# Patient Record
Sex: Female | Born: 1985
Health system: Southern US, Community
[De-identification: ages and names within clinical notes are randomized; demographics above are authoritative.]

## PROBLEM LIST (undated history)

## (undated) DIAGNOSIS — F32A Depression, unspecified: Secondary | ICD-10-CM

## (undated) DIAGNOSIS — J45909 Unspecified asthma, uncomplicated: Secondary | ICD-10-CM

## (undated) DIAGNOSIS — F419 Anxiety disorder, unspecified: Secondary | ICD-10-CM

## (undated) DIAGNOSIS — I10 Essential (primary) hypertension: Secondary | ICD-10-CM

## (undated) DIAGNOSIS — Z915 Personal history of self-harm: Secondary | ICD-10-CM

## (undated) DIAGNOSIS — K828 Other specified diseases of gallbladder: Secondary | ICD-10-CM

## (undated) DIAGNOSIS — R002 Palpitations: Secondary | ICD-10-CM

## (undated) DIAGNOSIS — F4310 Post-traumatic stress disorder, unspecified: Secondary | ICD-10-CM

## (undated) DIAGNOSIS — F909 Attention-deficit hyperactivity disorder, unspecified type: Secondary | ICD-10-CM

## (undated) DIAGNOSIS — F329 Major depressive disorder, single episode, unspecified: Secondary | ICD-10-CM

## (undated) HISTORY — DX: Essential (primary) hypertension: I10

## (undated) HISTORY — PX: EYE SURGERY: SHX253

---
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
## 2006-04-18 DIAGNOSIS — N883 Incompetence of cervix uteri: Secondary | ICD-10-CM

## 2006-10-13 ENCOUNTER — Ambulatory Visit: Payer: Self-pay | Admitting: *Deleted

## 2006-10-13 ENCOUNTER — Inpatient Hospital Stay (HOSPITAL_COMMUNITY): Admission: AD | Admit: 2006-10-13 | Discharge: 2006-10-13 | Payer: Self-pay | Admitting: Obstetrics & Gynecology

## 2006-10-26 ENCOUNTER — Ambulatory Visit: Payer: Self-pay | Admitting: Family Medicine

## 2006-11-09 ENCOUNTER — Ambulatory Visit: Payer: Self-pay | Admitting: Family Medicine

## 2006-11-13 ENCOUNTER — Ambulatory Visit: Payer: Self-pay | Admitting: Obstetrics and Gynecology

## 2006-11-13 ENCOUNTER — Inpatient Hospital Stay (HOSPITAL_COMMUNITY): Admission: AD | Admit: 2006-11-13 | Discharge: 2006-11-13 | Payer: Self-pay | Admitting: Obstetrics and Gynecology

## 2006-11-20 ENCOUNTER — Ambulatory Visit: Payer: Self-pay | Admitting: Obstetrics & Gynecology

## 2006-11-23 ENCOUNTER — Ambulatory Visit: Payer: Self-pay | Admitting: Family Medicine

## 2006-11-30 ENCOUNTER — Ambulatory Visit: Payer: Self-pay | Admitting: Family Medicine

## 2006-12-01 ENCOUNTER — Inpatient Hospital Stay (HOSPITAL_COMMUNITY): Admission: AD | Admit: 2006-12-01 | Discharge: 2006-12-01 | Payer: Self-pay | Admitting: Obstetrics and Gynecology

## 2006-12-01 ENCOUNTER — Ambulatory Visit: Payer: Self-pay | Admitting: *Deleted

## 2006-12-07 ENCOUNTER — Ambulatory Visit: Payer: Self-pay | Admitting: Obstetrics & Gynecology

## 2006-12-09 ENCOUNTER — Ambulatory Visit: Payer: Self-pay | Admitting: *Deleted

## 2006-12-09 ENCOUNTER — Inpatient Hospital Stay (HOSPITAL_COMMUNITY): Admission: AD | Admit: 2006-12-09 | Discharge: 2006-12-09 | Payer: Self-pay | Admitting: Obstetrics & Gynecology

## 2006-12-11 ENCOUNTER — Inpatient Hospital Stay (HOSPITAL_COMMUNITY): Admission: AD | Admit: 2006-12-11 | Discharge: 2006-12-14 | Payer: Self-pay | Admitting: Obstetrics & Gynecology

## 2006-12-12 ENCOUNTER — Ambulatory Visit: Payer: Self-pay | Admitting: *Deleted

## 2007-03-06 ENCOUNTER — Emergency Department (HOSPITAL_COMMUNITY): Admission: EM | Admit: 2007-03-06 | Discharge: 2007-03-06 | Payer: Self-pay | Admitting: *Deleted

## 2007-04-09 ENCOUNTER — Emergency Department (HOSPITAL_COMMUNITY): Admission: EM | Admit: 2007-04-09 | Discharge: 2007-04-10 | Payer: Self-pay | Admitting: Emergency Medicine

## 2007-07-07 ENCOUNTER — Inpatient Hospital Stay (HOSPITAL_COMMUNITY): Admission: AD | Admit: 2007-07-07 | Discharge: 2007-07-07 | Payer: Self-pay | Admitting: Obstetrics & Gynecology

## 2007-11-12 ENCOUNTER — Emergency Department (HOSPITAL_COMMUNITY): Admission: EM | Admit: 2007-11-12 | Discharge: 2007-11-12 | Payer: Self-pay | Admitting: Emergency Medicine

## 2008-06-26 ENCOUNTER — Emergency Department (HOSPITAL_COMMUNITY): Admission: EM | Admit: 2008-06-26 | Discharge: 2008-06-26 | Payer: Self-pay | Admitting: Emergency Medicine

## 2008-11-14 ENCOUNTER — Emergency Department (HOSPITAL_COMMUNITY): Admission: EM | Admit: 2008-11-14 | Discharge: 2008-11-14 | Payer: Self-pay | Admitting: Emergency Medicine

## 2008-12-20 ENCOUNTER — Emergency Department (HOSPITAL_COMMUNITY): Admission: EM | Admit: 2008-12-20 | Discharge: 2008-12-21 | Payer: Self-pay | Admitting: Emergency Medicine

## 2009-01-10 ENCOUNTER — Emergency Department (HOSPITAL_COMMUNITY): Admission: EM | Admit: 2009-01-10 | Discharge: 2009-01-11 | Payer: Self-pay | Admitting: Emergency Medicine

## 2009-01-13 DIAGNOSIS — J45909 Unspecified asthma, uncomplicated: Secondary | ICD-10-CM | POA: Insufficient documentation

## 2009-01-13 DIAGNOSIS — D649 Anemia, unspecified: Secondary | ICD-10-CM

## 2009-01-13 DIAGNOSIS — R55 Syncope and collapse: Secondary | ICD-10-CM | POA: Insufficient documentation

## 2009-01-13 DIAGNOSIS — F3289 Other specified depressive episodes: Secondary | ICD-10-CM | POA: Insufficient documentation

## 2009-01-13 DIAGNOSIS — F329 Major depressive disorder, single episode, unspecified: Secondary | ICD-10-CM | POA: Insufficient documentation

## 2009-01-13 DIAGNOSIS — F1994 Other psychoactive substance use, unspecified with psychoactive substance-induced mood disorder: Secondary | ICD-10-CM | POA: Insufficient documentation

## 2009-01-13 HISTORY — DX: Anemia, unspecified: D64.9

## 2009-01-13 HISTORY — DX: Unspecified asthma, uncomplicated: J45.909

## 2009-03-19 ENCOUNTER — Emergency Department (HOSPITAL_COMMUNITY): Admission: EM | Admit: 2009-03-19 | Discharge: 2009-03-19 | Payer: Self-pay | Admitting: Emergency Medicine

## 2009-07-23 ENCOUNTER — Inpatient Hospital Stay (HOSPITAL_COMMUNITY): Admission: EM | Admit: 2009-07-23 | Discharge: 2009-07-25 | Payer: Self-pay | Admitting: Emergency Medicine

## 2009-07-25 ENCOUNTER — Inpatient Hospital Stay (HOSPITAL_COMMUNITY): Admission: RE | Admit: 2009-07-25 | Discharge: 2009-07-31 | Payer: Self-pay | Admitting: Psychiatry

## 2009-07-25 ENCOUNTER — Ambulatory Visit: Payer: Self-pay | Admitting: Psychiatry

## 2009-11-02 ENCOUNTER — Emergency Department (HOSPITAL_COMMUNITY): Admission: EM | Admit: 2009-11-02 | Discharge: 2009-11-03 | Payer: Self-pay | Admitting: Emergency Medicine

## 2010-01-21 ENCOUNTER — Emergency Department (HOSPITAL_COMMUNITY): Admission: EM | Admit: 2010-01-21 | Discharge: 2010-01-21 | Payer: Self-pay | Admitting: Emergency Medicine

## 2010-07-03 LAB — POCT PREGNANCY, URINE: Preg Test, Ur: NEGATIVE

## 2010-07-06 LAB — VALPROIC ACID LEVEL: Valproic Acid Lvl: 63.2 ug/mL (ref 50.0–100.0)

## 2010-07-07 LAB — URINE MICROSCOPIC-ADD ON

## 2010-07-07 LAB — COMPREHENSIVE METABOLIC PANEL
ALT: 10 U/L (ref 0–35)
ALT: 11 U/L (ref 0–35)
AST: 12 U/L (ref 0–37)
AST: 19 U/L (ref 0–37)
Albumin: 2.9 g/dL — ABNORMAL LOW (ref 3.5–5.2)
Albumin: 3.7 g/dL (ref 3.5–5.2)
Alkaline Phosphatase: 43 U/L (ref 39–117)
Alkaline Phosphatase: 55 U/L (ref 39–117)
BUN: 7 mg/dL (ref 6–23)
BUN: 9 mg/dL (ref 6–23)
CO2: 23 mEq/L (ref 19–32)
CO2: 25 mEq/L (ref 19–32)
Calcium: 8.3 mg/dL — ABNORMAL LOW (ref 8.4–10.5)
Calcium: 9.2 mg/dL (ref 8.4–10.5)
Chloride: 107 mEq/L (ref 96–112)
Chloride: 109 mEq/L (ref 96–112)
Creatinine, Ser: 0.59 mg/dL (ref 0.4–1.2)
Creatinine, Ser: 0.59 mg/dL (ref 0.4–1.2)
GFR calc Af Amer: >60 mL/min (ref >60)
GFR calc Af Amer: >60 mL/min (ref >60)
GFR calc non Af Amer: >60 mL/min (ref >60)
GFR calc non Af Amer: >60 mL/min (ref >60)
Glucose, Bld: 88 mg/dL (ref 70–99)
Glucose, Bld: 93 mg/dL (ref 70–99)
Potassium: 4.2 mEq/L (ref 3.5–5.1)
Potassium: 4.7 mEq/L (ref 3.5–5.1)
Sodium: 137 mEq/L (ref 135–145)
Sodium: 138 mEq/L (ref 135–145)
Total Bilirubin: 0.4 mg/dL (ref 0.3–1.2)
Total Bilirubin: 0.6 mg/dL (ref 0.3–1.2)
Total Protein: 5.8 g/dL — ABNORMAL LOW (ref 6.0–8.3)
Total Protein: 7.4 g/dL (ref 6.0–8.3)

## 2010-07-07 LAB — DIFFERENTIAL
Basophils Absolute: 0 10*3/uL (ref 0.0–0.1)
Basophils Relative: 0 % (ref 0–1)
Eosinophils Absolute: 0.1 10*3/uL (ref 0.0–0.7)
Eosinophils Relative: 1 % (ref 0–5)
Lymphocytes Relative: 60 % — ABNORMAL HIGH (ref 12–46)
Lymphs Abs: 5.9 10*3/uL — ABNORMAL HIGH (ref 0.7–4.0)
Monocytes Absolute: 0.7 10*3/uL (ref 0.1–1.0)
Monocytes Relative: 7 % (ref 3–12)
Neutro Abs: 3.1 10*3/uL (ref 1.7–7.7)
Neutrophils Relative %: 32 % — ABNORMAL LOW (ref 43–77)

## 2010-07-07 LAB — URINALYSIS, ROUTINE W REFLEX MICROSCOPIC
Bilirubin Urine: NEGATIVE
Bilirubin Urine: NEGATIVE
Glucose, UA: NEGATIVE mg/dL
Glucose, UA: NEGATIVE mg/dL
Hgb urine dipstick: NEGATIVE
Hgb urine dipstick: NEGATIVE
Ketones, ur: NEGATIVE mg/dL
Ketones, ur: NEGATIVE mg/dL
Nitrite: NEGATIVE
Nitrite: NEGATIVE
Protein, ur: NEGATIVE mg/dL
Protein, ur: NEGATIVE mg/dL
Specific Gravity, Urine: 1.025 (ref 1.005–1.030)
Specific Gravity, Urine: 1.023 (ref 1.005–1.030)
Urobilinogen, UA: 1 mg/dL (ref 0.0–1.0)
Urobilinogen, UA: 0.2 mg/dL (ref 0.0–1.0)
pH: 7 (ref 5.0–8.0)
pH: 8 (ref 5.0–8.0)

## 2010-07-07 LAB — POCT PREGNANCY, URINE: Preg Test, Ur: NEGATIVE

## 2010-07-07 LAB — CBC
HCT: 35.3 % — ABNORMAL LOW (ref 36.0–46.0)
HCT: 34.9 % — ABNORMAL LOW (ref 36.0–46.0)
Hemoglobin: 11.4 g/dL — ABNORMAL LOW (ref 12.0–15.0)
Hemoglobin: 11.5 g/dL — ABNORMAL LOW (ref 12.0–15.0)
MCHC: 32.2 g/dL (ref 30.0–36.0)
MCHC: 32.8 g/dL (ref 30.0–36.0)
MCV: 87.3 fL (ref 78.0–100.0)
MCV: 86 fL (ref 78.0–100.0)
Platelets: 221 10*3/uL (ref 150–400)
Platelets: 202 10*3/uL (ref 150–400)
RBC: 4.04 MIL/uL (ref 3.87–5.11)
RBC: 4.06 MIL/uL (ref 3.87–5.11)
RDW: 13.3 % (ref 11.5–15.5)
RDW: 13.5 % (ref 11.5–15.5)
WBC: 9.9 10*3/uL (ref 4.0–10.5)
WBC: 9 10*3/uL (ref 4.0–10.5)

## 2010-07-07 LAB — LACTIC ACID, PLASMA: Lactic Acid, Venous: 1.3 mmol/L (ref 0.5–2.2)

## 2010-07-07 LAB — RAPID URINE DRUG SCREEN, HOSP PERFORMED
Amphetamines: NOT DETECTED
Barbiturates: NOT DETECTED
Benzodiazepines: NOT DETECTED
Cocaine: NOT DETECTED
Opiates: NOT DETECTED
Tetrahydrocannabinol: NOT DETECTED

## 2010-07-07 LAB — T4, FREE: Free T4: 1.09 ng/dL (ref 0.80–1.80)

## 2010-07-07 LAB — VALPROIC ACID LEVEL
Valproic Acid Lvl: 57.3 ug/mL (ref 50.0–100.0)
Valproic Acid Lvl: 63.5 ug/mL (ref 50.0–100.0)
Valproic Acid Lvl: 27.8 ug/mL — ABNORMAL LOW (ref 50.0–100.0)

## 2010-07-07 LAB — RPR: RPR Ser Ql: NONREACTIVE

## 2010-07-07 LAB — ACETAMINOPHEN LEVEL: Acetaminophen (Tylenol), Serum: <10 ug/mL — ABNORMAL LOW (ref 10–30)

## 2010-07-07 LAB — PREGNANCY, URINE: Preg Test, Ur: NEGATIVE

## 2010-07-07 LAB — CK: Total CK: 159 U/L (ref 7–177)

## 2010-07-07 LAB — TSH: TSH: 5.555 u[IU]/mL — ABNORMAL HIGH (ref 0.350–4.500)

## 2010-07-23 LAB — DIFFERENTIAL
Basophils Absolute: 0 10*3/uL (ref 0.0–0.1)
Basophils Absolute: 0 10*3/uL (ref 0.0–0.1)
Basophils Relative: 0 % (ref 0–1)
Basophils Relative: 0 % (ref 0–1)
Eosinophils Absolute: 0.3 10*3/uL (ref 0.0–0.7)
Eosinophils Absolute: 0.3 10*3/uL (ref 0.0–0.7)
Eosinophils Relative: 3 % (ref 0–5)
Eosinophils Relative: 4 % (ref 0–5)
Lymphocytes Relative: 52 % — ABNORMAL HIGH (ref 12–46)
Lymphocytes Relative: 52 % — ABNORMAL HIGH (ref 12–46)
Lymphs Abs: 5.1 10*3/uL — ABNORMAL HIGH (ref 0.7–4.0)
Lymphs Abs: 3.2 10*3/uL (ref 0.7–4.0)
Monocytes Absolute: 0.8 10*3/uL (ref 0.1–1.0)
Monocytes Absolute: 1 10*3/uL (ref 0.1–1.0)
Monocytes Relative: 8 % (ref 3–12)
Monocytes Relative: 16 % — ABNORMAL HIGH (ref 3–12)
Neutro Abs: 3.6 10*3/uL (ref 1.7–7.7)
Neutro Abs: 1.7 10*3/uL (ref 1.7–7.7)
Neutrophils Relative %: 36 % — ABNORMAL LOW (ref 43–77)
Neutrophils Relative %: 28 % — ABNORMAL LOW (ref 43–77)

## 2010-07-23 LAB — URINALYSIS, ROUTINE W REFLEX MICROSCOPIC
Bilirubin Urine: NEGATIVE
Glucose, UA: NEGATIVE mg/dL
Hgb urine dipstick: NEGATIVE
Ketones, ur: NEGATIVE mg/dL
Nitrite: NEGATIVE
Protein, ur: NEGATIVE mg/dL
Specific Gravity, Urine: 1.035 — ABNORMAL HIGH (ref 1.005–1.030)
Urobilinogen, UA: 1 mg/dL (ref 0.0–1.0)
pH: 6 (ref 5.0–8.0)

## 2010-07-23 LAB — HEMOCCULT GUIAC POC 1CARD (OFFICE)
Fecal Occult Bld: NEGATIVE
Fecal Occult Bld: POSITIVE

## 2010-07-23 LAB — POCT I-STAT, CHEM 8
BUN: 19 mg/dL (ref 6–23)
BUN: 18 mg/dL (ref 6–23)
Calcium, Ion: 1.08 mmol/L — ABNORMAL LOW (ref 1.12–1.32)
Calcium, Ion: 1.14 mmol/L (ref 1.12–1.32)
Chloride: 107 mEq/L (ref 96–112)
Chloride: 101 mEq/L (ref 96–112)
Creatinine, Ser: 0.7 mg/dL (ref 0.4–1.2)
Creatinine, Ser: 0.9 mg/dL (ref 0.4–1.2)
Glucose, Bld: 89 mg/dL (ref 70–99)
Glucose, Bld: 81 mg/dL (ref 70–99)
HCT: 36 % (ref 36.0–46.0)
HCT: 34 % — ABNORMAL LOW (ref 36.0–46.0)
Hemoglobin: 12.2 g/dL (ref 12.0–15.0)
Hemoglobin: 11.6 g/dL — ABNORMAL LOW (ref 12.0–15.0)
Potassium: 3.8 mEq/L (ref 3.5–5.1)
Potassium: 4.3 mEq/L (ref 3.5–5.1)
Sodium: 137 mEq/L (ref 135–145)
Sodium: 136 mEq/L (ref 135–145)
TCO2: 23 mmol/L (ref 0–100)
TCO2: 26 mmol/L (ref 0–100)

## 2010-07-23 LAB — TYPE AND SCREEN
ABO/RH(D): A POS
Antibody Screen: NEGATIVE

## 2010-07-23 LAB — CBC
HCT: 33.8 % — ABNORMAL LOW (ref 36.0–46.0)
HCT: 32.6 % — ABNORMAL LOW (ref 36.0–46.0)
Hemoglobin: 11.4 g/dL — ABNORMAL LOW (ref 12.0–15.0)
Hemoglobin: 10.8 g/dL — ABNORMAL LOW (ref 12.0–15.0)
MCHC: 33.8 g/dL (ref 30.0–36.0)
MCHC: 33 g/dL (ref 30.0–36.0)
MCV: 87.5 fL (ref 78.0–100.0)
MCV: 86.5 fL (ref 78.0–100.0)
Platelets: 175 10*3/uL (ref 150–400)
Platelets: 142 10*3/uL — ABNORMAL LOW (ref 150–400)
RBC: 3.86 MIL/uL — ABNORMAL LOW (ref 3.87–5.11)
RBC: 3.77 MIL/uL — ABNORMAL LOW (ref 3.87–5.11)
RDW: 14.2 % (ref 11.5–15.5)
RDW: 14.4 % (ref 11.5–15.5)
WBC: 9.8 10*3/uL (ref 4.0–10.5)
WBC: 6.1 10*3/uL (ref 4.0–10.5)

## 2010-07-23 LAB — ABO/RH: ABO/RH(D): A POS

## 2010-07-23 LAB — URINE MICROSCOPIC-ADD ON

## 2010-07-23 LAB — VALPROIC ACID LEVEL: Valproic Acid Lvl: 51.8 ug/mL (ref 50.0–100.0)

## 2010-07-23 LAB — POCT PREGNANCY, URINE: Preg Test, Ur: NEGATIVE
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
## 2011-01-10 LAB — POCT PREGNANCY, URINE
Operator id: 288861
Preg Test, Ur: NEGATIVE

## 2011-01-14 LAB — POCT I-STAT, CHEM 8
BUN: 9
Calcium, Ion: 1.15
Chloride: 105
Creatinine, Ser: 0.8
Glucose, Bld: 70
HCT: 38
Hemoglobin: 12.9
Potassium: 4.2
Sodium: 140
TCO2: 25

## 2011-01-14 LAB — URINALYSIS, ROUTINE W REFLEX MICROSCOPIC
Bilirubin Urine: NEGATIVE
Glucose, UA: NEGATIVE
Ketones, ur: NEGATIVE
Nitrite: NEGATIVE
Protein, ur: 30 — AB
Specific Gravity, Urine: 1.015
Urobilinogen, UA: 1
pH: 6

## 2011-01-14 LAB — WET PREP, GENITAL
Clue Cells Wet Prep HPF POC: NONE SEEN
Trich, Wet Prep: NONE SEEN
WBC, Wet Prep HPF POC: NONE SEEN
Yeast Wet Prep HPF POC: NONE SEEN

## 2011-01-14 LAB — URINE MICROSCOPIC-ADD ON

## 2011-01-14 LAB — GC/CHLAMYDIA PROBE AMP, GENITAL
Chlamydia, DNA Probe: NEGATIVE
GC Probe Amp, Genital: NEGATIVE

## 2011-01-14 LAB — POCT PREGNANCY, URINE: Preg Test, Ur: NEGATIVE

## 2011-01-21 LAB — I-STAT 8, (EC8 V) (CONVERTED LAB)
Acid-base deficit: 2
BUN: 12
Bicarbonate: 24.9 — ABNORMAL HIGH
Chloride: 108
Glucose, Bld: 74
HCT: 39
Hemoglobin: 13.3
Operator id: 257131
Potassium: 4.1
Sodium: 139
TCO2: 26
pCO2, Ven: 48.5
pH, Ven: 7.319 — ABNORMAL HIGH

## 2011-01-21 LAB — POCT I-STAT CREATININE
Creatinine, Ser: 0.8
Operator id: 257131

## 2011-01-21 LAB — RAPID STREP SCREEN (MED CTR MEBANE ONLY): Streptococcus, Group A Screen (Direct): NEGATIVE

## 2011-01-21 LAB — POCT PREGNANCY, URINE
Operator id: 257131
Preg Test, Ur: NEGATIVE

## 2011-01-28 LAB — POCT URINALYSIS DIP (DEVICE)
Bilirubin Urine: NEGATIVE
Glucose, UA: NEGATIVE
Hgb urine dipstick: NEGATIVE
Ketones, ur: NEGATIVE
Nitrite: NEGATIVE
Operator id: 297281
Specific Gravity, Urine: 1.025
Urobilinogen, UA: 0.2
pH: 6.5

## 2011-01-28 LAB — URINALYSIS, ROUTINE W REFLEX MICROSCOPIC
Bilirubin Urine: NEGATIVE
Bilirubin Urine: NEGATIVE
Bilirubin Urine: NEGATIVE
Glucose, UA: NEGATIVE
Glucose, UA: NEGATIVE
Glucose, UA: NEGATIVE
Hgb urine dipstick: NEGATIVE
Hgb urine dipstick: NEGATIVE
Hgb urine dipstick: NEGATIVE
Ketones, ur: 15 — AB
Ketones, ur: NEGATIVE
Ketones, ur: NEGATIVE
Nitrite: NEGATIVE
Nitrite: NEGATIVE
Nitrite: NEGATIVE
Protein, ur: NEGATIVE
Protein, ur: NEGATIVE
Protein, ur: NEGATIVE
Specific Gravity, Urine: 1.015
Specific Gravity, Urine: 1.025
Specific Gravity, Urine: <1.005 — ABNORMAL LOW
Urobilinogen, UA: 0.2
Urobilinogen, UA: 0.2
Urobilinogen, UA: 0.2
pH: 6
pH: 6.5
pH: 6.5

## 2011-01-28 LAB — CBC
HCT: 31.8 — ABNORMAL LOW
Hemoglobin: 10.5 — ABNORMAL LOW
MCHC: 33
MCV: 79.9
Platelets: 217
RBC: 3.98
RDW: 13.7
WBC: 8.7

## 2011-01-28 LAB — RPR: RPR Ser Ql: NONREACTIVE

## 2011-01-31 LAB — URINE MICROSCOPIC-ADD ON

## 2011-01-31 LAB — POCT URINALYSIS DIP (DEVICE)
Bilirubin Urine: NEGATIVE
Bilirubin Urine: NEGATIVE
Bilirubin Urine: NEGATIVE
Glucose, UA: NEGATIVE
Glucose, UA: NEGATIVE
Glucose, UA: NEGATIVE
Glucose, UA: NEGATIVE
Hgb urine dipstick: NEGATIVE
Hgb urine dipstick: NEGATIVE
Hgb urine dipstick: NEGATIVE
Hgb urine dipstick: NEGATIVE
Ketones, ur: NEGATIVE
Ketones, ur: NEGATIVE
Ketones, ur: NEGATIVE
Nitrite: NEGATIVE
Nitrite: NEGATIVE
Nitrite: NEGATIVE
Nitrite: NEGATIVE
Operator id: 120861
Operator id: 148111
Operator id: 148111
Operator id: 200901
Protein, ur: 30 — AB
Protein, ur: NEGATIVE
Protein, ur: NEGATIVE
Protein, ur: NEGATIVE
Specific Gravity, Urine: 1.015
Specific Gravity, Urine: 1.025
Specific Gravity, Urine: 1.025
Specific Gravity, Urine: 1.02
Urobilinogen, UA: 0.2
Urobilinogen, UA: 1
Urobilinogen, UA: 2 — ABNORMAL HIGH
Urobilinogen, UA: 1
pH: 6
pH: 7
pH: 7.5
pH: 7

## 2011-01-31 LAB — URINE CULTURE: Colony Count: 40000

## 2011-01-31 LAB — FETAL FIBRONECTIN: Fetal Fibronectin: NEGATIVE

## 2011-01-31 LAB — URINALYSIS, ROUTINE W REFLEX MICROSCOPIC
Bilirubin Urine: NEGATIVE
Glucose, UA: NEGATIVE
Hgb urine dipstick: NEGATIVE
Ketones, ur: NEGATIVE
Nitrite: NEGATIVE
Protein, ur: NEGATIVE
Specific Gravity, Urine: 1.025
Urobilinogen, UA: 0.2
pH: 6

## 2011-02-01 LAB — POCT URINALYSIS DIP (DEVICE)
Bilirubin Urine: NEGATIVE
Glucose, UA: NEGATIVE
Hgb urine dipstick: NEGATIVE
Ketones, ur: NEGATIVE
Nitrite: NEGATIVE
Operator id: 148111
Protein, ur: NEGATIVE
Specific Gravity, Urine: 1.02
Urobilinogen, UA: 1
pH: 7

## 2011-02-02 LAB — URINALYSIS, ROUTINE W REFLEX MICROSCOPIC
Bilirubin Urine: NEGATIVE
Glucose, UA: NEGATIVE
Hgb urine dipstick: NEGATIVE
Ketones, ur: NEGATIVE
Nitrite: NEGATIVE
Protein, ur: NEGATIVE
Specific Gravity, Urine: 1.01
Urobilinogen, UA: 0.2
pH: 7

## 2011-02-02 LAB — WET PREP, GENITAL
Trich, Wet Prep: NONE SEEN
Yeast Wet Prep HPF POC: NONE SEEN

## 2011-02-02 LAB — GC/CHLAMYDIA PROBE AMP, GENITAL
Chlamydia, DNA Probe: NEGATIVE
GC Probe Amp, Genital: NEGATIVE

## 2017-04-18 DIAGNOSIS — Z9151 Personal history of suicidal behavior: Secondary | ICD-10-CM

## 2017-04-18 HISTORY — DX: Personal history of suicidal behavior: Z91.51

## 2017-07-10 ENCOUNTER — Emergency Department (HOSPITAL_COMMUNITY)
Admission: EM | Admit: 2017-07-10 | Discharge: 2017-07-10 | Disposition: A | Discharge status: Home / Self Care | Payer: Medicaid Other | Attending: Emergency Medicine | Admitting: Emergency Medicine

## 2017-07-10 ENCOUNTER — Encounter (HOSPITAL_COMMUNITY): Payer: Self-pay | Admitting: Emergency Medicine

## 2017-07-10 ENCOUNTER — Other Ambulatory Visit: Payer: Self-pay

## 2017-07-10 DIAGNOSIS — R002 Palpitations: Secondary | ICD-10-CM | POA: Insufficient documentation

## 2017-07-10 DIAGNOSIS — Z5321 Procedure and treatment not carried out due to patient leaving prior to being seen by health care provider: Secondary | ICD-10-CM | POA: Diagnosis not present

## 2017-07-10 LAB — BASIC METABOLIC PANEL
Anion gap: 11 (ref 5–15)
BUN: 6 mg/dL (ref 6–20)
CO2: 22 mmol/L (ref 22–32)
Calcium: 9 mg/dL (ref 8.9–10.3)
Chloride: 104 mmol/L (ref 101–111)
Creatinine, Ser: 0.59 mg/dL (ref 0.44–1.00)
GFR calc Af Amer: >60 mL/min (ref >60)
GFR calc non Af Amer: >60 mL/min (ref >60)
Glucose, Bld: 95 mg/dL (ref 65–99)
Potassium: 3.5 mmol/L (ref 3.5–5.1)
Sodium: 137 mmol/L (ref 135–145)

## 2017-07-10 LAB — CBC
HCT: 35.7 % — ABNORMAL LOW (ref 36.0–46.0)
Hemoglobin: 11.7 g/dL — ABNORMAL LOW (ref 12.0–15.0)
MCH: 27.7 pg (ref 26.0–34.0)
MCHC: 32.8 g/dL (ref 30.0–36.0)
MCV: 84.4 fL (ref 78.0–100.0)
Platelets: 214 10*3/uL (ref 150–400)
RBC: 4.23 MIL/uL (ref 3.87–5.11)
RDW: 13.3 % (ref 11.5–15.5)
WBC: 5.4 10*3/uL (ref 4.0–10.5)

## 2017-07-10 LAB — I-STAT BETA HCG BLOOD, ED (MC, WL, AP ONLY): I-stat hCG, quantitative: <5 m[IU]/mL (ref <5)

## 2017-07-10 NOTE — ED Triage Notes (Signed)
Pt to ER reports palpitations and shortness of breath onset Friday, states generalized body aches and cough with cp since Saturday. In NAD.

## 2017-11-12 ENCOUNTER — Emergency Department (HOSPITAL_COMMUNITY)
Admission: EM | Admit: 2017-11-12 | Discharge: 2017-11-13 | Disposition: A | Discharge status: Other Acute Inpatient Hospital | Payer: Medicaid Other | Attending: Emergency Medicine | Admitting: Emergency Medicine

## 2017-11-12 ENCOUNTER — Encounter (HOSPITAL_COMMUNITY): Payer: Self-pay | Admitting: *Deleted

## 2017-11-12 ENCOUNTER — Other Ambulatory Visit: Payer: Self-pay

## 2017-11-12 DIAGNOSIS — F332 Major depressive disorder, recurrent severe without psychotic features: Secondary | ICD-10-CM | POA: Insufficient documentation

## 2017-11-12 DIAGNOSIS — J45909 Unspecified asthma, uncomplicated: Secondary | ICD-10-CM | POA: Insufficient documentation

## 2017-11-12 DIAGNOSIS — T43502A Poisoning by unspecified antipsychotics and neuroleptics, intentional self-harm, initial encounter: Secondary | ICD-10-CM | POA: Diagnosis present

## 2017-11-12 DIAGNOSIS — F909 Attention-deficit hyperactivity disorder, unspecified type: Secondary | ICD-10-CM | POA: Diagnosis not present

## 2017-11-12 HISTORY — DX: Major depressive disorder, single episode, unspecified: F32.9

## 2017-11-12 HISTORY — DX: Depression, unspecified: F32.A

## 2017-11-12 HISTORY — DX: Unspecified asthma, uncomplicated: J45.909

## 2017-11-12 HISTORY — DX: Attention-deficit hyperactivity disorder, unspecified type: F90.9

## 2017-11-12 LAB — COMPREHENSIVE METABOLIC PANEL
ALT: 11 U/L (ref 0–44)
AST: 13 U/L — ABNORMAL LOW (ref 15–41)
Albumin: 3 g/dL — ABNORMAL LOW (ref 3.5–5.0)
Alkaline Phosphatase: 35 U/L — ABNORMAL LOW (ref 38–126)
Anion gap: 8 (ref 5–15)
BUN: 9 mg/dL (ref 6–20)
CO2: 23 mmol/L (ref 22–32)
Calcium: 8.6 mg/dL — ABNORMAL LOW (ref 8.9–10.3)
Chloride: 114 mmol/L — ABNORMAL HIGH (ref 98–111)
Creatinine, Ser: 0.64 mg/dL (ref 0.44–1.00)
GFR calc Af Amer: >60 mL/min (ref >60)
GFR calc non Af Amer: >60 mL/min (ref >60)
Glucose, Bld: 125 mg/dL — ABNORMAL HIGH (ref 70–99)
Potassium: 3.8 mmol/L (ref 3.5–5.1)
Sodium: 145 mmol/L (ref 135–145)
Total Bilirubin: 0.3 mg/dL (ref 0.3–1.2)
Total Protein: 5.6 g/dL — ABNORMAL LOW (ref 6.5–8.1)

## 2017-11-12 LAB — RAPID URINE DRUG SCREEN, HOSP PERFORMED
Amphetamines: POSITIVE — AB
Barbiturates: NOT DETECTED
Benzodiazepines: NOT DETECTED
Cocaine: NOT DETECTED
Opiates: NOT DETECTED
Tetrahydrocannabinol: POSITIVE — AB

## 2017-11-12 LAB — SALICYLATE LEVEL: Salicylate Lvl: <7 mg/dL (ref 2.8–30.0)

## 2017-11-12 LAB — CBG MONITORING, ED: Glucose-Capillary: 127 mg/dL — ABNORMAL HIGH (ref 70–99)

## 2017-11-12 LAB — ACETAMINOPHEN LEVEL: Acetaminophen (Tylenol), Serum: <10 ug/mL — ABNORMAL LOW (ref 10–30)

## 2017-11-12 LAB — PREGNANCY, URINE: Preg Test, Ur: NEGATIVE

## 2017-11-12 LAB — ETHANOL: Alcohol, Ethyl (B): <10 mg/dL (ref <10)

## 2017-11-12 MED ORDER — FLUOXETINE HCL 20 MG PO CAPS
20.0000 mg | ORAL_CAPSULE | Freq: Every day | ORAL | Status: DC
  Administered 2017-11-13: 20 mg via ORAL
  Filled 2017-11-12: qty 1

## 2017-11-12 MED ORDER — AMLODIPINE BESYLATE 5 MG PO TABS
5.0000 mg | ORAL_TABLET | Freq: Every day | ORAL | Status: DC
  Administered 2017-11-13: 5 mg via ORAL
  Filled 2017-11-12: qty 1

## 2017-11-12 MED ORDER — MOMETASONE FURO-FORMOTEROL FUM 100-5 MCG/ACT IN AERO
2.0000 | INHALATION_SPRAY | Freq: Two times a day (BID) | RESPIRATORY_TRACT | Status: DC
  Administered 2017-11-13: 2 via RESPIRATORY_TRACT
  Filled 2017-11-12: qty 8.8

## 2017-11-12 MED ORDER — AMPHETAMINE-DEXTROAMPHETAMINE 20 MG PO TABS: 20.0000 mg | ORAL_TABLET | Freq: Every day | ORAL | Status: DC

## 2017-11-12 NOTE — ED Notes (Signed)
Bed: RESA Expected date:  Expected time:  Means of arrival:  Comments:  od 

## 2017-11-12 NOTE — ED Triage Notes (Signed)
EMS reports pt took Seroquel in attempt to kill self after her boyfriend broke up with her and suggested she kill herself. Pt responds to painful stimuli. Unknown time of ingestion and unknown amount

## 2017-11-12 NOTE — ED Notes (Signed)
[facility] Expected date:  Expected time:  Means of arrival:  Comments: Room 4
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
## 2017-11-12 NOTE — ED Notes (Signed)
[facility] [employer]- Arlene [phone]

## 2017-11-12 NOTE — ED Notes (Signed)
Poison control recommends 6 hr monitoring with EKG around 10 pm to access for QT changes. Poison control to call back to obtain lab results and pt status. Dr Denton LankSteinl made aware of recommendations

## 2017-11-13 ENCOUNTER — Inpatient Hospital Stay (HOSPITAL_COMMUNITY)
Admission: AD | Admit: 2017-11-13 | Discharge: 2017-11-16 | DRG: 885 | Disposition: A | Discharge status: Home / Self Care | Payer: Medicaid Other | Source: Intra-hospital | Attending: Psychiatry | Admitting: Psychiatry

## 2017-11-13 ENCOUNTER — Other Ambulatory Visit: Payer: Self-pay

## 2017-11-13 ENCOUNTER — Encounter (HOSPITAL_COMMUNITY): Payer: Self-pay

## 2017-11-13 DIAGNOSIS — F329 Major depressive disorder, single episode, unspecified: Secondary | ICD-10-CM | POA: Diagnosis present

## 2017-11-13 DIAGNOSIS — R45851 Suicidal ideations: Secondary | ICD-10-CM | POA: Diagnosis present

## 2017-11-13 DIAGNOSIS — Z91013 Allergy to seafood: Secondary | ICD-10-CM

## 2017-11-13 DIAGNOSIS — F332 Major depressive disorder, recurrent severe without psychotic features: Principal | ICD-10-CM | POA: Diagnosis present

## 2017-11-13 DIAGNOSIS — Z91410 Personal history of adult physical and sexual abuse: Secondary | ICD-10-CM | POA: Diagnosis not present

## 2017-11-13 DIAGNOSIS — J45909 Unspecified asthma, uncomplicated: Secondary | ICD-10-CM | POA: Diagnosis present

## 2017-11-13 DIAGNOSIS — F17210 Nicotine dependence, cigarettes, uncomplicated: Secondary | ICD-10-CM | POA: Diagnosis not present

## 2017-11-13 DIAGNOSIS — F419 Anxiety disorder, unspecified: Secondary | ICD-10-CM | POA: Diagnosis not present

## 2017-11-13 DIAGNOSIS — F1290 Cannabis use, unspecified, uncomplicated: Secondary | ICD-10-CM | POA: Diagnosis present

## 2017-11-13 DIAGNOSIS — T43502A Poisoning by unspecified antipsychotics and neuroleptics, intentional self-harm, initial encounter: Secondary | ICD-10-CM | POA: Diagnosis not present

## 2017-11-13 DIAGNOSIS — F4310 Post-traumatic stress disorder, unspecified: Secondary | ICD-10-CM | POA: Diagnosis present

## 2017-11-13 DIAGNOSIS — Z818 Family history of other mental and behavioral disorders: Secondary | ICD-10-CM

## 2017-11-13 DIAGNOSIS — F1994 Other psychoactive substance use, unspecified with psychoactive substance-induced mood disorder: Secondary | ICD-10-CM | POA: Diagnosis present

## 2017-11-13 DIAGNOSIS — F909 Attention-deficit hyperactivity disorder, unspecified type: Secondary | ICD-10-CM | POA: Diagnosis present

## 2017-11-13 DIAGNOSIS — Z62810 Personal history of physical and sexual abuse in childhood: Secondary | ICD-10-CM | POA: Diagnosis not present

## 2017-11-13 DIAGNOSIS — T43592A Poisoning by other antipsychotics and neuroleptics, intentional self-harm, initial encounter: Secondary | ICD-10-CM | POA: Diagnosis not present

## 2017-11-13 DIAGNOSIS — F69 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: Secondary | ICD-10-CM

## 2017-11-13 DIAGNOSIS — Z7951 Long term (current) use of inhaled steroids: Secondary | ICD-10-CM | POA: Diagnosis not present

## 2017-11-13 DIAGNOSIS — G4700 Insomnia, unspecified: Secondary | ICD-10-CM | POA: Diagnosis present

## 2017-11-13 DIAGNOSIS — F17200 Nicotine dependence, unspecified, uncomplicated: Secondary | ICD-10-CM | POA: Diagnosis not present

## 2017-11-13 DIAGNOSIS — Z79899 Other long term (current) drug therapy: Secondary | ICD-10-CM | POA: Diagnosis not present

## 2017-11-13 DIAGNOSIS — F3181 Bipolar II disorder: Secondary | ICD-10-CM | POA: Diagnosis not present

## 2017-11-13 DIAGNOSIS — Z915 Personal history of self-harm: Secondary | ICD-10-CM

## 2017-11-13 MED ORDER — MAGNESIUM HYDROXIDE 400 MG/5ML PO SUSP: 30.0000 mL | Freq: Every day | ORAL | Status: DC | PRN

## 2017-11-13 MED ORDER — MOMETASONE FURO-FORMOTEROL FUM 100-5 MCG/ACT IN AERO
2.0000 | INHALATION_SPRAY | Freq: Two times a day (BID) | RESPIRATORY_TRACT | Status: DC
  Administered 2017-11-14 – 2017-11-16 (×3): 2 via RESPIRATORY_TRACT
  Filled 2017-11-13: qty 8.8

## 2017-11-13 MED ORDER — AMLODIPINE BESYLATE 5 MG PO TABS
5.0000 mg | ORAL_TABLET | Freq: Every day | ORAL | Status: DC
  Administered 2017-11-14 – 2017-11-16 (×3): 5 mg via ORAL
  Filled 2017-11-13 (×5): qty 1

## 2017-11-13 MED ORDER — ALUM & MAG HYDROXIDE-SIMETH 200-200-20 MG/5ML PO SUSP: 30.0000 mL | ORAL | Status: DC | PRN

## 2017-11-13 MED ORDER — FLUOXETINE HCL 20 MG PO CAPS
20.0000 mg | ORAL_CAPSULE | Freq: Every day | ORAL | Status: DC
  Administered 2017-11-14: 20 mg via ORAL
  Filled 2017-11-13 (×3): qty 1

## 2017-11-13 MED ORDER — ACETAMINOPHEN 325 MG PO TABS: 650.0000 mg | ORAL_TABLET | Freq: Four times a day (QID) | ORAL | Status: DC | PRN

## 2017-11-13 NOTE — BH Assessment (Addendum)
[facility]  Per Juanetta BeetsJacqueline Norman, DO, this pt requires psychiatric hospitalization.  xxx, RN, AC [facility]; [facility] will be ready to receive pt at 14:00.  Dr Sharma CovertNorman also finds that pt meets criteria for IVC, which she has initiated.  [place]C [place], and at [employer]13:12 Magistrate Antonelli confirms receipt.  She has since faxed Findings and Custody Order to this Clinical research associatewriter.  At 13:16 I called [employer] and spoke to Arwyn Besaw Supplyperator 1775, who took demographic information, agreeing to dispatch law enforcement to fill out Return of Service.  As of this writing, arrival of law enforcement to complete Return of Service is pending.  Pt's nurse, Kendal Hymendie, has been notified, and agrees to call report to [phone].  Pt is to be transported via Patent examinerlaw enforcement.   Doylene Canninghomas Anthonymichael Munday, [place] Behavioral Health Coordinator [phone]   Addendum:  Conni ElliotLaw enforcement has completed Return of Service, and [facility].  Kendal Hymendie has been notified.  Doylene Canninghomas Varetta Chavers, [place] Behavioral Health Coordinator [phone]

## 2017-11-13 NOTE — ED Notes (Signed)
Patient arrived to unit and is pleasant and cooperative with care. Patient states "I didn't try to kill myself, I only wanted to go to sleep all night so I wouldn't be upset. I have to go to my new job in the morning; its my second day". Patient denies suicidal or homicidal ideations at this time. No distress noted currently. Patient given a sandwich and soda upon request.   Poison Control called unit to inquire about patient. No further requests made from them at this time. Pt cleared by them.

## 2017-11-13 NOTE — Plan of Care (Signed)
D: Pt stated" I don't want to be bothered" . Pt had a blanket from EMS in her room and was informed she could not have it on the unit , pt stated " well take it then" . Pt very stand-offish and irritable this evening would not allow writer to introduce himself . Pt forwarded no information this evening and would not answer questions at this time A: Q 15 minute checks were done for safety.   R: safety maintained on unit.   Problem: Activity: Goal: Sleeping patterns will improve Outcome: Progressing

## 2017-11-13 NOTE — ED Notes (Signed)
Pt discharged safely with [employer].  Pt was calm and cooperative.  All belongings were sent with patient. 
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
## 2017-11-13 NOTE — Plan of Care (Signed)
Pt progressing in the following metrics  Problem: Education: Goal: Knowledge of [facility] Education information/materials will improve Outcome: Progressing   Problem: Safety: Goal: Periods of time without injury will increase Outcome: Progressing   Problem: Education: Goal: Knowledge of the prescribed therapeutic regimen will improve Outcome: Progressing   Problem: Safety: Goal: Ability to disclose and discuss suicidal ideas will improve Outcome: Progressing

## 2017-11-13 NOTE — Tx Team (Signed)
Initial Treatment Plan 11/13/2017 5:54 PM Caroline Ingram LKG:[id number]    PATIENT STRESSORS: Financial difficulties Loss of aunt Marital or family conflict Occupational concerns   PATIENT STRENGTHS: Ability for insight Average or above average intelligence Communication skills Motivation for treatment/growth Physical Health Supportive family/friends   PATIENT IDENTIFIED PROBLEMS: "I want to work on coping skills for anger"  "I panic a lot. I need to change that"                   DISCHARGE CRITERIA:  Ability to meet basic life and health needs Improved stabilization in mood, thinking, and/or behavior Reduction of life-threatening or endangering symptoms to within safe limits  PRELIMINARY DISCHARGE PLAN: Return to previous living arrangement  PATIENT/FAMILY INVOLVEMENT: This treatment plan has been presented to and reviewed with the patient, Caroline Ingram.  The patient and family have been given the opportunity to ask questions and make suggestions.  Raylene MiyamotoMichael R Maribell Demeo, RN 11/13/2017, 5:54 PM
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
## 2017-11-13 NOTE — BH Assessment (Addendum)
Donell SievertSpencer Simon, PA, recommended overnight observation for stabilization and safety concerns. Re-evaluate in the a.m by psychiatry. Dr. Read DriversMolpus and RN aware of disposition.

## 2017-11-14 DIAGNOSIS — F909 Attention-deficit hyperactivity disorder, unspecified type: Secondary | ICD-10-CM

## 2017-11-14 DIAGNOSIS — Z62810 Personal history of physical and sexual abuse in childhood: Secondary | ICD-10-CM

## 2017-11-14 DIAGNOSIS — F69 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: Secondary | ICD-10-CM

## 2017-11-14 DIAGNOSIS — F17200 Nicotine dependence, unspecified, uncomplicated: Secondary | ICD-10-CM

## 2017-11-14 DIAGNOSIS — F3181 Bipolar II disorder: Secondary | ICD-10-CM

## 2017-11-14 DIAGNOSIS — Z79899 Other long term (current) drug therapy: Secondary | ICD-10-CM

## 2017-11-14 DIAGNOSIS — T43592A Poisoning by other antipsychotics and neuroleptics, intentional self-harm, initial encounter: Secondary | ICD-10-CM

## 2017-11-14 DIAGNOSIS — F4310 Post-traumatic stress disorder, unspecified: Secondary | ICD-10-CM

## 2017-11-14 MED ORDER — ARIPIPRAZOLE ER 400 MG IM SRER
400.0000 mg | INTRAMUSCULAR | Status: DC
  Filled 2017-11-14: qty 2

## 2017-11-14 MED ORDER — HYDROXYZINE HCL 25 MG PO TABS
25.0000 mg | ORAL_TABLET | Freq: Three times a day (TID) | ORAL | Status: DC | PRN
  Administered 2017-11-14 – 2017-11-15 (×2): 25 mg via ORAL
  Filled 2017-11-14 (×2): qty 1

## 2017-11-14 MED ORDER — NICOTINE 21 MG/24HR TD PT24
21.0000 mg | MEDICATED_PATCH | Freq: Every day | TRANSDERMAL | Status: DC
  Administered 2017-11-15: 21 mg via TRANSDERMAL
  Filled 2017-11-14 (×5): qty 1

## 2017-11-14 MED ORDER — ARIPIPRAZOLE 10 MG PO TABS
10.0000 mg | ORAL_TABLET | ORAL | Status: DC
  Administered 2017-11-14 – 2017-11-16 (×3): 10 mg via ORAL
  Filled 2017-11-14 (×7): qty 1

## 2017-11-14 MED ORDER — FLUOXETINE HCL 20 MG PO CAPS
40.0000 mg | ORAL_CAPSULE | Freq: Every day | ORAL | Status: DC
  Administered 2017-11-15 – 2017-11-16 (×2): 40 mg via ORAL
  Filled 2017-11-14 (×4): qty 2

## 2017-11-14 MED ORDER — TRAZODONE HCL 100 MG PO TABS
100.0000 mg | ORAL_TABLET | Freq: Every day | ORAL | Status: DC
  Administered 2017-11-14 – 2017-11-15 (×2): 100 mg via ORAL
  Filled 2017-11-14 (×5): qty 1
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
## 2017-11-14 NOTE — [facility] Suicide Risk Assessment (Signed)
[facility] INPATIENT:  Family/Significant Other Suicide Prevention Education  Suicide Prevention Education:  Patient Refusal for Family/Significant Other Suicide Prevention Education: The [name] has refused to provide written consent for family/significant other to be provided Family/Significant Other Suicide Prevention Education during admission and/or prior to discharge.  Physician notified.  SPE completed with pt, as pt refused to consent to family contact. SPI pamphlet provided to pt and pt was encouraged to share information with support network, ask questions, and talk about any concerns relating to SPE. Pt denies access to guns/firearms and verbalized understanding of information provided. Mobile Crisis information also provided to pt.   Rona RavensHeather S Sloka Volante LCSW 11/14/2017, 2:25 PM
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
## 2017-11-14 NOTE — Progress Notes (Signed)
Recreation Therapy Notes  Animal-Assisted Activity (AAA) Program Checklist/Progress Notes Patient Eligibility Criteria Checklist & Daily Group note for Rec Tx Intervention  Date: 7.30.19 Time: 1430 [address]   AAA/T Program Assumption of Risk Form signed by Engineer, productionatient/ or Parent Legal Guardian YES   Patient is free of allergies or sever asthma YES  Patient reports no fear of animals  YES  Patient reports no history of cruelty to animals  YES  Patient understands his/her participation is voluntary  YES   Patient washes hands before animal contact  YES   Patient washes hands after animal contact  YES   Behavioral Response: Engaged  Education: Charity fundraiserHand Washing, Appropriate Animal Interaction   Education Outcome: Acknowledges understanding/In group clarification offered/Needs additional education.   Clinical Observations/Feedback: Pt attended and participated in group.    Caroll RancherMarjette Izzah Pasqua, LRT/CTRS    Caroll RancherLindsay, Loomis Anacker A 11/14/2017 3:38 PM

## 2017-11-14 NOTE — Progress Notes (Signed)
Adult Psychoeducational Group Note  Date:  11/14/2017 Time:  9:54 PM  Group Topic/Focus:  Wrap-Up Group:   The focus of this group is to help patients review their daily goal of treatment and discuss progress on daily workbooks.  Participation Level:  Active  Participation Quality:  Appropriate  Affect:  Appropriate  Cognitive:  Appropriate  Insight: Appropriate  Engagement in Group:  Engaged  Modes of Intervention:  Discussion  Additional Comments:  Patient attended wrap-up group and said that her day was a 5. Her coping skills for today were coloring, socializing and watching tv.   Layson Bertsch W Linkon Siverson 11/14/2017, 9:54 PM

## 2017-11-14 NOTE — Progress Notes (Signed)
Multiple attempts made to reach pt's employer ([employer] [phone]) to notify General manager of her hospitalization per pt request. No answer/ no voicemail. Pt to be provided with letter at discharge and contact information to her employer to follow-up.  Synda Bagent S. Alan RipperHolloway, MSW, LCSW Clinical Social Worker 11/14/2017 2:25 PM
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
## 2017-11-14 NOTE — Plan of Care (Signed)
Pt progressing in the following metrics  D: Pt stated she slept well last night. Pt found in the dayroom interacting with peers. Pt compliant with medication administration. Pt denies any physical pain. Pt rates her depression/hopelessness/anxiety all 0/10. Pt states her goal for today is to see the doctor and remain calm. Pt will achieve this by using breathing exercises, coloring, and meet the dogs that come. Pt denies any si/hi/ah/vh and verbally agrees to approach staff if these become apparent.  A: pt provided support and encouragement. Pt provided medications per protocol and standing orders. Q416m safety checks implemented and continued. R: pt safe on the unit. Will continue to monitor.   Problem: Education: Goal: Emotional status will improve Outcome: Progressing Goal: Mental status will improve Outcome: Progressing   Problem: Activity: Goal: Interest or engagement in activities will improve Outcome: Progressing   Problem: Coping: Goal: Ability to verbalize frustrations and anger appropriately will improve Outcome: Progressing   Problem: Health Behavior/Discharge Planning: Goal: Compliance with treatment plan for underlying cause of condition will improve Outcome: Progressing   Problem: Physical Regulation: Goal: Ability to maintain clinical measurements within normal limits will improve Outcome: Progressing   Problem: Activity: Goal: Interest or engagement in leisure activities will improve Outcome: Progressing Goal: Imbalance in normal sleep/wake cycle will improve Outcome: Progressing   Problem: Health Behavior/Discharge Planning: Goal: Compliance with therapeutic regimen will improve Outcome: Progressing   Problem: Self-Concept: Goal: Will verbalize positive feelings about self Outcome: Progressing   Problem: Health Behavior/Discharge Planning: Goal: Ability to remain free from injury will improve Outcome: Progressing   Problem: Coping: Goal: Coping ability  will improve Outcome: Progressing   Problem: Medication: Goal: Compliance with prescribed medication regimen will improve Outcome: Progressing   Problem: Self-Concept: Goal: Ability to disclose and discuss suicidal ideas will improve Outcome: Progressing

## 2017-11-15 ENCOUNTER — Encounter (HOSPITAL_COMMUNITY): Payer: Self-pay | Admitting: Behavioral Health

## 2017-11-15 DIAGNOSIS — F419 Anxiety disorder, unspecified: Secondary | ICD-10-CM

## 2017-11-15 DIAGNOSIS — G4700 Insomnia, unspecified: Secondary | ICD-10-CM

## 2017-11-15 DIAGNOSIS — F17210 Nicotine dependence, cigarettes, uncomplicated: Secondary | ICD-10-CM

## 2017-11-15 MED ORDER — ARIPIPRAZOLE ER 400 MG IM SRER
400.0000 mg | INTRAMUSCULAR | Status: DC
  Administered 2017-11-15: 400 mg via INTRAMUSCULAR

## 2017-11-15 NOTE — Progress Notes (Signed)
Pt attended NA group this evening.  

## 2017-11-15 NOTE — Tx Team (Signed)
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 acetaminophen (TYLENOL) tablet 650 mg  650 mg Oral Q6H PRN Charm RingsLord, Jamison Y, NP      . alum & mag hydroxide-simeth (MAALOX/MYLANTA) 200-200-20 MG/5ML suspension 30 mL  30 mL Oral Q4H PRN Charm RingsLord, Jamison Y, NP      . amLODipine (NORVASC) tablet 5 mg  5 mg Oral Daily Charm RingsLord, Jamison Y, NP   5 mg at 11/14/17 0753  . ARIPiprazole (ABILIFY) tablet 10 mg  10 mg Oral Theodora BlowBH-q7a Maurer, Sheila M, MD   10 mg at 11/15/17 [phone]  . ARIPiprazole ER (ABILIFY MAINTENA) injection 400 mg  400 mg Intramuscular Q28 days Cobos, Rockey SituFernando A, MD      . FLUoxetine (PROZAC) capsule 40 mg  40 mg Oral Daily Mariel CraftMaurer, Sheila M, MD      . hydrOXYzine (ATARAX/VISTARIL) tablet 25 mg  25 mg Oral TID PRN Jackelyn PolingBerry, Jason A, NP   25 mg at 11/14/17 2129  . magnesium hydroxide (MILK OF MAGNESIA) suspension 30 mL  30 mL Oral Daily PRN Charm RingsLord, Jamison Y, NP      . mometasone-formoterol (DULERA) 100-5 MCG/ACT inhaler 2 puff  2 puff Inhalation BID Charm RingsLord, Jamison Y, NP   2 puff at 11/14/17 0753  . nicotine (NICODERM CQ - dosed in mg/24 hours) patch 21 mg  21 mg Transdermal Daily Mariel CraftMaurer, Sheila M, MD   Stopped at 11/14/17 1641  . traZODone (DESYREL) tablet 100 mg  100 mg Oral QHS Nira ConnBerry, Jason A, NP   100 mg at 11/14/17 2129   PTA Medications: Medications Prior to Admission  Medication Sig Dispense Refill Last Dose  . amLODipine (NORVASC) 5  MG tablet Take 5 mg by mouth daily.  3 11/11/2017 at Unknown time  . amphetamine-dextroamphetamine (ADDERALL) 20 MG tablet Take 20 mg by mouth daily.  0 11/11/2017 at Unknown time  . DULERA 100-5 MCG/ACT AERO Inhale 2 puffs into the lungs 2 (two) times daily.  3 Past Week at Unknown time  . FLUoxetine (PROZAC) 20 MG capsule Take 20 mg by mouth daily.  3 11/11/2017 at Unknown time  . PROAIR HFA 108 (90 Base) MCG/ACT inhaler INHALE 2 PUFFS BY MOUTH 4 (FOUR) TIMES A DAY  3 Past Week at Unknown time  . QUEtiapine (SEROQUEL) 50 MG tablet Take 50 mg by mouth at bedtime.  3 11/12/2017 at Unknown time    Patient Stressors: Financial difficulties Loss of aunt Marital or family conflict Occupational concerns  Patient Strengths: Ability for insight Average or above average Radio producerintelligence Communication skills Motivation for treatment/growth Physical Health Supportive family/friends  Treatment Modalities: Medication Management, Group therapy, Case management,  1 to 1 session with clinician, Psychoeducation, Recreational therapy.   Physician Treatment Plan for Primary Diagnosis: Bipolar 2 disorder, major depressive episode (HCC) Long Term Goal(s): Improvement in symptoms so as ready for discharge Improvement in symptoms so as ready for discharge   Short Term Goals: Ability to identify changes in lifestyle to reduce recurrence of condition will improve Ability to verbalize  feelings will improve Ability to disclose and discuss suicidal ideas Ability to demonstrate self-control will improve Ability to identify and develop effective coping behaviors will improve Compliance with prescribed medications will improve Ability to identify triggers associated with substance abuse/mental health issues will improve Ability to identify changes in lifestyle to reduce recurrence of condition will improve Ability to verbalize feelings will improve Ability to disclose and discuss suicidal ideas Ability to  demonstrate self-control will improve Ability to identify and develop effective coping behaviors will improve Compliance with prescribed medications will improve Ability to identify triggers associated with substance abuse/mental health issues will improve  Medication Management: Evaluate patient's response, side effects, and tolerance of medication regimen.  Therapeutic Interventions: 1 to 1 sessions, Unit Group sessions and Medication administration.  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  Long Term Goal(s): Improvement in symptoms so as ready for discharge Improvement in symptoms so as ready for discharge   Short Term Goals: Ability to identify changes in lifestyle to reduce recurrence of condition will improve Ability to verbalize feelings will improve Ability to disclose and discuss suicidal ideas Ability to demonstrate self-control will improve Ability to identify and develop effective coping behaviors will improve Compliance with prescribed medications will improve Ability to identify triggers associated with substance abuse/mental health issues will improve Ability to identify changes in lifestyle to reduce recurrence of condition will improve Ability to verbalize feelings will improve Ability to disclose and discuss suicidal ideas Ability to demonstrate self-control will improve Ability to identify and develop effective coping behaviors will improve Compliance with prescribed medications will improve Ability to identify triggers associated with substance abuse/mental health issues will improve     Medication Management: Evaluate patient's response, side effects,  and tolerance of medication regimen.  Therapeutic Interventions: 1 to 1 sessions, Unit Group sessions and Medication administration.  Evaluation of Outcomes: Progressing   RN Treatment Plan for Primary Diagnosis: Bipolar 2 disorder, major depressive episode (HCC) Long Term Goal(s): Knowledge of disease and therapeutic regimen to maintain health will improve  Short Term Goals: Ability to remain free from injury will improve, Ability to demonstrate self-control, Ability to disclose and discuss suicidal ideas and Ability to identify and develop effective coping behaviors will improve  Medication Management: RN will administer medications as ordered by provider, will assess and evaluate patient's response and provide education to patient for prescribed medication. RN will report any adverse and/or side effects to prescribing provider.  Therapeutic Interventions: 1 on 1 counseling sessions, Psychoeducation, Medication administration, Evaluate responses to treatment, Monitor vital signs and CBGs as ordered, Perform/monitor CIWA, COWS, AIMS and Fall Risk screenings as ordered, Perform wound care treatments as ordered.  Evaluation of Outcomes: Progressing   LCSW Treatment Plan for Primary Diagnosis: Bipolar 2 disorder, major depressive episode (HCC) Long Term Goal(s): Safe transition to appropriate next level of care at discharge, Engage patient in therapeutic group addressing interpersonal concerns.  Short Term Goals: Engage patient in aftercare planning with referrals and resources, Facilitate patient progression through stages of change regarding substance use diagnoses and concerns and Identify triggers associated with mental health/substance abuse issues  Therapeutic Interventions: Assess for all discharge needs, 1 to 1 time with Social worker, Explore available resources and support systems, Assess for adequacy  in community support network, Educate family and significant other(s) on suicide  prevention, Complete Psychosocial Assessment, Interpersonal group therapy.  Evaluation of Outcomes: Progressing   Progress in Treatment: Attending groups: Yes. Participating in groups: Yes. Taking medication as prescribed: Yes. Toleration medication: Yes. Family/Significant other contact made: SPE completed with pt; pt declined to consent to collateral contact.  Patient understands diagnosis: Yes. Discussing patient identified problems/goals with staff: Yes. Medical problems stabilized or resolved: Yes. Denies suicidal/homicidal ideation: Yes. Issues/concerns per patient self-inventory: No. Other: n/a   New problem(s) identified: No, Describe:  n/a  New Short Term/Long Term Goal(s): detox, medication management for mood stabilization; elimination of SI thoughts; development of comprehensive mental wellness/sobriety plan.   Patient Goals:  "I want to work on coping skills for my anger."   Discharge Plan or Barriers: Pt plans to [facility] at [facility] and [facility] for group therapy/SAIOP. MHAG pamphlet, Mobile Crisis information, and AA/NA information provided to patient for additional community support and resources.   Reason for Continuation of Hospitalization: Anxiety Depression Medication stabilization Withdrawal symptoms  Estimated Length of Stay: Friday, 11/17/17  Attendees: Patient: 11/15/2017 8:31 AM  Physician: Dr. Viviano Simas MD; Dr. Jama Flavors MD 11/15/2017 8:31 AM  Nursing: Casimiro Needle RN; Roni RN 11/15/2017 8:31 AM  RN Care Manager:x 11/15/2017 8:31 AM  Social Worker: Corrie Mckusick LCSW 11/15/2017 8:31 AM  Recreational Therapist: x 11/15/2017 8:31 AM  Other: Armandina Stammer NP; Renold Don NP 11/15/2017 8:31 AM  Other:  11/15/2017 8:31 AM  Other: 11/15/2017 8:31 AM    Scribe for Treatment Team: Rona Ravens, LCSW 11/15/2017 8:31 AM

## 2017-11-15 NOTE — Therapy (Signed)
Occupational Therapy Group Note  Date:  11/15/2017 Time:  11:14 AM  Group Topic/Focus:  Stress Management  Participation Level:  Active  Participation Quality:  Appropriate  Affect:  Flat  Cognitive:  Appropriate  Insight: Improving  Engagement in Group:  Engaged  Modes of Intervention:  Activity, Discussion, Education and Socialization  Additional Comments:    S: "My family won't talk to me because I won't leave my fiance"  O: Stress management group completed to use as productive coping strategy, to help mitigate maladaptive coping to integrate in functional BADL/IADL when reintegrating into community. Education given on the definition of stress and its cognitive, behavioral, emotional, and physical effects on the body. Stress management tools worksheet completed to identify negative coping mechanisms and their short and long term effects vs positive coping mechanisms with demonstration. Coping strategies taught include: relaxation based- deep breathing, counting to 10, taking a 1 minute vacation, acceptance, stress balls, relaxation audio/video, visual/mental imagery. Positive mental attitude- gratitude, acceptance, cognitive reframing, positive self talk, anger management. Gratitude journaling handout and instruction also given. Adult coloring and relaxation tips worksheet given at end of session.   A: Pt presents to group with flat affect, mentions she is drowsy, but engaged and participatory throughout group. Stress management tools completed, stating she is currently stressed by a family situation with her family and fiance. Pt states most of her coping skills are substance and suppression related. She is interested in trying relaxation techniques this date. Pt acquired gratitude journal and coloring sheet at end of session.  P: Pt provided with education on stress management activities to implement into daily routine. Handouts given to facilitate carryover when reintegrating into  communit    [facility] Surgical Facility LtdKaylee Zakye Baby, MSOT, OTR/L  AvnetKaylee Jayda White 11/15/2017, 11:14 AM

## 2017-11-15 NOTE — Progress Notes (Signed)
[facility]  11/15/2017 10:52 AM Caroline Janeece AgeeM Fuhrer  MRN:  [id number]   Subjective:  " I am here because I wanted to sleep so I took more of my Seroquel than I should have."  Objective: Face to face evaluation completed, case discussed with treatment team and chart reviewed. Caroline Ingram is a 32 y.o. Female who [facility] after ingesting 10-15 Seroquel on Sunday night.  On assessment, she is very pleasant, alert and oriented x4, calm and cooperative. She intitially reports she was admitted after she overdose don Seroquel following a fall out with her boyfriend. She follows by saying it was not a suicide attempt and that she just wanted to sleep. She seems to be minimizing her reason for admission. She denies that she is depressed today yet endorses some anxiety/worry. She states she is worried that her friends will not understand why she is willingly to remain her boyfriend. She encouraged to focus on her mental health state doing this hospital course. She denies active or passive SI, AVH, self harming urges. She does not appear internally preoccupied. She has a history of PTSD although denies PTSD related symptoms at this time. Denies concern with appetite or resting pattern describing both as fair. Reports a history of cocaine and THC use. She is negative for withdrawal symptoms. Her Prozac was increased to 40 mg QD for depression and started Abilify 10 mg QD for depression augmentation and mood stabilization with transition to LAI Abilify Maintenna 400 mg and thus far, she reports the medications are well tolerated without any side effects. No behavioral concerns are noted. She is adjusting to the unit well and participating in therapeutic group sessions. At this time, she is contracting for safety on the unit.      Principal Problem: Bipolar 2 disorder, major depressive episode (HCC) Diagnosis:   Patient Active Problem List   Diagnosis Date Noted  . Bipolar 2 disorder, major  depressive episode (HCC) [F31.81] 11/14/2017  . 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 Major depressive disorder, recurrent severe without psychotic features (HCC) [F33.2] 11/13/2017  . ANEMIA [D64.9] 01/13/2009  . Substance induced mood disorder (HCC) [F19.94] 01/13/2009  . DEPRESSION [F32.9] 01/13/2009  . ASTHMA [J45.909] 01/13/2009  . SYNCOPE [R55] 01/13/2009   Total Time spent with patient: 20 minutes  Past Psychiatric History: Bipolar disorder , PTSD, depression, ADHD.   Past Medical History:  Past Medical History:  Diagnosis Date  . ADHD   . Asthma   . Depression    History reviewed. No pertinent surgical history. Family History: History reviewed. No pertinent family history. Family Psychiatric  History: Both sides of the family: Schizophrenia, bipolar disorder, PTSD, psychosocial disorder, manic depression   Social History:  Social History   Substance and Sexual Activity  Alcohol Use Not Currently  . Frequency: Never     Social History   Substance and Sexual Activity  Drug Use Never    Social History   Socioeconomic History  . Marital status: Single    Spouse name: Not on file  . Number of children: Not on file  . Years of education: Not on file  . Highest education level: Not on file  Occupational History  . Not on file  Social Needs  . Financial resource strain: Not on file  . Food insecurity:    Worry: Not on file    Inability: Not on file  . Transportation needs:    Medical: Not on file  Non-medical: Not on file  Tobacco Use  . Smoking status: Current Every Day Smoker    Packs/day: 1.00    Types: Cigarettes  . Smokeless tobacco: Never Used  Substance and Sexual Activity  . Alcohol use: Not Currently    Frequency: Never  . Drug use: Never  . Sexual activity: Yes    Birth control/protection: None  Lifestyle  . Physical activity:    Days per week: Not on file    Minutes per session: Not on file  .  Stress: Not on file  Relationships  . Social connections:    Talks on phone: Not on file    Gets together: Not on file    Attends religious service: Not on file    Active member of club or organization: Not on file    Attends meetings of clubs or organizations: Not on file    Relationship status: Not on file  Other Topics Concern  . Not on file  Social History Narrative  . Not on file   Additional Social History:       Sleep: Fair  Appetite:  Fair  Current Medications: Current Facility-Administered Medications  Medication Dose Route Frequency Provider Last Rate Last Dose  . acetaminophen (TYLENOL) tablet 650 mg  650 mg Oral Q6H PRN Charm Rings, NP      . alum & mag hydroxide-simeth (MAALOX/MYLANTA) 200-200-20 MG/5ML suspension 30 mL  30 mL Oral Q4H PRN Charm Rings, NP      . amLODipine (NORVASC) tablet 5 mg  5 mg Oral Daily Charm Rings, NP   5 mg at 11/15/17 0981  . ARIPiprazole (ABILIFY) tablet 10 mg  10 mg Oral Theodora Blow, MD   10 mg at 11/15/17 [id number]  . ARIPiprazole ER (ABILIFY MAINTENA) injection 400 mg  400 mg Intramuscular Q28 days Cobos, Rockey Situ, MD   400 mg at 11/15/17 0827  . FLUoxetine (PROZAC) capsule 40 mg  40 mg Oral Daily Mariel Craft, MD   40 mg at 11/15/17 7829  . hydrOXYzine (ATARAX/VISTARIL) tablet 25 mg  25 mg Oral TID PRN Jackelyn Poling, NP   25 mg at 11/14/17 2129  . magnesium hydroxide (MILK OF MAGNESIA) suspension 30 mL  30 mL Oral Daily PRN Charm Rings, NP      . mometasone-formoterol (DULERA) 100-5 MCG/ACT inhaler 2 puff  2 puff Inhalation BID Charm Rings, NP   2 puff at 11/15/17 0826  . nicotine (NICODERM CQ - dosed in mg/24 hours) patch 21 mg  21 mg Transdermal Daily Mariel Craft, MD   21 mg at 11/15/17 1011  . traZODone (DESYREL) tablet 100 mg  100 mg Oral QHS Nira Conn A, NP   100 mg at 11/14/17 2129    Lab Results: No results found for this or any previous visit (from the past 48 hour(s)).  Blood  Alcohol level:  Lab Results  Component Value Date   ETH <10 11/12/2017    Metabolic Disorder Labs: No results found for: HGBA1C, MPG No results found for: PROLACTIN No results found for: CHOL, TRIG, HDL, CHOLHDL, VLDL, LDLCALC  Physical Findings: AIMS: Facial and Oral Movements Muscles of Facial Expression: None, normal Lips and Perioral Area: None, normal Jaw: None, normal Tongue: None, normal,Extremity Movements Upper (arms, wrists, hands, fingers): None, normal Lower (legs, knees, ankles, toes): None, normal, Trunk Movements Neck, shoulders, hips: None, normal, Overall Severity Severity of abnormal movements (highest score from questions above): None, normal  Incapacitation due to abnormal movements: None, normal Patient's awareness of abnormal movements (rate only patient's report): No Awareness, Dental Status Current problems with teeth and/or dentures?: No Does patient usually wear dentures?: No  CIWA:    COWS:     Musculoskeletal: Strength & Muscle Tone: within normal limits Gait & Station: normal Patient leans: N/A  Psychiatric Specialty Exam: Physical Exam  Nursing note and vitals reviewed. Constitutional: She is oriented to person, place, and time.  Neurological: She is alert and oriented to person, place, and time.    Review of Systems  Psychiatric/Behavioral: Positive for depression and substance abuse. Negative for hallucinations, memory loss and suicidal ideas. The patient is not nervous/anxious and does not have insomnia.   All other systems reviewed and are negative.   Blood pressure (!) 124/94, pulse 66, temperature 98.2 F (36.8 C), temperature source Oral, resp. rate 18, height 5\' 4"  (1.626 m), weight 83 kg (183 lb), SpO2 100 %.Body mass index is 31.41 kg/m.  General Appearance: Casual  Eye Contact:  Good  Speech:  Clear and Coherent and Normal Rate  Volume:  Normal  Mood:  Anxious and Dysphoric  Affect:  Congruent  Thought Process:  Coherent,  Goal Directed, Linear and Descriptions of Associations: Intact  Orientation:  Full (Time, Place, and Person)  Thought Content:  Logical No AVH, hallucinations or ruminations   Suicidal Thoughts:  No  Homicidal Thoughts:  No  Memory:  Immediate;   Fair Recent;   Fair  Judgement:  Impaired  Insight:  Shallow  Psychomotor Activity:  Normal  Concentration:  Concentration: Fair and Attention Span: Fair  Recall:  [employer] of Knowledge:  Fair  Language:  Good  Akathisia:  Negative  Handed:  Right  AIMS (if indicated):     Assets:  Communication Skills Desire for Improvement Resilience Social Support  ADL's:  Intact  Cognition:  WNL  Sleep:  Number of Hours: 6.75    Treatment Plan Summary: Reviewed current treatment plan. Will continue the following plan without adjustments at this time;   Daily contact with patient to assess and evaluate symptoms and progress in treatment   Bipolar 2 disorder, MDD- Patient minimize her depression. Her insight is shallow. She denies any mania or other symptoms rel;ated to Bipolar. Will continue  Prozac to 40 mg QD for depression; Abilify 10 mg QD for depression augmentation and mood stabilization as well as  LAI Abilify Maintenna 400 mg (Next dose to be administered in 28 days which will be by outpatient provider).    Smoking Cessation- Continues to endorse some urges. continue Nicoderm patch if patient desires  Anxiety-No significant anxiety reported. Continue Vistaril 25 mg po TID as needed.  Insomnia- Stable. Continue Trazodone 50 mg po qhs as needed.   Other:   Safety: Continue 15 minute observation for safety checks. Patient is able to contract for safety on the unit at this time  Labs: Pregnancy negative, UDS positive for amphetamines (patient on Adderall prior to admission) and THC. Ordered TSH, HgbA1c, lipid panel and Prolactin.   Continue to develop treatment plan to decrease risk of relapse upon discharge and to reduce the need for  readmission.  Psycho-social education regarding relapse prevention and self care.  Health care follow up as needed for medical problems.  Continue to attend and participate in therapy.     Denzil Magnuson, NP 11/15/2017, 10:52 AM

## 2017-11-15 NOTE — Progress Notes (Signed)
D: Pt was in the dayroom upon initial approach.  She presents with anxious affect and mood.  Pt is seen smiling and laughing on occasion.  She describes her day as "good" and reports goal was to "see my fiance today and he came."  Pt reports she had a good visit with her fiance tonight.  She denies SI/HI, hallucinations, and pain.  She has been visible in milieu interacting appropriately with peers and staff.  A: Introduced self to pt.  Actively listened to pt and offered support and encouragement.  Medication administered per order.  PRN medication administered for anxiety.  Q15 minute safety checks maintained.  R: Pt is compliant with medications.  She verbally contracts for safety and reports she will inform staff of needs and concerns.  Will continue to monitor and assess.

## 2017-11-15 NOTE — Progress Notes (Signed)
Recreation Therapy Notes  Date: 7.31.19 Time: 0930 Location: 300 [address] Dayroom  Group Topic: Stress Management  Goal Area(s) Addresses:  Patient will verbalize importance of using healthy stress management.  Patient will identify positive emotions associated with healthy stress management.   Behavioral Response: Engaged  Intervention: Stress Management  Activity :  Guided Imagery.  LRT introduced the stress management technique of guided imagery.  LRT read Ingram script about peaceful waves.  Patients were to follow along as LRT read script to relax and envision being on the beach.  Education:  Stress Management, Discharge Planning.   Education Outcome: Acknowledges edcuation/In group clarification offered/Needs additional education  Clinical Observations/Feedback:  Pt attended and participated in group.      Caroline Ingram, LRT/CTRS    Caroline Ingram, Caroline Ingram 11/15/2017 10:29 AM

## 2017-11-15 NOTE — Plan of Care (Signed)
  Problem: Health Behavior/Discharge Planning: Goal: Ability to remain free from injury will improve Outcome: Progressing Note:  Pt has not harmed self or others tonight.  She denies SI/HI and verbally contracts for safety.

## 2017-11-15 NOTE — Plan of Care (Signed)
Pt progressing in the following metrics  D: pt found in bed resting: pt compliant with medication administration. Pt states she slept well but is still tired. Pt denies any si/hi/ah/vh and verbally agrees to approach staff if these become apparent. Pt states that her biggest stressor at the moment is that her best friends don't want to be here friends anymore if she stays with her boyfriend. Pt states she's been dating him for 7 months. Pt states that he has agreed to counseling and seeking help as well once she gets out which makes her have hope for them. A: pt provided support and encouragement. Pt provided medications per protocol and standing orders. Q5771m safety checks implemented and continued.  R: pt safe on the unit. Will continue to monitor.   Problem: Education: Goal: Verbalization of understanding the information provided will improve Outcome: Progressing   Problem: Coping: Goal: Ability to demonstrate self-control will improve Outcome: Progressing   Problem: Health Behavior/Discharge Planning: Goal: Identification of resources available to assist in meeting health care needs will improve Outcome: Progressing   Problem: Education: Goal: Utilization of techniques to improve thought processes will improve Outcome: Progressing   Problem: Coping: Goal: Coping ability will improve Outcome: Progressing   Problem: Health Behavior/Discharge Planning: Goal: Ability to make decisions will improve Outcome: Progressing   Problem: Safety: Goal: Ability to identify and utilize support systems that promote safety will improve Outcome: Progressing

## 2017-11-16 ENCOUNTER — Encounter (HOSPITAL_COMMUNITY): Payer: Self-pay | Admitting: Behavioral Health

## 2017-11-16 LAB — LIPID PANEL
Cholesterol: 191 mg/dL (ref 0–200)
HDL: 45 mg/dL (ref >40)
LDL Cholesterol: 125 mg/dL — ABNORMAL HIGH (ref 0–99)
Total CHOL/HDL Ratio: 4.2 RATIO
Triglycerides: 103 mg/dL (ref <150)
VLDL: 21 mg/dL (ref 0–40)

## 2017-11-16 LAB — HEMOGLOBIN A1C
Hgb A1c MFr Bld: 5.3 % (ref 4.8–5.6)
Mean Plasma Glucose: 105.41 mg/dL

## 2017-11-16 LAB — TSH: TSH: 2.514 u[IU]/mL (ref 0.350–4.500)

## 2017-11-16 MED ORDER — ARIPIPRAZOLE 10 MG PO TABS: 10.0000 mg | ORAL_TABLET | ORAL | 0 refills | Status: DC

## 2017-11-16 MED ORDER — FLUOXETINE HCL 40 MG PO CAPS: 40.0000 mg | ORAL_CAPSULE | Freq: Every day | ORAL | 0 refills | Status: DC

## 2017-11-16 MED ORDER — TRAZODONE HCL 100 MG PO TABS: 100.0000 mg | ORAL_TABLET | Freq: Every day | ORAL | 0 refills | Status: DC

## 2017-11-16 MED ORDER — HYDROXYZINE HCL 25 MG PO TABS: 25.0000 mg | ORAL_TABLET | Freq: Three times a day (TID) | ORAL | 0 refills | Status: DC | PRN

## 2017-11-16 MED ORDER — AMLODIPINE BESYLATE 5 MG PO TABS: 5.0000 mg | ORAL_TABLET | Freq: Every day | ORAL | 0 refills | Status: DC

## 2017-11-16 MED ORDER — ARIPIPRAZOLE ER 400 MG IM SRER: 400.0000 mg | INTRAMUSCULAR | 0 refills | Status: DC
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
## 2017-11-16 NOTE — Progress Notes (Signed)
Patient ID: Caroline Ingram, female   DOB: Dec 15, 1985, 32 y.o.   MRN: [id number] Patient denies SI, HI and AVH upon discharge.  Patient acknowledged understanding of all discharge information and receipt of all personal belongings.  Patient in bright mood upon discharge.

## 2017-11-16 NOTE — Progress Notes (Signed)
  [facility] :  Will you be returning to the same living situation after discharge:  Yes,  home At discharge, do you have transportation home?: Yes,  bus Do you have the ability to pay for your medications: Yes,  [facility]  Release of information consent forms completed and submitted to medical records by CSW.   Patient to Follow up at: Follow-up Information    [facility] Follow up on 11/20/2017.   Specialty:  Behavioral Health Why:  Hospital follow-up on Monday 8/5 at 8;15AM. Please bring: photo ID, social security card, medicaid card, and hospital dischargep paperwork to this appt if you have it. Thank you. Contact information: [address] [place] [address] [phone]        [facility].   Why:  Please resume daily group therapy at discharge from 9am-1pm Monday-Friday. Thank you.  Contact information: [address] [place], [place] [address] Phone: [phone] Fax: [phone]/none          Next level of care provider [facility]:no  Safety Planning and Suicide Prevention discussed: Yes,  SPE completed with pt; pt declined to consent to collateral contact.    Has patient been referred to the Quitline?: Patient refused referral  Patient has been referred for addiction treatment: Yes  Rona RavensHeather S Jalesa Thien, LCSW 11/16/2017, 8:49 AM
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
## 2017-11-17 LAB — PROLACTIN: Prolactin: 14 ng/mL (ref 4.8–23.3)

## 2017-11-21 ENCOUNTER — Other Ambulatory Visit: Payer: Self-pay

## 2017-11-21 ENCOUNTER — Emergency Department (HOSPITAL_COMMUNITY): Payer: Medicaid Other

## 2017-11-21 ENCOUNTER — Encounter (HOSPITAL_COMMUNITY): Payer: Self-pay

## 2017-11-21 ENCOUNTER — Emergency Department (HOSPITAL_COMMUNITY)
Admission: EM | Admit: 2017-11-21 | Discharge: 2017-11-21 | Disposition: A | Discharge status: Home / Self Care | Payer: Medicaid Other | Attending: Emergency Medicine | Admitting: Emergency Medicine

## 2017-11-21 DIAGNOSIS — R0602 Shortness of breath: Secondary | ICD-10-CM | POA: Diagnosis not present

## 2017-11-21 DIAGNOSIS — R42 Dizziness and giddiness: Secondary | ICD-10-CM | POA: Diagnosis not present

## 2017-11-21 DIAGNOSIS — R531 Weakness: Secondary | ICD-10-CM | POA: Diagnosis not present

## 2017-11-21 DIAGNOSIS — R112 Nausea with vomiting, unspecified: Secondary | ICD-10-CM | POA: Insufficient documentation

## 2017-11-21 DIAGNOSIS — H538 Other visual disturbances: Secondary | ICD-10-CM | POA: Diagnosis not present

## 2017-11-21 DIAGNOSIS — J45909 Unspecified asthma, uncomplicated: Secondary | ICD-10-CM | POA: Insufficient documentation

## 2017-11-21 DIAGNOSIS — R5383 Other fatigue: Secondary | ICD-10-CM | POA: Diagnosis not present

## 2017-11-21 DIAGNOSIS — Z79899 Other long term (current) drug therapy: Secondary | ICD-10-CM | POA: Insufficient documentation

## 2017-11-21 DIAGNOSIS — F17210 Nicotine dependence, cigarettes, uncomplicated: Secondary | ICD-10-CM | POA: Insufficient documentation

## 2017-11-21 DIAGNOSIS — R0789 Other chest pain: Secondary | ICD-10-CM | POA: Diagnosis not present

## 2017-11-21 LAB — CBC
HCT: 39.9 % (ref 36.0–46.0)
Hemoglobin: 12.6 g/dL (ref 12.0–15.0)
MCH: 27.3 pg (ref 26.0–34.0)
MCHC: 31.6 g/dL (ref 30.0–36.0)
MCV: 86.6 fL (ref 78.0–100.0)
Platelets: 196 10*3/uL (ref 150–400)
RBC: 4.61 MIL/uL (ref 3.87–5.11)
RDW: 13.3 % (ref 11.5–15.5)
WBC: 9.4 10*3/uL (ref 4.0–10.5)

## 2017-11-21 LAB — BASIC METABOLIC PANEL
Anion gap: 7 (ref 5–15)
BUN: 10 mg/dL (ref 6–20)
CO2: 22 mmol/L (ref 22–32)
Calcium: 8.3 mg/dL — ABNORMAL LOW (ref 8.9–10.3)
Chloride: 109 mmol/L (ref 98–111)
Creatinine, Ser: 0.86 mg/dL (ref 0.44–1.00)
GFR calc Af Amer: >60 mL/min (ref >60)
GFR calc non Af Amer: >60 mL/min (ref >60)
Glucose, Bld: 99 mg/dL (ref 70–99)
Potassium: 4.6 mmol/L (ref 3.5–5.1)
Sodium: 138 mmol/L (ref 135–145)

## 2017-11-21 LAB — I-STAT BETA HCG BLOOD, ED (MC, WL, AP ONLY): I-stat hCG, quantitative: <5 m[IU]/mL (ref <5)

## 2017-11-21 LAB — I-STAT TROPONIN, ED: Troponin i, poc: 0 ng/mL (ref 0.00–0.08)

## 2017-11-21 NOTE — ED Provider Notes (Signed)
[facility] Provider Note   CSN: [id number] Arrival date & time: 11/21/17  1352     History   Chief Complaint Chief Complaint  Patient presents with  . Chest Pain    HPI Caroline Ingram is a 32 y.o. female.  Caroline Ingram is a 32 y.o. Female history of asthma, ADHD and depression, who presents to the emergency department for evaluation of intermittent left upper chest pains the past week.  Pain is described as a pinching that radiates up into the left shoulder and is worse with movement.  It improves with laying flat or resting.  She denies any associated diaphoresis, had one episode of shortness of breath when walking on hill, but has not had any since then.  Pain does not radiate to the arm neck or jaw, is not pleuritic or exertional in nature.  She reports she recently had her psych medications changed to Abilify, trazodone and increase in her Prozac dose after being discharged from behavioral health, and wonders if this could be causing the symptoms.  She also reports some intermittent blurry vision and just feeling generally weak and fatigued, she reports I think I went back to work too quickly after being let out of the behavioral health hospital..  She reports feeling a little bit nauseated and had one episode of nonbloody nonbilious emesis about 4 days ago, denies any abdominal pain.  She [facility], is unsure what day her follow-up appointment is on, but has not contacted anybody regarding these potential side effects.  No swelling or pain in her lower extremities.  No prior history of DVT or PE, not currently on any hormone therapy, no recent long distance travel or surgery.     Past Medical History:  Diagnosis Date  . ADHD   . Asthma   . Depression     Patient Active Problem List   Diagnosis Date Noted  . Bipolar 2 disorder, major depressive episode (HCC) 11/14/2017  . 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 11/14/2017  . ANEMIA 01/13/2009  . Substance induced mood disorder (HCC) 01/13/2009  . DEPRESSION 01/13/2009  . ASTHMA 01/13/2009  . SYNCOPE 01/13/2009    History reviewed. No pertinent surgical history.   OB History   None      Home Medications    Prior to Admission medications   Medication Sig Start Date End Date Taking? Authorizing Provider  amLODipine (NORVASC) 5 MG tablet Take 1 tablet (5 mg total) by mouth daily. 11/16/17   Denzil Magnusonhomas, Lashunda, NP  amphetamine-dextroamphetamine (ADDERALL) 20 MG tablet Take 20 mg by mouth daily. 11/09/17   [provider]  ARIPiprazole (ABILIFY) 10 MG tablet Take 1 tablet (10 mg total) by mouth every morning. 11/17/17   Denzil Magnusonhomas, Lashunda, NP  ARIPiprazole ER (ABILIFY MAINTENA) 400 MG SRER injection Inject 2 mLs (400 mg total) into the muscle every 28 (twenty-eight) days. 01/12/18   Denzil Magnusonhomas, Lashunda, NP  DULERA 100-5 MCG/ACT AERO Inhale 2 puffs into the lungs 2 (two) times daily. 08/21/17   [provider]  FLUoxetine (PROZAC) 40 MG capsule Take 1 capsule (40 mg total) by mouth daily. 11/17/17   Denzil Magnusonhomas, Lashunda, NP  hydrOXYzine (ATARAX/VISTARIL) 25 MG tablet Take 1 tablet (25 mg total) by mouth 3 (three) times daily as needed for anxiety. 11/16/17   Denzil Magnusonhomas, Lashunda, NP  PROAIR HFA 108 (90 Base) MCG/ACT inhaler INHALE 2 PUFFS BY MOUTH 4 (FOUR) TIMES A DAY 08/21/17  [provider]  traZODone (DESYREL) 100 MG tablet Take 1 tablet (100 mg total) by mouth at bedtime. 11/16/17   Denzil Magnuson, NP    Family History History reviewed. No pertinent family history.  Social History Social History   Tobacco Use  . Smoking status: Current Every Day Smoker    Packs/day: 1.00    Types: Cigarettes  . Smokeless tobacco: Never Used  Substance Use Topics  . Alcohol use: Not Currently    Frequency: Never  . Drug use: Never     Allergies   Chocolate and Shrimp [shellfish allergy]   Review of  Systems Review of Systems  Constitutional: Negative for chills and fever.  HENT: Negative for congestion, rhinorrhea and sore throat.   Eyes: Negative for visual disturbance.  Respiratory: Negative for cough, chest tightness and wheezing.   Cardiovascular: Positive for chest pain. Negative for palpitations and leg swelling.  Gastrointestinal: Positive for vomiting. Negative for abdominal pain, blood in stool, constipation, diarrhea and nausea.  Genitourinary: Negative for dysuria and frequency.  Musculoskeletal: Negative for arthralgias and myalgias.  Skin: Negative for color change and rash.  Neurological: Positive for dizziness and light-headedness. Negative for syncope, facial asymmetry, speech difficulty, weakness, numbness and headaches.     Physical Exam Updated Vital Signs BP 125/81 (BP Location: Right Arm)   Pulse 70   Temp 98.7 F (37.1 C) (Oral)   Resp 16   SpO2 100%   Physical Exam  Constitutional: She appears well-developed and well-nourished. No distress.  HENT:  Head: Normocephalic and atraumatic.  Mouth/Throat: Oropharynx is clear and moist.  Eyes: Pupils are equal, round, and reactive to light. EOM are normal. Right eye exhibits no discharge. Left eye exhibits no discharge.  Neck: Neck supple.  Cardiovascular: Normal rate, regular rhythm, normal heart sounds and intact distal pulses. Exam reveals no gallop and no friction rub.  No murmur heard. Pulses:      Radial pulses are 2+ on the right side, and 2+ on the left side.       Dorsalis pedis pulses are 2+ on the right side, and 2+ on the left side.       Posterior tibial pulses are 2+ on the right side, and 2+ on the left side.  Pulmonary/Chest: Effort normal and breath sounds normal. No respiratory distress. She has no wheezes. She has no rales.  Abdominal: Soft. Bowel sounds are normal. She exhibits no distension and no mass. There is no tenderness. There is no guarding.  Musculoskeletal: She exhibits no edema  or deformity.       Right lower leg: She exhibits no tenderness and no edema.       Left lower leg: She exhibits no tenderness and no edema.  Neurological: She is alert. Coordination normal.  Speech is clear, able to follow commands CN III-XII intact Normal strength in upper and lower extremities bilaterally including dorsiflexion and plantar flexion, strong and equal grip strength Sensation normal to light and sharp touch Moves extremities without ataxia, coordination intact Steady gait  Skin: Skin is warm and dry. Capillary refill takes less than 2 seconds. She is not diaphoretic.  Nursing note and vitals reviewed.    ED Treatments / Results  Labs (all labs ordered are listed, but only abnormal results are displayed) Labs Reviewed  BASIC METABOLIC PANEL - Abnormal; Notable for the following components:      Result Value   Calcium 8.3 (*)    All other components within normal limits  CBC  I-STAT TROPONIN, ED  I-STAT BETA HCG BLOOD, ED ([facility], [facility], [facility] ONLY)    EKG EKG Interpretation  Date/Time:  Tuesday November 21 2017 13:57:43 EDT Ventricular Rate:  86 PR Interval:  140 QRS Duration: 76 QT Interval:  382 QTC Calculation: 457 R Axis:   -4 Text Interpretation:  Normal sinus rhythm no acute ST/T changes bradycardia no longer present, otherwise no significant change since November 15 2017 Confirmed by Pricilla Loveless [phone]) on 11/21/2017 4:11:23 PM   Radiology Dg Chest 2 View  Result Date: 11/21/2017 CLINICAL DATA:  Left-sided chest pain, shortness of breath, and nausea and vomiting for the past 10 days. Current smoker. History of asthma. EXAM: CHEST - 2 VIEW COMPARISON:  Portable chest x-ray of July 23, 2009 FINDINGS: The lungs are mildly hyperinflated and clear. The heart and pulmonary vascularity are normal. The mediastinum is normal in width. There is no pleural effusion. The bony thorax exhibits no acute abnormality. IMPRESSION: There is no active cardiopulmonary disease. Mild  hyperinflation may be voluntary or may reflect the patient's history of reactive airway disease and smoking. Electronically Signed   By: David  [place] M.D.   On: 11/21/2017 14:30    Procedures Procedures (including critical care time)  Medications Ordered in ED Medications - No data to display   Initial Impression / Assessment and Plan / ED Course  I have reviewed the triage vital signs and the nursing notes.  Pertinent labs & imaging results that were available during my care of the patient were reviewed by me and considered in my medical decision making (see chart for details).  Chest pain is not likely of cardiac or pulmonary etiology d/t presentation, PERC negative, VSS, no tracheal deviation, no JVD or new murmur, RRR, breath sounds equal bilaterally, EKG without acute abnormalities, negative troponin, and negative CXR.  Patient is ambulatory in the ED without any chest discomfort, lightheadedness or dizziness.  Patient is to be discharged with recommendation to follow up with PCP in regards to today's hospital visit, as well as [facility] regarding her new psychiatric medications.  She is been given notes for work and school tomorrow that she can go to [facility] for walk-in hours tomorrow.  Pt has been advised to return to the ED if CP becomes exertional, associated with diaphoresis or nausea, radiates to left jaw/arm, worsens or becomes concerning in any way. Pt appears reliable for follow up and is agreeable to discharge.     Final Clinical Impressions(s) / ED Diagnoses   Final diagnoses:  Atypical chest pain    ED Discharge Orders    None       Dartha Lodge, [place] 11/21/17 1805    Pricilla Loveless, MD 11/23/17 [id number]

## 2017-11-21 NOTE — ED Provider Notes (Signed)
Patient placed in Quick Look pathway, seen and evaluated   Chief Complaint: Lightheadedness, blurred vision, chest pain  HPI: Patient presents with 1 week history of intermittent left-sided chest pain.  Pain is a "pinching ", radiates up to the left shoulder, worsens with movement, improves with laying flat and resting.  Denies associated diaphoresis, endorses mild shortness of breath today but thinks this was secondary to "walking down a hill ".  Also notes intermittent blurred vision and generalized weakness.  She states she feels lightheaded specifically with position changes.  Has had one episode of nonbloody nonbilious emesis daily for the past 4 days, denies abdominal pain.  She was seen and evaluated at behavioral health, recently discharged and started on Abilify, trazodone, and Prozac.  States "I think I went back to work too soon".  ROS:   Physical Exam:   Gen: No distress  Neuro: Awake and Alert  Skin: Warm    Focused Exam: Heart regular rate and rhythm, no murmurs rubs or gallops noted.  Lungs clear to auscultation bilaterally.  Left anterior upper chest wall tender to palpation focally with no underlying crepitus, ecchymosis, flail segment, or deformity.  5/5 strength of BUE and BLE major muscle groups.  Cranial nerves II through XII tested and intact.  Romberg sign absent.  No pronator drift.  No nystagmus.  Subjective numbness to soft touch of the left side of the face, upper and lower extremities.  Ambulatory without difficulty.  2+ radial and DP/PT pulses bilateral   Initiation of care has begun. The patient has been counseled on the process, plan, and necessity for staying for the completion/evaluation, and the remainder of the medical screening examination    Jeanie SewerFawze, Latandra Loureiro A, PA-C 11/21/17 1418    Cathren LaineSteinl, Kevin, MD 11/21/17 1429

## 2017-11-21 NOTE — ED Notes (Signed)
Pt found. Pt she had went to her car.

## 2017-11-21 NOTE — ED Notes (Signed)
Pt reports new prescription meds - Abilify, Hydroxyzine, and Trazodone. Her existing Prozac dosage was increased. These meds were prescribed last week and she believes that the symptoms are related to the new meds.

## 2017-11-21 NOTE — ED Notes (Signed)
Called for room with no answer.  °

## 2017-11-21 NOTE — ED Triage Notes (Signed)
Pt states chest pain, blurred vision, generalized weakness X1 week. She states she had suicide attempt and was [facility]. She states she was started on abilify, prozac, and trazadone. Pt denies SI but states she feels as though she can barely stand without passing out.

## 2017-11-21 NOTE — Discharge Instructions (Addendum)
Your evaluation today is very reassuring and does not suggest an acute problem with your heart or lungs causing your symptoms.  May use Tylenol, ice and heat over the area where you are having chest discomfort as I think this is likely musculoskeletal in nature.  Please follow-up with [facility] regarding your new medications and the potential side effects you are having.  Return to the emergency department for persisting chest pain, shortness of breath if you have episodes where you pass out or any other new or concerning symptoms.  [facility] [address] [place], [place] [address] [phone] Description: A variety of behavioral health services are offered, including: Open Access  Outpatient Therapy & Psychiatric Services  Assertive Community Treatment Team (ACTT) (adults only)  Crisis Assessment Services Center (children & adults)  Assertive Engagement (children & adults)  Peer Support Services Referrals may be made to the facility-based crisis center 24/7, 365 days a year. Walk-ins are always welcome or you may call ahead to speak with a [facility] staff member regarding availability (preferred method).

## 2018-04-02 ENCOUNTER — Emergency Department (HOSPITAL_COMMUNITY)
Admission: EM | Admit: 2018-04-02 | Discharge: 2018-04-02 | Discharge status: Against Medical Advice | Payer: Medicaid Other | Attending: Emergency Medicine | Admitting: Emergency Medicine

## 2018-04-02 ENCOUNTER — Encounter (HOSPITAL_COMMUNITY): Payer: Self-pay | Admitting: Emergency Medicine

## 2018-04-02 ENCOUNTER — Emergency Department (HOSPITAL_COMMUNITY): Payer: Medicaid Other

## 2018-04-02 DIAGNOSIS — Z5321 Procedure and treatment not carried out due to patient leaving prior to being seen by health care provider: Secondary | ICD-10-CM | POA: Insufficient documentation

## 2018-04-02 DIAGNOSIS — R5383 Other fatigue: Secondary | ICD-10-CM | POA: Insufficient documentation

## 2018-04-02 DIAGNOSIS — R05 Cough: Secondary | ICD-10-CM | POA: Insufficient documentation

## 2018-04-02 NOTE — ED Notes (Signed)
Called pt. X 3 . No answer in lobby

## 2018-04-02 NOTE — ED Notes (Signed)
Called x3 for room, no answer. 

## 2018-04-02 NOTE — ED Triage Notes (Addendum)
Pt here for eval of flu like symptoms, states her coworker has it. She has fatigue, cough, loss of appetite. Unsure of mucous color. She has had her symptoms for 4 days. Pt coughing at triage. Pt given a mask.

## 2018-04-03 ENCOUNTER — Emergency Department (HOSPITAL_COMMUNITY)
Admission: EM | Admit: 2018-04-03 | Discharge: 2018-04-03 | Disposition: A | Discharge status: Home / Self Care | Payer: Medicaid Other | Attending: Emergency Medicine | Admitting: Emergency Medicine

## 2018-04-03 ENCOUNTER — Encounter (HOSPITAL_COMMUNITY): Payer: Self-pay

## 2018-04-03 ENCOUNTER — Other Ambulatory Visit: Payer: Self-pay

## 2018-04-03 DIAGNOSIS — J45909 Unspecified asthma, uncomplicated: Secondary | ICD-10-CM | POA: Insufficient documentation

## 2018-04-03 DIAGNOSIS — F319 Bipolar disorder, unspecified: Secondary | ICD-10-CM | POA: Insufficient documentation

## 2018-04-03 DIAGNOSIS — J111 Influenza due to unidentified influenza virus with other respiratory manifestations: Secondary | ICD-10-CM | POA: Insufficient documentation

## 2018-04-03 DIAGNOSIS — J101 Influenza due to other identified influenza virus with other respiratory manifestations: Secondary | ICD-10-CM

## 2018-04-03 DIAGNOSIS — F17210 Nicotine dependence, cigarettes, uncomplicated: Secondary | ICD-10-CM | POA: Insufficient documentation

## 2018-04-03 DIAGNOSIS — Z79899 Other long term (current) drug therapy: Secondary | ICD-10-CM | POA: Insufficient documentation

## 2018-04-03 DIAGNOSIS — F909 Attention-deficit hyperactivity disorder, unspecified type: Secondary | ICD-10-CM | POA: Insufficient documentation

## 2018-04-03 LAB — INFLUENZA PANEL BY PCR (TYPE A & B)
Influenza A By PCR: NEGATIVE
Influenza B By PCR: POSITIVE — AB

## 2018-04-03 LAB — I-STAT TROPONIN, ED: Troponin i, poc: 0 ng/mL (ref 0.00–0.08)

## 2018-04-03 LAB — I-STAT BETA HCG BLOOD, ED (MC, WL, AP ONLY): I-stat hCG, quantitative: <5 m[IU]/mL (ref <5)

## 2018-04-03 MED ORDER — ALBUTEROL SULFATE (2.5 MG/3ML) 0.083% IN NEBU
5.0000 mg | INHALATION_SOLUTION | Freq: Once | RESPIRATORY_TRACT | Status: AC
  Administered 2018-04-03: 5 mg via RESPIRATORY_TRACT
  Filled 2018-04-03: qty 6

## 2018-04-03 MED ORDER — CHLORPHENIRAMINE MALEATE 2 MG/5ML PO SYRP: 2.0000 mg | ORAL_SOLUTION | ORAL | 0 refills | Status: DC | PRN

## 2018-04-03 NOTE — ED Provider Notes (Signed)
[facility] EMERGENCY [facility] Provider Note   CSN: [id number] Arrival date & time: 04/03/18  1313     History   Chief Complaint Chief Complaint  Patient presents with  . FLu like symptoms  . Chest Pain    HPI Caroline Ingram is a 32 y.o. female.  HPI Patient presents with concern of cough, fever, nausea, vomiting. Patient is a smoker, has a history of asthma.  When she continues to smoke cigarettes, though less since onset of illness.   Smoking cessation provided, particularly in light of this patient's evaluation in the ED.  This illness began about 5 days ago, initially with fever, cough. Since that time fever has subsided, but she has persistent discomfort, cough, and nausea. No vomiting and about 1 day. No medication taken for pain relief. Patient did not receive her influenza vaccine this year. She has some chest discomfort with coughing, otherwise no chest pain. She was here yesterday, but left prior to evaluation. Past Medical History:  Diagnosis Date  . ADHD   . Asthma   . Depression     Patient Active Problem List   Diagnosis Date Noted  . Bipolar 2 disorder, major depressive episode (HCC) 11/14/2017  . 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 ANEMIA 01/13/2009  . Substance induced mood disorder (HCC) 01/13/2009  . DEPRESSION 01/13/2009  . ASTHMA 01/13/2009  . SYNCOPE 01/13/2009    History reviewed. No pertinent surgical history.   OB History   No obstetric history on file.      Home Medications    Prior to Admission medications   Medication Sig Start Date End Date Taking? Authorizing Provider  amLODipine (NORVASC) 5 MG tablet Take 1 tablet (5 mg total) by mouth daily. 11/16/17   Denzil Magnusonhomas, Lashunda, NP  amphetamine-dextroamphetamine (ADDERALL) 20 MG tablet Take 20 mg by mouth daily. 11/09/17   [provider]  ARIPiprazole (ABILIFY) 10 MG tablet Take 1 tablet (10 mg total) by mouth every  morning. 11/17/17   Denzil Magnusonhomas, Lashunda, NP  ARIPiprazole ER (ABILIFY MAINTENA) 400 MG SRER injection Inject 2 mLs (400 mg total) into the muscle every 28 (twenty-eight) days. 01/12/18   Denzil Magnusonhomas, Lashunda, NP  DULERA 100-5 MCG/ACT AERO Inhale 2 puffs into the lungs 2 (two) times daily. 08/21/17   [provider]  FLUoxetine (PROZAC) 40 MG capsule Take 1 capsule (40 mg total) by mouth daily. 11/17/17   Denzil Magnusonhomas, Lashunda, NP  hydrOXYzine (ATARAX/VISTARIL) 25 MG tablet Take 1 tablet (25 mg total) by mouth 3 (three) times daily as needed for anxiety. 11/16/17   Denzil Magnusonhomas, Lashunda, NP  PROAIR HFA 108 (90 Base) MCG/ACT inhaler INHALE 2 PUFFS BY MOUTH 4 (FOUR) TIMES A DAY 08/21/17   [provider]  traZODone (DESYREL) 100 MG tablet Take 1 tablet (100 mg total) by mouth at bedtime. 11/16/17   Denzil Magnusonhomas, Lashunda, NP    Family History No family history on file.  Social History Social History   Tobacco Use  . Smoking status: Current Every Day Smoker    Packs/day: 1.00    Types: Cigarettes  . Smokeless tobacco: Never Used  Substance Use Topics  . Alcohol use: Not Currently    Frequency: Never  . Drug use: Never     Allergies   Chocolate and Shrimp [shellfish allergy]   Review of Systems Review of Systems  Constitutional:       Per HPI, otherwise negative  HENT:       Per HPI,  otherwise negative  Respiratory:       Per HPI, otherwise negative  Cardiovascular:       Per HPI, otherwise negative  Gastrointestinal: Positive for nausea and vomiting.  Endocrine:       Negative aside from HPI  Genitourinary:       Neg aside from HPI   Musculoskeletal:       Per HPI, otherwise negative  Skin: Negative.   Neurological: Negative for syncope.     Physical Exam Updated Vital Signs BP (!) 129/92   Pulse 72   Temp 99.4 F (37.4 C) (Oral)   Resp 14   LMP 03/26/2018   SpO2 96%   Physical Exam Vitals signs and nursing note reviewed.  Constitutional:      General: She is not in acute  distress.    Appearance: She is well-developed.  HENT:     Head: Normocephalic and atraumatic.  Eyes:     Conjunctiva/sclera: Conjunctivae normal.  Cardiovascular:     Rate and Rhythm: Normal rate and regular rhythm.  Pulmonary:     Effort: Pulmonary effort is normal. No respiratory distress.     Breath sounds: No stridor. Decreased breath sounds present. No wheezing.  Abdominal:     General: There is no distension.  Skin:    General: Skin is warm and dry.  Neurological:     Mental Status: She is alert and oriented to person, place, and time.     Cranial Nerves: No cranial nerve deficit.      ED Treatments / Results  Labs (all labs ordered are listed, but only abnormal results are displayed) Labs Reviewed  INFLUENZA PANEL BY PCR (TYPE A & B) - Abnormal; Notable for the following components:      Result Value   Influenza B By PCR POSITIVE (*)    All other components within normal limits  I-STAT TROPONIN, ED  I-STAT BETA HCG BLOOD, ED ([facility], [facility], [facility] ONLY)    EKG EKG Interpretation  Date/Time:  Tuesday April 03 2018 13:20:39 EST Ventricular Rate:  75 PR Interval:  152 QRS Duration: 86 QT Interval:  396 QTC Calculation: 442 R Axis:   -26 Text Interpretation:  Normal sinus rhythm T wave abnormality No significant change since last tracing Abnormal ekg Confirmed by Gerhard Munch [phone]) on 04/03/2018 1:28:44 PM   Radiology Dg Chest 2 View  Result Date: 04/02/2018 CLINICAL DATA:  Cough and shortness of breath. Right chest pain. Fever. Smoker. EXAM: CHEST - 2 VIEW COMPARISON:  11/21/2017. FINDINGS: The cardiac silhouette is near the upper limit of normal in size with a mild increase in size. Clear lungs with normal vascularity. Mild to moderate peribronchial thickening. Unremarkable bones. IMPRESSION: Mild to moderate bronchitic changes. Electronically Signed   By: Beckie Salts M.D.   On: 04/02/2018 18:20    Procedures Procedures (including critical care  time)  Medications Ordered in ED Medications  albuterol (PROVENTIL) (2.5 MG/3ML) 0.083% nebulizer solution 5 mg (5 mg Nebulization Given 04/03/18 1355)     Initial Impression / Assessment and Plan / ED Course  I have reviewed the triage vital signs and the nursing notes.  Pertinent labs & imaging results that were available during my care of the patient were reviewed by me and considered in my medical decision making (see chart for details).     3:30 PM Patient in no distress, feels better after albuterol treatment.  Discussed all findings including positive result influenza B. I reviewed the patient's x-ray from  yesterday, no evidence for pneumonia. Patient is afebrile, awake, alert, and after lengthy conversation on home care instructions for influenza she was discharged in stable condition.  Final Clinical Impressions(s) / ED Diagnoses  Influenza   Gerhard Munch, MD 04/03/18 1530

## 2018-04-03 NOTE — ED Triage Notes (Signed)
Pt here for flu like symptoms that started Friday (cough, congestion, fever) and chest pain that started Friday as well.

## 2018-04-03 NOTE — ED Notes (Signed)
Pt stable, ambulatory, states understanding of discharge instructions 

## 2018-04-28 ENCOUNTER — Other Ambulatory Visit: Payer: Self-pay

## 2018-04-28 ENCOUNTER — Emergency Department (HOSPITAL_COMMUNITY)
Admission: EM | Admit: 2018-04-28 | Discharge: 2018-04-28 | Disposition: A | Discharge status: Home / Self Care | Payer: Medicaid Other | Attending: Emergency Medicine | Admitting: Emergency Medicine

## 2018-04-28 DIAGNOSIS — R6884 Jaw pain: Secondary | ICD-10-CM | POA: Insufficient documentation

## 2018-04-28 DIAGNOSIS — F909 Attention-deficit hyperactivity disorder, unspecified type: Secondary | ICD-10-CM | POA: Insufficient documentation

## 2018-04-28 DIAGNOSIS — J45909 Unspecified asthma, uncomplicated: Secondary | ICD-10-CM | POA: Insufficient documentation

## 2018-04-28 DIAGNOSIS — F17210 Nicotine dependence, cigarettes, uncomplicated: Secondary | ICD-10-CM | POA: Insufficient documentation

## 2018-04-28 DIAGNOSIS — Z79899 Other long term (current) drug therapy: Secondary | ICD-10-CM | POA: Insufficient documentation

## 2018-04-28 DIAGNOSIS — K118 Other diseases of salivary glands: Secondary | ICD-10-CM

## 2018-04-28 LAB — GROUP A STREP BY PCR

## 2018-04-28 NOTE — ED Notes (Signed)
Pt stable, ambulatory, states understanding of discharge instructions 

## 2018-04-28 NOTE — ED Triage Notes (Signed)
Per pt, had flu 2 weeks ago. Now c/o neck pain and swollen lymph nodes. Pain radiates to left ear.

## 2018-04-28 NOTE — ED Provider Notes (Signed)
[name] EMERGENCY [facility]PARTMENT Provider Note   CSN: [id number] Arrival date & time: 04/28/18  1115     History   Chief Complaint Chief Complaint  Patient presents with  . Facial Pain    HPI Caroline Ingram Caroline Ingram is a 33 y.o. female.  33 year old female presents with complaint of pain in the left side of her throat which radiates to her left ear.  Pain is worse with turning her head side to side or with palpation.  Denies complaint of sore throat or painful swallowing.  Patient was treated for the flu 2 weeks ago, denies any new sick contacts.  Patient states she has a slight lingering cough which she thought was related to her recent flu.  Denies fevers, no other complaints or concerns.     Past Medical History:  Diagnosis Date  . ADHD   . Asthma   . Depression     Patient Active Problem List   Diagnosis Date Noted  . Bipolar 2 disorder, major depressive episode (HCC) 11/14/2017  . 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 ANEMIA 01/13/2009  . Substance induced mood disorder (HCC) 01/13/2009  . DEPRESSION 01/13/2009  . ASTHMA 01/13/2009  . SYNCOPE 01/13/2009    No past surgical history on file.   OB History   No obstetric history on file.      Home Medications    Prior to Admission medications   Medication Sig Start Date End Date Taking? Authorizing Provider  amLODipine (NORVASC) 5 MG tablet Take 1 tablet (5 mg total) by mouth daily. 11/16/17   Denzil Magnusonhomas, Lashunda, NP  amphetamine-dextroamphetamine (ADDERALL) 20 MG tablet Take 20 mg by mouth daily. 11/09/17   [provider]  ARIPiprazole (ABILIFY) 10 MG tablet Take 1 tablet (10 mg total) by mouth every morning. 11/17/17   Denzil Magnusonhomas, Lashunda, NP  ARIPiprazole ER (ABILIFY MAINTENA) 400 MG SRER injection Inject 2 mLs (400 mg total) into the muscle every 28 (twenty-eight) days. 01/12/18   Denzil Magnusonhomas, Lashunda, NP  chlorpheniramine (CHLOR-TRIMETON) 2 MG/5ML syrup Take 5 mLs (2  mg total) by mouth every 4 (four) hours as needed for allergies. 04/03/18   Gerhard MunchLockwood, Robert, MD  DULERA 100-5 MCG/ACT AERO Inhale 2 puffs into the lungs 2 (two) times daily. 08/21/17   [provider]  FLUoxetine (PROZAC) 40 MG capsule Take 1 capsule (40 mg total) by mouth daily. 11/17/17   Denzil Magnusonhomas, Lashunda, NP  hydrOXYzine (ATARAX/VISTARIL) 25 MG tablet Take 1 tablet (25 mg total) by mouth 3 (three) times daily as needed for anxiety. 11/16/17   Denzil Magnusonhomas, Lashunda, NP  PROAIR HFA 108 (90 Base) MCG/ACT inhaler INHALE 2 PUFFS BY MOUTH 4 (FOUR) TIMES A DAY 08/21/17   [provider]  traZODone (DESYREL) 100 MG tablet Take 1 tablet (100 mg total) by mouth at bedtime. 11/16/17   Denzil Magnusonhomas, Lashunda, NP    Family History No family history on file.  Social History Social History   Tobacco Use  . Smoking status: Current Every Day Smoker    Packs/day: 1.00    Types: Cigarettes  . Smokeless tobacco: Never Used  Substance Use Topics  . Alcohol use: Not Currently    Frequency: Never  . Drug use: Never     Allergies   Chocolate and Shrimp [shellfish allergy]   Review of Systems Review of Systems  Constitutional: Negative for chills and fever.  HENT: Positive for ear pain. Negative for congestion, rhinorrhea, sinus pressure, sinus pain, sneezing and sore throat.  Eyes: Negative for discharge and redness.  Respiratory: Positive for cough. Negative for wheezing.   Musculoskeletal: Negative for arthralgias and myalgias.  Skin: Negative for rash and wound.  Allergic/Immunologic: Negative for immunocompromised state.  Neurological: Negative for weakness and headaches.  Hematological: Positive for adenopathy.  Psychiatric/Behavioral: Negative for confusion.  All other systems reviewed and are negative.    Physical Exam Updated Vital Signs BP (!) 151/79 (BP Location: Right Arm)   Pulse (!) 58   Temp 98.9 F (37.2 C) (Oral)   Resp 16   Ht 5\' 4"  (1.626 m)   Wt 88 kg   SpO2 100%    BMI 33.30 kg/m   Physical Exam Vitals signs and nursing note reviewed.  Constitutional:      General: She is not in acute distress.    Appearance: She is well-developed. She is not diaphoretic.  HENT:     Head: Normocephalic and atraumatic.     Right Ear: Tympanic membrane, ear canal and external ear normal.     Left Ear: Tympanic membrane, ear canal and external ear normal.     Mouth/Throat:     Mouth: Mucous membranes are moist.     Dentition: No dental abscesses.     Pharynx: Uvula midline. No oropharyngeal exudate, posterior oropharyngeal erythema or uvula swelling.     Tonsils: Swelling: 1+ on the right. 1+ on the left.  Eyes:     Conjunctiva/sclera: Conjunctivae normal.  Neck:     Musculoskeletal: Muscular tenderness present.      Comments: Submandibular tenderness without fullness/swelling, no palpable LNs, no tenderness over parotid gland.  Cardiovascular:     Rate and Rhythm: Normal rate and regular rhythm.     Pulses: Normal pulses.     Heart sounds: Normal heart sounds. No murmur.  Pulmonary:     Effort: Pulmonary effort is normal.     Breath sounds: Normal breath sounds.  Lymphadenopathy:     Cervical: No cervical adenopathy.  Skin:    General: Skin is warm and dry.     Findings: No erythema or rash.  Neurological:     Mental Status: She is alert and oriented to person, place, and time.  Psychiatric:        Behavior: Behavior normal.      ED Treatments / Results  Labs (all labs ordered are listed, but only abnormal results are displayed) Labs Reviewed  GROUP A STREP BY PCR    EKG None  Radiology No results found.  Procedures Procedures (including critical care time)  Medications Ordered in ED Medications - No data to display   Initial Impression / Assessment and Plan / ED Course  I have reviewed the triage vital signs and the nursing notes.  Pertinent labs & imaging results that were available during my care of the patient were reviewed  by me and considered in my medical decision making (see chart for details).  Clinical Course as of Apr 28 1309  Sat Apr 28, 2018  2526 33 year old female presents with complaint of left submandibular pain without dental source for infection, pain worse with palpation and movement of her head.  On exam patient is well-appearing, there is no trismus, no dental abscess, no palpable cervical and 5 neuropathy.  Patient's rapid strep test was sent off and an hour and a half later informed by lab that they results were invalid and needs to be re-collected.  Plan is to recollect and patient to call for her results later tonight.  I do not suspect strep throat, suspect that she has a salivary gland stone or viral illness affecting her submandibular gland.  Recommend warm compresses, gentle massage, cycle limited candies.  Patient can follow-up with PCP if pain persists or return to ER as needed.  She was given work note as requested.   [LM]    Clinical Course User Index [LM] Jeannie Fend, PA-C   Final Clinical Impressions(s) / ED Diagnoses   Final diagnoses:  Pain of submandibular gland    ED Discharge Orders    None       Alden Hipp 04/28/18 1312    Terrilee Files, MD 04/28/18 [phone]

## 2018-04-28 NOTE — ED Notes (Signed)
Spoke with Caroline Ingram in [employer] regarding pending results.  Informed this Clinical research associate that the results said invalid and he will have to repeat the test. Caroline Ingram notified it should take another 20 minutes.

## 2018-04-28 NOTE — Discharge Instructions (Addendum)
Call the hospital for your test results this evening. You have pain in your submandibular gland. Apply warm compresses, gently massage area. Suck on lemon candies.  Follow up with PCP if not improving.

## 2018-05-03 ENCOUNTER — Emergency Department (HOSPITAL_COMMUNITY)
Admission: EM | Admit: 2018-05-03 | Discharge: 2018-05-03 | Disposition: A | Discharge status: Home / Self Care | Payer: Medicaid Other | Attending: Emergency Medicine | Admitting: Emergency Medicine

## 2018-05-03 ENCOUNTER — Encounter: Payer: Self-pay | Admitting: Emergency Medicine

## 2018-05-03 DIAGNOSIS — Z79899 Other long term (current) drug therapy: Secondary | ICD-10-CM | POA: Insufficient documentation

## 2018-05-03 DIAGNOSIS — J45909 Unspecified asthma, uncomplicated: Secondary | ICD-10-CM | POA: Insufficient documentation

## 2018-05-03 DIAGNOSIS — Z7689 Persons encountering health services in other specified circumstances: Secondary | ICD-10-CM

## 2018-05-03 DIAGNOSIS — R110 Nausea: Secondary | ICD-10-CM | POA: Insufficient documentation

## 2018-05-03 DIAGNOSIS — Z0279 Encounter for issue of other medical certificate: Secondary | ICD-10-CM | POA: Insufficient documentation

## 2018-05-03 DIAGNOSIS — F17210 Nicotine dependence, cigarettes, uncomplicated: Secondary | ICD-10-CM | POA: Insufficient documentation

## 2018-05-03 DIAGNOSIS — F909 Attention-deficit hyperactivity disorder, unspecified type: Secondary | ICD-10-CM | POA: Insufficient documentation

## 2018-05-03 NOTE — Discharge Instructions (Signed)
Return to ED for worsening symptoms, severe abdominal pain, chest pain or shortness of breath.

## 2018-05-03 NOTE — ED Notes (Signed)
Patient verbalizes understanding of discharge instructions. Opportunity for questioning and answers were provided. Armband removed by staff, pt discharged from ED. Pt ambulatory to lobby.  

## 2018-05-03 NOTE — ED Provider Notes (Signed)
[facility] EMERGENCY DEPARTMENT Provider Note   CSN: [id number] Arrival date & time: 05/03/18  1638     History   Chief Complaint Chief Complaint  Patient presents with  . Nausea  . Discharge Note    HPI Caroline Ingram is a 33 y.o. female who presents to ED requesting work note.  States that last night, she had nausea and several episodes of diarrhea.  She did not feel like going to work today.  States that her symptoms have resolved.  However, she was told by her job that she could only return if she had a doctor's note.  She denies any abdominal pain, fever, blood in stool.  She denies any complaints.  HPI  Past Medical History:  Diagnosis Date  . ADHD   . Asthma   . Depression     Patient Active Problem List   Diagnosis Date Noted  . Bipolar 2 disorder, major depressive episode (HCC) 11/14/2017  . 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 ANEMIA 01/13/2009  . Substance induced mood disorder (HCC) 01/13/2009  . DEPRESSION 01/13/2009  . ASTHMA 01/13/2009  . SYNCOPE 01/13/2009    No past surgical history on file.   OB History   No obstetric history on file.      Home Medications    Prior to Admission medications   Medication Sig Start Date End Date Taking? Authorizing Provider  amLODipine (NORVASC) 5 MG tablet Take 1 tablet (5 mg total) by mouth daily. 11/16/17   Denzil Magnuson, NP  amphetamine-dextroamphetamine (ADDERALL) 20 MG tablet Take 20 mg by mouth daily. 11/09/17   [provider]  ARIPiprazole (ABILIFY) 10 MG tablet Take 1 tablet (10 mg total) by mouth every morning. 11/17/17   Denzil Magnuson, NP  ARIPiprazole ER (ABILIFY MAINTENA) 400 MG SRER injection Inject 2 mLs (400 mg total) into the muscle every 28 (twenty-eight) days. 01/12/18   Denzil Magnuson, NP  chlorpheniramine (CHLOR-TRIMETON) 2 MG/5ML syrup Take 5 mLs (2 mg total) by mouth every 4 (four) hours as needed for allergies. 04/03/18    Gerhard Munch, MD  DULERA 100-5 MCG/ACT AERO Inhale 2 puffs into the lungs 2 (two) times daily. 08/21/17   [provider]  FLUoxetine (PROZAC) 40 MG capsule Take 1 capsule (40 mg total) by mouth daily. 11/17/17   Denzil Magnuson, NP  hydrOXYzine (ATARAX/VISTARIL) 25 MG tablet Take 1 tablet (25 mg total) by mouth 3 (three) times daily as needed for anxiety. 11/16/17   Denzil Magnuson, NP  PROAIR HFA 108 (90 Base) MCG/ACT inhaler INHALE 2 PUFFS BY MOUTH 4 (FOUR) TIMES A DAY 08/21/17   [provider]  traZODone (DESYREL) 100 MG tablet Take 1 tablet (100 mg total) by mouth at bedtime. 11/16/17   Denzil Magnuson, NP    Family History No family history on file.  Social History Social History   Tobacco Use  . Smoking status: Current Every Day Smoker    Packs/day: 1.00    Types: Cigarettes  . Smokeless tobacco: Never Used  Substance Use Topics  . Alcohol use: Not Currently    Frequency: Never  . Drug use: Never     Allergies   Chocolate and Shrimp [shellfish allergy]   Review of Systems Review of Systems  Constitutional: Negative for fever.  Gastrointestinal: Positive for diarrhea and nausea. Negative for abdominal pain.     Physical Exam Updated Vital Signs BP 138/78   Pulse 78   Temp 98.7 F (37.1  C) (Oral)   Resp 16   LMP 04/19/2018 (Approximate)   SpO2 100%   Physical Exam Vitals signs and nursing note reviewed.  Constitutional:      General: She is not in acute distress.    Appearance: She is well-developed. She is not diaphoretic.  HENT:     Head: Normocephalic and atraumatic.  Eyes:     General: No scleral icterus.    Conjunctiva/sclera: Conjunctivae normal.  Neck:     Musculoskeletal: Normal range of motion.  Pulmonary:     Effort: Pulmonary effort is normal. No respiratory distress.  Abdominal:     Palpations: There is no mass.     Tenderness: There is no abdominal tenderness.  Skin:    Findings: No rash.  Neurological:     Mental  Status: She is alert.      ED Treatments / Results  Labs (all labs ordered are listed, but only abnormal results are displayed) Labs Reviewed - No data to display  EKG None  Radiology No results found.  Procedures Procedures (including critical care time)  Medications Ordered in ED Medications - No data to display   Initial Impression / Assessment and Plan / ED Course  I have reviewed the triage vital signs and the nursing notes.  Pertinent labs & imaging results that were available during my care of the patient were reviewed by me and considered in my medical decision making (see chart for details).     33 year old female presents to ED requesting note to return to work.  States that she felt nauseous and had several episodes of diarrhea last night.  She did not feel like going to work this morning.  Her job stated that she needed a doctor's note to return tomorrow.  She denies any complaints currently.  She is overall well-appearing.  Vital signs within normal limits.  Abdomen is soft, nontender nondistended.  She is able to tolerate p.o. intake without difficulty here.  We will provide her with a work note and have her follow-up with her PCP.  Patient is hemodynamically stable, in NAD, and able to ambulate in the ED. Evaluation does not show pathology that would require ongoing emergent intervention or inpatient treatment. I explained the diagnosis to the patient. Pain has been managed and has no complaints prior to discharge. Patient is comfortable with above plan and is stable for discharge at this time. All questions were answered prior to disposition. Strict return precautions for returning to the ED were discussed. Encouraged follow up with PCP.    Portions of this note were generated with Scientist, clinical (histocompatibility and immunogenetics)Dragon dictation software. Dictation errors may occur despite best attempts at proofreading.   Final Clinical Impressions(s) / ED Diagnoses   Final diagnoses:  Return to work exam    Nausea    ED Discharge Orders    None       Dietrich PatesKhatri, Devaney Segers, [place]PA-C 05/03/18 1842    Tegeler, Canary Brimhristopher J, MD 05/03/18 [phone]

## 2018-05-03 NOTE — ED Triage Notes (Signed)
Pt endorses episode of nausea last night and had diarrhea, pt states that sx resolved but she was told by her job that she had to come get a dr's note to go back to work. VSS.

## 2018-05-14 ENCOUNTER — Other Ambulatory Visit: Payer: Self-pay

## 2018-05-14 ENCOUNTER — Encounter (HOSPITAL_COMMUNITY): Payer: Self-pay | Admitting: Emergency Medicine

## 2018-05-14 ENCOUNTER — Emergency Department (HOSPITAL_COMMUNITY)
Admission: EM | Admit: 2018-05-14 | Discharge: 2018-05-14 | Disposition: A | Discharge status: Home / Self Care | Payer: Medicaid Other | Attending: Emergency Medicine | Admitting: Emergency Medicine

## 2018-05-14 DIAGNOSIS — J45909 Unspecified asthma, uncomplicated: Secondary | ICD-10-CM | POA: Insufficient documentation

## 2018-05-14 DIAGNOSIS — R112 Nausea with vomiting, unspecified: Secondary | ICD-10-CM | POA: Insufficient documentation

## 2018-05-14 DIAGNOSIS — F909 Attention-deficit hyperactivity disorder, unspecified type: Secondary | ICD-10-CM | POA: Insufficient documentation

## 2018-05-14 DIAGNOSIS — F17210 Nicotine dependence, cigarettes, uncomplicated: Secondary | ICD-10-CM | POA: Insufficient documentation

## 2018-05-14 DIAGNOSIS — Z79899 Other long term (current) drug therapy: Secondary | ICD-10-CM | POA: Insufficient documentation

## 2018-05-14 DIAGNOSIS — R197 Diarrhea, unspecified: Secondary | ICD-10-CM | POA: Insufficient documentation

## 2018-05-14 MED ORDER — ONDANSETRON 4 MG PO TBDP: 4.0000 mg | ORAL_TABLET | Freq: Three times a day (TID) | ORAL | 0 refills | Status: DC | PRN

## 2018-05-14 NOTE — ED Triage Notes (Signed)
States  Had  Diarrhea and vomiting and she called out of work and needs a dr note,  States no more diarrhea

## 2018-05-14 NOTE — ED Provider Notes (Signed)
[facility] EMERGENCY DEPARTMENT Provider Note   CSN: [id number] Arrival date & time: 05/14/18  1101     History   Chief Complaint Chief Complaint  Patient presents with  . Diarrhea    HPI Caroline Ingram is a 33 y.o. female.  33 yo F with a cc of nausea vomiting diarrhea.  This started yesterday and is resolved.  This is going around at her work.  She had a similar illness a couple weeks ago and was seen in the ED.  She had fevers and chills at the onset and it resolved.  Now is able to eat and drink without difficulty denies vaginal bleeding or discharge denies.   The history is provided by the patient.  Diarrhea  Associated symptoms: no abdominal pain, no arthralgias, no chills, no fever, no headaches, no myalgias and no vomiting   Illness  Severity:  Mild Onset quality:  Sudden Duration:  1 day Timing:  Constant Progression:  Resolved Chronicity:  Recurrent Associated symptoms: diarrhea   Associated symptoms: no abdominal pain, no chest pain, no congestion, no fever, no headaches, no myalgias, no nausea, no rhinorrhea, no shortness of breath, no vomiting and no wheezing     Past Medical History:  Diagnosis Date  . ADHD   . Asthma   . Depression     Patient Active Problem List   Diagnosis Date Noted  . Bipolar 2 disorder, major depressive episode (HCC) 11/14/2017  . 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 ANEMIA 01/13/2009  . Substance induced mood disorder (HCC) 01/13/2009  . DEPRESSION 01/13/2009  . ASTHMA 01/13/2009  . SYNCOPE 01/13/2009    History reviewed. No pertinent surgical history.   OB History   No obstetric history on file.      Home Medications    Prior to Admission medications   Medication Sig Start Date End Date Taking? Authorizing Provider  amLODipine (NORVASC) 5 MG tablet Take 1 tablet (5 mg total) by mouth daily. 11/16/17   Denzil Magnuson, NP  amphetamine-dextroamphetamine  (ADDERALL) 20 MG tablet Take 20 mg by mouth daily. 11/09/17   [provider]  ARIPiprazole (ABILIFY) 10 MG tablet Take 1 tablet (10 mg total) by mouth every morning. 11/17/17   Denzil Magnuson, NP  ARIPiprazole ER (ABILIFY MAINTENA) 400 MG SRER injection Inject 2 mLs (400 mg total) into the muscle every 28 (twenty-eight) days. 01/12/18   Denzil Magnuson, NP  chlorpheniramine (CHLOR-TRIMETON) 2 MG/5ML syrup Take 5 mLs (2 mg total) by mouth every 4 (four) hours as needed for allergies. 04/03/18   Gerhard Munch, MD  DULERA 100-5 MCG/ACT AERO Inhale 2 puffs into the lungs 2 (two) times daily. 08/21/17   [provider]  FLUoxetine (PROZAC) 40 MG capsule Take 1 capsule (40 mg total) by mouth daily. 11/17/17   Denzil Magnuson, NP  hydrOXYzine (ATARAX/VISTARIL) 25 MG tablet Take 1 tablet (25 mg total) by mouth 3 (three) times daily as needed for anxiety. 11/16/17   Denzil Magnuson, NP  ondansetron (ZOFRAN ODT) 4 MG disintegrating tablet Take 1 tablet (4 mg total) by mouth every 8 (eight) hours as needed for nausea or vomiting. 05/14/18   Melene Plan, DO  PROAIR HFA 108 (90 Base) MCG/ACT inhaler INHALE 2 PUFFS BY MOUTH 4 (FOUR) TIMES A DAY 08/21/17   [provider]  traZODone (DESYREL) 100 MG tablet Take 1 tablet (100 mg total) by mouth at bedtime. 11/16/17   Denzil Magnuson, NP    Family  History No family history on file.  Social History Social History   Tobacco Use  . Smoking status: Current Every Day Smoker    Packs/day: 1.00    Types: Cigarettes  . Smokeless tobacco: Never Used  Substance Use Topics  . Alcohol use: Not Currently    Frequency: Never  . Drug use: Never     Allergies   Chocolate and Shrimp [shellfish allergy]   Review of Systems Review of Systems  Constitutional: Negative for chills and fever.  HENT: Negative for congestion and rhinorrhea.   Eyes: Negative for redness and visual disturbance.  Respiratory: Negative for shortness of breath and  wheezing.   Cardiovascular: Negative for chest pain and palpitations.  Gastrointestinal: Positive for diarrhea. Negative for abdominal pain, nausea and vomiting.  Genitourinary: Negative for dysuria and urgency.  Musculoskeletal: Negative for arthralgias and myalgias.  Skin: Negative for pallor and wound.  Neurological: Negative for dizziness and headaches.     Physical Exam Updated Vital Signs BP 109/72 (BP Location: Right Arm)   Pulse 83   Temp 98.3 F (36.8 C)   Resp 16   LMP 04/19/2018 (Approximate)   SpO2 100%   Physical Exam Vitals signs and nursing note reviewed.  Constitutional:      General: She is not in acute distress.    Appearance: She is well-developed. She is not diaphoretic.  HENT:     Head: Normocephalic and atraumatic.  Eyes:     Pupils: Pupils are equal, round, and reactive to light.  Neck:     Musculoskeletal: Normal range of motion and neck supple.  Cardiovascular:     Rate and Rhythm: Normal rate and regular rhythm.     Heart sounds: No murmur. No friction rub. No gallop.   Pulmonary:     Effort: Pulmonary effort is normal.     Breath sounds: No wheezing or rales.  Abdominal:     General: There is no distension.     Palpations: Abdomen is soft.     Tenderness: There is no abdominal tenderness.  Musculoskeletal:        General: No tenderness.  Skin:    General: Skin is warm and dry.  Neurological:     Mental Status: She is alert and oriented to person, place, and time.  Psychiatric:        Behavior: Behavior normal.      ED Treatments / Results  Labs (all labs ordered are listed, but only abnormal results are displayed) Labs Reviewed - No data to display  EKG None  Radiology No results found.  Procedures Procedures (including critical care time)  Medications Ordered in ED Medications - No data to display   Initial Impression / Assessment and Plan / ED Course  I have reviewed the triage vital signs and the nursing  notes.  Pertinent labs & imaging results that were available during my care of the patient were reviewed by me and considered in my medical decision making (see chart for details).     33 yo F with a chief complaint of nausea vomiting and diarrhea.  She is here really for a work note.  This is her third such visit for the same.  I discussed with her at length that she needs to follow-up with the family physician.  Especially if she is having recurrent nausea vomiting and diarrhea, she might need to be further evaluated may be sent to gastroenterology should this persist.  She is well-appearing and nontoxic on my exam  she has no abdominal pain.  Tolerating p.o.  Discharge home.  11:39 AM:  I have discussed the diagnosis/risks/treatment options with the patient and family and believe the pt to be eligible for discharge home to follow-up with PCP. We also discussed returning to the ED immediately if new or worsening sx occur. We discussed the sx which are most concerning (e.g., sudden worsening pain, fever, inability to tolerate by mouth) that necessitate immediate return. Medications administered to the patient during their visit and any new prescriptions provided to the patient are listed below.  Medications given during this visit Medications - No data to display   The patient appears reasonably screen and/or stabilized for discharge and I doubt any other medical condition or other [facility] requiring further screening, evaluation, or treatment in the ED at this time prior to discharge.    Final Clinical Impressions(s) / ED Diagnoses   Final diagnoses:  Nausea vomiting and diarrhea    ED Discharge Orders         Ordered    ondansetron (ZOFRAN ODT) 4 MG disintegrating tablet  Every 8 hours PRN     05/14/18 1130           Melene PlanFloyd, Gerrett Loman, DO 05/14/18 1139

## 2018-05-14 NOTE — Discharge Instructions (Signed)
Please establish with a family physician.  He has had 2 visits in the past month that required a note to return to work.  If this is a recurrent problem then someone needs to follow along and make sure you do not need further evaluation or possibly referral to a gastroenterologist.  Please return to the emergency department for sudden worsening abdominal pain fever or inability to eat or drink.

## 2018-09-21 ENCOUNTER — Inpatient Hospital Stay (HOSPITAL_COMMUNITY)
Admission: EM | Admit: 2018-09-21 | Discharge: 2018-09-21 | Disposition: A | Discharge status: Home / Self Care | Payer: Medicaid Other | Attending: Obstetrics and Gynecology | Admitting: Obstetrics and Gynecology

## 2018-09-21 ENCOUNTER — Other Ambulatory Visit: Payer: Self-pay

## 2018-09-21 ENCOUNTER — Inpatient Hospital Stay (HOSPITAL_COMMUNITY): Payer: Medicaid Other

## 2018-09-21 ENCOUNTER — Encounter (HOSPITAL_COMMUNITY): Payer: Self-pay | Admitting: Student

## 2018-09-21 DIAGNOSIS — O98811 Other maternal infectious and parasitic diseases complicating pregnancy, first trimester: Secondary | ICD-10-CM | POA: Diagnosis not present

## 2018-09-21 DIAGNOSIS — O99331 Smoking (tobacco) complicating pregnancy, first trimester: Secondary | ICD-10-CM | POA: Insufficient documentation

## 2018-09-21 DIAGNOSIS — O219 Vomiting of pregnancy, unspecified: Secondary | ICD-10-CM

## 2018-09-21 DIAGNOSIS — O10011 Pre-existing essential hypertension complicating pregnancy, first trimester: Secondary | ICD-10-CM | POA: Insufficient documentation

## 2018-09-21 DIAGNOSIS — F17210 Nicotine dependence, cigarettes, uncomplicated: Secondary | ICD-10-CM | POA: Diagnosis not present

## 2018-09-21 DIAGNOSIS — B9689 Other specified bacterial agents as the cause of diseases classified elsewhere: Secondary | ICD-10-CM | POA: Insufficient documentation

## 2018-09-21 DIAGNOSIS — Z3A08 8 weeks gestation of pregnancy: Secondary | ICD-10-CM | POA: Diagnosis not present

## 2018-09-21 DIAGNOSIS — B3731 Acute candidiasis of vulva and vagina: Secondary | ICD-10-CM

## 2018-09-21 DIAGNOSIS — Z3491 Encounter for supervision of normal pregnancy, unspecified, first trimester: Secondary | ICD-10-CM

## 2018-09-21 DIAGNOSIS — R109 Unspecified abdominal pain: Secondary | ICD-10-CM

## 2018-09-21 DIAGNOSIS — N760 Acute vaginitis: Secondary | ICD-10-CM

## 2018-09-21 DIAGNOSIS — B373 Candidiasis of vulva and vagina: Secondary | ICD-10-CM | POA: Diagnosis not present

## 2018-09-21 DIAGNOSIS — O26891 Other specified pregnancy related conditions, first trimester: Secondary | ICD-10-CM

## 2018-09-21 DIAGNOSIS — O10911 Unspecified pre-existing hypertension complicating pregnancy, first trimester: Secondary | ICD-10-CM | POA: Diagnosis not present

## 2018-09-21 DIAGNOSIS — R1031 Right lower quadrant pain: Secondary | ICD-10-CM | POA: Diagnosis not present

## 2018-09-21 LAB — URINALYSIS, ROUTINE W REFLEX MICROSCOPIC
Bilirubin Urine: NEGATIVE
Glucose, UA: NEGATIVE mg/dL
Hgb urine dipstick: NEGATIVE
Ketones, ur: NEGATIVE mg/dL
Leukocytes,Ua: NEGATIVE
Nitrite: NEGATIVE
Protein, ur: NEGATIVE mg/dL
Specific Gravity, Urine: 1.027 (ref 1.005–1.030)
pH: 6 (ref 5.0–8.0)

## 2018-09-21 LAB — CBC
HCT: 36.3 % (ref 36.0–46.0)
Hemoglobin: 11.7 g/dL — ABNORMAL LOW (ref 12.0–15.0)
MCH: 27 pg (ref 26.0–34.0)
MCHC: 32.2 g/dL (ref 30.0–36.0)
MCV: 83.6 fL (ref 80.0–100.0)
Platelets: 221 10*3/uL (ref 150–400)
RBC: 4.34 MIL/uL (ref 3.87–5.11)
RDW: 13.7 % (ref 11.5–15.5)
WBC: 6.5 10*3/uL (ref 4.0–10.5)
nRBC: 0 % (ref 0.0–0.2)

## 2018-09-21 LAB — HCG, QUANTITATIVE, PREGNANCY: hCG, Beta Chain, Quant, S: 40588 m[IU]/mL — ABNORMAL HIGH (ref <5)

## 2018-09-21 LAB — WET PREP, GENITAL
Sperm: NONE SEEN
Trich, Wet Prep: NONE SEEN
Yeast Wet Prep HPF POC: NONE SEEN

## 2018-09-21 LAB — POC URINE PREG, ED: Preg Test, Ur: POSITIVE — AB

## 2018-09-21 MED ORDER — PROMETHAZINE HCL 25 MG/ML IJ SOLN
25.0000 mg | Freq: Once | INTRAMUSCULAR | Status: AC
  Administered 2018-09-21: 11:00:00 25 mg via INTRAMUSCULAR
  Filled 2018-09-21: qty 1

## 2018-09-21 MED ORDER — LABETALOL HCL 200 MG PO TABS: 200.0000 mg | ORAL_TABLET | Freq: Two times a day (BID) | ORAL | 0 refills | Status: DC

## 2018-09-21 MED ORDER — METRONIDAZOLE 500 MG PO TABS: 500.0000 mg | ORAL_TABLET | Freq: Two times a day (BID) | ORAL | 0 refills | Status: DC

## 2018-09-21 MED ORDER — METRONIDAZOLE 0.75 % VA GEL: 1.0000 | Freq: Two times a day (BID) | VAGINAL | 0 refills | Status: DC

## 2018-09-21 MED ORDER — METOCLOPRAMIDE HCL 10 MG PO TABS: 10.0000 mg | ORAL_TABLET | Freq: Three times a day (TID) | ORAL | 0 refills | Status: DC | PRN

## 2018-09-21 NOTE — Discharge Instructions (Signed)
[facility]iders   Center for [facility]       Phone: [phone]  Center for [facility] [place] Phone: [phone]  Center for [facility] [place]  Phone: [phone]  Center for [facility]  Phone: [phone]  Center for [facility] [place]  Phone: [phone]  [facility]       Phone: [phone]  [facility]    Phone: [phone]   [facility]    Phone: [phone]  [facility]    Phone: [phone]  [facility]    Phone: [phone]   [facility]  Phone: [phone]  [facility]    Phone: [phone]  [facility]   Phone: [phone]  [facility]    Phone: [phone]     Bacterial Vaginosis  Bacterial vaginosis is a vaginal infection that occurs when the normal balance of bacteria in the vagina is disrupted. It results from an overgrowth of certain bacteria. This is the most common vaginal infection among women ages 4615-44. Because bacterial vaginosis increases your risk for STIs (sexually transmitted infections), getting treated can help reduce your risk for chlamydia, gonorrhea, herpes, and HIV (human immunodeficiency virus). Treatment is also important for preventing complications in pregnant women, because this condition can cause an early (premature) delivery. What are the causes? This condition is caused by an increase in harmful bacteria that are normally present in small amounts in the vagina. However, the reason that the condition develops is not fully understood. What increases the risk? The following factors may make you more likely to develop this condition:  Having a new sexual partner or multiple sexual partners.  Having  unprotected sex.  Douching.  Having an intrauterine device (IUD).  Smoking.  Drug and alcohol abuse.  Taking certain antibiotic medicines.  Being pregnant. You cannot get bacterial vaginosis from toilet seats, bedding, swimming pools, or contact with objects around you. What are the signs or symptoms? Symptoms of this condition include:  Grey or white vaginal discharge. The discharge can also be watery or foamy.  A fish-like odor with discharge, especially after sexual intercourse or during menstruation.  Itching in and around the vagina.  Burning or pain with urination. Some women with bacterial vaginosis have no signs or symptoms. How is this diagnosed? This condition is diagnosed based on:  Your medical history.  A physical exam of the vagina.  Testing a sample of vaginal fluid under a microscope to look for a large amount of bad bacteria or abnormal cells. Your health care provider may use a cotton swab or a small wooden spatula to collect the sample. How is this treated? This condition is treated with antibiotics. These may be given as a pill, a vaginal cream, or a medicine that is put into the vagina (suppository). If the condition comes back after treatment, a second round of antibiotics may be needed. Follow these instructions at home: Medicines  Take over-the-counter and prescription medicines only as told by your health care provider.  Take or use your antibiotic as told by your health care provider. Do not stop taking or using the antibiotic even if you start to feel better. General instructions  If you have a female sexual partner, tell her that you have a vaginal infection. She should see her health care provider and be treated if she has symptoms.  If you have a female sexual partner, he does not need treatment.  During treatment: ? Avoid sexual activity until you finish treatment. ? Do not douche. ? Avoid alcohol as directed by your health care  provider. ? Avoid breastfeeding as directed by your health care provider.  Drink enough water and fluids to keep your urine clear or pale yellow.  Keep the area around your vagina and rectum clean. ? Wash the area daily with warm water. ? Wipe yourself from front to back after using the toilet.  Keep all follow-up visits as told by your health care provider. This is important. How is this prevented?  Do not douche.  Wash the outside of your vagina with warm water only.  Use protection when having sex. This includes latex condoms and dental dams.  Limit how many sexual partners you have. To help prevent bacterial vaginosis, it is best to have sex with just one partner (monogamous).  Make sure you and your sexual partner are tested for STIs.  Wear cotton or cotton-lined underwear.  Avoid wearing tight pants and pantyhose, especially during summer.  Limit the amount of alcohol that you drink.  Do not use any products that contain nicotine or tobacco, such as cigarettes and e-cigarettes. If you need help quitting, ask your health care provider.  Do not use illegal drugs. Where to find more information  Centers for Disease Control and Prevention: SolutionApps.co.za  American Sexual Health Association (ASHA): www.ashastd.org  U.S. Department of Health and Health and safety inspector, Office on Women's Health: ConventionalMedicines.si or http://www.anderson-williamson.info/ Contact a health care provider if:  Your symptoms do not improve, even after treatment.  You have more discharge or pain when urinating.  You have a fever.  You have pain in your abdomen.  You have pain during sex.  You have vaginal bleeding between periods. Summary  Bacterial vaginosis is a vaginal infection that occurs when the normal balance of bacteria in the vagina is disrupted.  Because bacterial vaginosis increases your risk for STIs (sexually transmitted infections), getting treated can  help reduce your risk for chlamydia, gonorrhea, herpes, and HIV (human immunodeficiency virus). Treatment is also important for preventing complications in pregnant women, because the condition can cause an early (premature) delivery.  This condition is treated with antibiotic medicines. These may be given as a pill, a vaginal cream, or a medicine that is put into the vagina (suppository). This information is not intended to replace advice given to you by your health care provider. Make sure you discuss any questions you have with your health care provider. Document Released: 04/04/2005 Document Revised: 08/08/2016 Document Reviewed: 12/19/2015 Elsevier Interactive Patient Education  2019 Elsevier Inc.     Vaginal Yeast infection, Adult  Vaginal yeast infection is a condition that causes vaginal discharge as well as soreness, swelling, and redness (inflammation) of the vagina. This is a common condition. Some women get this infection frequently. What are the causes? This condition is caused by a change in the normal balance of the yeast (candida) and bacteria that live in the vagina. This change causes an overgrowth of yeast, which causes the inflammation. What increases the risk? The condition is more likely to develop in women who:  Take antibiotic medicines.  Have diabetes.  Take birth control pills.  Are pregnant.  Douche often.  Have a weak body defense system (immune system).  Have been taking steroid medicines for a long time.  Frequently wear tight clothing. What are the signs or symptoms? Symptoms of  this condition include:  White, thick, creamy vaginal discharge.  Swelling, itching, redness, and irritation of the vagina. The lips of the vagina (vulva) may be affected as well.  Pain or a burning feeling while urinating.  Pain during sex. How is this diagnosed? This condition is diagnosed based on:  Your medical history.  A physical exam.  A pelvic exam.  Your health care provider will examine a sample of your vaginal discharge under a microscope. Your health care provider may send this sample for testing to confirm the diagnosis. How is this treated? This condition is treated with medicine. Medicines may be over-the-counter or prescription. You may be told to use one or more of the following:  Medicine that is taken by mouth (orally).  Medicine that is applied as a cream (topically).  Medicine that is inserted directly into the vagina (suppository). Follow these instructions at home:  Lifestyle  Do not have sex until your health care provider approves. Tell your sex partner that you have a yeast infection. That person should go to his or her health care provider and ask if they should also be treated.  Do not wear tight clothes, such as pantyhose or tight pants.  Wear breathable cotton underwear. General instructions  Take or apply over-the-counter and prescription medicines only as told by your health care provider.  Eat more yogurt. This may help to keep your yeast infection from returning.  Do not use tampons until your health care provider approves.  Try taking a sitz bath to help with discomfort. This is a warm water bath that is taken while you are sitting down. The water should only come up to your hips and should cover your buttocks. Do this 3-4 times per day or as told by your health care provider.  Do not douche.  If you have diabetes, keep your blood sugar levels under control.  Keep all follow-up visits as told by your health care provider. This is important. Contact a health care provider if:  You have a fever.  Your symptoms go away and then return.  Your symptoms do not get better with treatment.  Your symptoms get worse.  You have new symptoms.  You develop blisters in or around your vagina.  You have blood coming from your vagina and it is not your menstrual period.  You develop pain in your  abdomen. Summary  Vaginal yeast infection is a condition that causes discharge as well as soreness, swelling, and redness (inflammation) of the vagina.  This condition is treated with medicine. Medicines may be over-the-counter or prescription.  Take or apply over-the-counter and prescription medicines only as told by your health care provider.  Do not douche. Do not have sex or use tampons until your health care provider approves.  Contact a health care provider if your symptoms do not get better with treatment or your symptoms go away and then return. This information is not intended to replace advice given to you by your health care provider. Make sure you discuss any questions you have with your health care provider. Document Released: 01/12/2005 Document Revised: 08/21/2017 Document Reviewed: 08/21/2017 Elsevier Interactive Patient Education  2019 Elsevier Inc.    Morning Sickness  Morning sickness is when a woman feels nauseous during pregnancy. This nauseous feeling may or may not come with vomiting. It often occurs in the morning, but it can be a problem at any time of day. Morning sickness is most common during the first trimester. In some cases,  it may continue throughout pregnancy. Although morning sickness is unpleasant, it is usually harmless unless the woman develops severe and continual vomiting (hyperemesis gravidarum), a condition that requires more intense treatment. What are the causes? The exact cause of this condition is not known, but it seems to be related to normal hormonal changes that occur in pregnancy. What increases the risk? You are more likely to develop this condition if:  You experienced nausea or vomiting before your pregnancy.  You had morning sickness during a previous pregnancy.  You are pregnant with more than one baby, such as twins. What are the signs or symptoms? Symptoms of this condition include:  Nausea.  Vomiting. How is this  diagnosed? This condition is usually diagnosed based on your signs and symptoms. How is this treated? In many cases, treatment is not needed for this condition. Making some changes to what you eat may help to control symptoms. Your health care provider may also prescribe or recommend:  Vitamin B6 supplements.  Anti-nausea medicines.  Ginger. Follow these instructions at home: Medicines  Take over-the-counter and prescription medicines only as told by your health care provider. Do not use any prescription, over-the-counter, or herbal medicines for morning sickness without first talking with your health care provider.  Taking multivitamins before getting pregnant can prevent or decrease the severity of morning sickness in most women. Eating and drinking  Eat a piece of dry toast or crackers before getting out of bed in the morning.  Eat 5 or 6 small meals a day.  Eat dry and bland foods, such as rice or a baked potato. Foods that are high in carbohydrates are often helpful.  Avoid greasy, fatty, and spicy foods.  Have someone cook for you if the smell of any food causes nausea and vomiting.  If you feel nauseous after taking prenatal vitamins, take the vitamins at night or with a snack.  Snack on protein foods between meals if you are hungry. Nuts, yogurt, and cheese are good options.  Drink fluids throughout the day.  Try ginger ale made with real ginger, ginger tea made from fresh grated ginger, or ginger candies. General instructions  Do not use any products that contain nicotine or tobacco, such as cigarettes and e-cigarettes. If you need help quitting, ask your health care provider.  Get an air purifier to keep the air in your house free of odors.  Get plenty of fresh air.  Try to avoid odors that trigger your nausea.  Consider trying these methods to help relieve symptoms: ? Wearing an acupressure wristband. These wristbands are often worn for  seasickness. ? Acupuncture. Contact a health care provider if:  Your home remedies are not working and you need medicine.  You feel dizzy or light-headed.  You are losing weight. Get help right away if:  You have persistent and uncontrolled nausea and vomiting.  You faint.  You have severe pain in your abdomen. Summary  Morning sickness is when a woman feels nauseous during pregnancy. This nauseous feeling may or may not come with vomiting.  Morning sickness is most common during the first trimester.  It often occurs in the morning, but it can be a problem at any time of day.  In many cases, treatment is not needed for this condition. Making some changes to what you eat may help to control symptoms. This information is not intended to replace advice given to you by your health care provider. Make sure you discuss any questions you have  with your health care provider. Document Released: 05/26/2006 Document Revised: 05/07/2016 Document Reviewed: 05/07/2016 Elsevier Interactive Patient Education  2019 [employer].

## 2018-09-21 NOTE — MAU Provider Note (Signed)
Chief Complaint: Abdominal Cramping and Emesis   First Provider Initiated Contact with Patient 09/21/18 1046     SUBJECTIVE HPI: Caroline Ingram is a 33 y.o. [id number] at [redacted]w[redacted]d who presents to Maternity Admissions reporting n/v & abdominal pain. Has not had a period since sometime in April. Reports normal cycles prior to then. Has not taken a pregnancy test at home.  Reports daily n/v for the last month. States she has trouble keeping everything down including fluids. Has not tried any interventions.  Lower abdominal pain for the last 2 days. RLQ pain that is intermittent and cramp like. Also has some thick white discharge & vaginal itching. States she is prone to getting yeast infections.  Denies fever/chills, diarrhea, constipation, dysuria, or vaginal bleeding.   Location: abdomen Quality: cramping Severity: 3/10 on pain scale Duration: 2 days Timing: intermittent Modifying factors: none Associated signs and symptoms: n/v & vaginal discharge  Past Medical History:  Diagnosis Date  . ADHD   . Asthma   . Depression    OB History  Gravida Para Term Preterm AB Living  3 2 0 2   2  SAB TAB Ectopic Multiple Live Births          2    # Outcome Date GA Lbr Len/2nd Weight Sex Delivery Anes PTL Lv  3 Current           2 Preterm 2008 [redacted]w[redacted]d    Vag-Spont   LIV  1 Preterm 2007 [redacted]w[redacted]d    Vag-Spont   LIV   Past Surgical History:  Procedure Laterality Date  . EYE SURGERY     Social History   Socioeconomic History  . Marital status: Single    Spouse name: Not on file  . Number of children: Not on file  . Years of education: Not on file  . Highest education level: Not on file  Occupational History  . Not on file  Social Needs  . Financial resource strain: Not on file  . Food insecurity:    Worry: Not on file    Inability: Not on file  . Transportation needs:    Medical: Not on file    Non-medical: Not on file  Tobacco Use  . Smoking status: Current Every Day Smoker     Packs/day: 1.00    Types: Cigarettes  . Smokeless tobacco: Never Used  Substance and Sexual Activity  . Alcohol use: Not Currently    Frequency: Never  . Drug use: Not Currently    Types: Marijuana  . Sexual activity: Yes    Birth control/protection: None  Lifestyle  . Physical activity:    Days per week: Not on file    Minutes per session: Not on file  . Stress: Not on file  Relationships  . Social connections:    Talks on phone: Not on file    Gets together: Not on file    Attends religious service: Not on file    Active member of club or organization: Not on file    Attends meetings of clubs or organizations: Not on file    Relationship status: Not on file  . Intimate partner violence:    Fear of current or ex partner: Not on file    Emotionally abused: Not on file    Physically abused: Not on file    Forced sexual activity: Not on file  Other Topics Concern  . Not on file  Social History Narrative  . Not on file  History reviewed. No pertinent family history. No current facility-administered medications on file prior to encounter.    Current Outpatient Medications on File Prior to Encounter  Medication Sig Dispense Refill  . PROAIR HFA 108 (90 Base) MCG/ACT inhaler INHALE 2 PUFFS BY MOUTH 4 (FOUR) TIMES A DAY  3  . amLODipine (NORVASC) 5 MG tablet Take 1 tablet (5 mg total) by mouth daily. 30 tablet 0  . amphetamine-dextroamphetamine (ADDERALL) 20 MG tablet Take 20 mg by mouth daily.  0  . ARIPiprazole (ABILIFY) 10 MG tablet Take 1 tablet (10 mg total) by mouth every morning. 15 tablet 0  . ARIPiprazole ER (ABILIFY MAINTENA) 400 MG SRER injection Inject 2 mLs (400 mg total) into the muscle every 28 (twenty-eight) days. 1 each 0  . chlorpheniramine (CHLOR-TRIMETON) 2 MG/5ML syrup Take 5 mLs (2 mg total) by mouth every 4 (four) hours as needed for allergies. 118 mL 0  . DULERA 100-5 MCG/ACT AERO Inhale 2 puffs into the lungs 2 (two) times daily.  3  . FLUoxetine  (PROZAC) 40 MG capsule Take 1 capsule (40 mg total) by mouth daily. 30 capsule 0  . hydrOXYzine (ATARAX/VISTARIL) 25 MG tablet Take 1 tablet (25 mg total) by mouth 3 (three) times daily as needed for anxiety. 30 tablet 0  . ondansetron (ZOFRAN ODT) 4 MG disintegrating tablet Take 1 tablet (4 mg total) by mouth every 8 (eight) hours as needed for nausea or vomiting. 20 tablet 0  . traZODone (DESYREL) 100 MG tablet Take 1 tablet (100 mg total) by mouth at bedtime. 30 tablet 0   Allergies  Allergen Reactions  . Chocolate   . Shrimp [Shellfish Allergy]     I have reviewed patient's Past Medical Hx, Surgical Hx, Family Hx, Social Hx, medications and allergies.   Review of Systems  Constitutional: Negative.   Gastrointestinal: Positive for abdominal pain, nausea and vomiting. Negative for constipation and diarrhea.  Genitourinary: Positive for vaginal discharge. Negative for dysuria and vaginal bleeding.    OBJECTIVE Patient Vitals for the past 24 hrs:  BP Temp Pulse Resp Height Weight  09/21/18 1314 131/75 - 63 18 - -  09/21/18 1022 (!) 148/78 98.8 F (37.1 C) 63 18 5\' 4"  (1.626 m) 88.5 kg   Constitutional: Well-developed, well-nourished female in no acute distress.  Cardiovascular: normal rate & rhythm, no murmur Respiratory: normal rate and effort. Lung sounds clear throughout GI: Abd soft, non-tender, Pos BS x 4. No guarding or rebound tenderness MS: Extremities nontender, no edema, normal ROM Neurologic: Alert and oriented x 4.  GU:     SPECULUM EXAM: NEFG, small amount of clumpy white discharge. Cervix pink/smooth     LAB RESULTS Results for orders placed or performed during the hospital encounter of 09/21/18 (from the past 24 hour(s))  POC Urine Pregnancy, ED (not at [facility])     Status: Abnormal   Collection Time: 09/21/18  9:25 AM  Result Value Ref Range   Preg Test, Ur POSITIVE (A) NEGATIVE  Urinalysis, Routine w reflex microscopic     Status: Abnormal   Collection Time:  09/21/18 10:47 AM  Result Value Ref Range   Color, Urine YELLOW YELLOW   APPearance HAZY (A) CLEAR   Specific Gravity, Urine 1.027 1.005 - 1.030   pH 6.0 5.0 - 8.0   Glucose, UA NEGATIVE NEGATIVE mg/dL   Hgb urine dipstick NEGATIVE NEGATIVE   Bilirubin Urine NEGATIVE NEGATIVE   Ketones, ur NEGATIVE NEGATIVE mg/dL   Protein, ur NEGATIVE NEGATIVE  mg/dL   Nitrite NEGATIVE NEGATIVE   Leukocytes,Ua NEGATIVE NEGATIVE  Wet prep, genital     Status: Abnormal   Collection Time: 09/21/18 11:00 AM  Result Value Ref Range   Yeast Wet Prep HPF POC NONE SEEN NONE SEEN   Trich, Wet Prep NONE SEEN NONE SEEN   Clue Cells Wet Prep HPF POC PRESENT (A) NONE SEEN   WBC, Wet Prep HPF POC MANY (A) NONE SEEN   Sperm NONE SEEN   hCG, quantitative, pregnancy     Status: Abnormal   Collection Time: 09/21/18 11:04 AM  Result Value Ref Range   hCG, Beta Chain, Quant, S 40,588 (H) <5 mIU/mL  CBC     Status: Abnormal   Collection Time: 09/21/18 12:30 PM  Result Value Ref Range   WBC 6.5 4.0 - 10.5 K/uL   RBC 4.34 3.87 - 5.11 MIL/uL   Hemoglobin 11.7 (L) 12.0 - 15.0 g/dL   HCT 16.1 09.6 - 04.5 %   MCV 83.6 80.0 - 100.0 fL   MCH 27.0 26.0 - 34.0 pg   MCHC 32.2 30.0 - 36.0 g/dL   RDW 40.9 81.1 - 91.4 %   Platelets 221 150 - 400 K/uL   nRBC 0.0 0.0 - 0.2 %    IMAGING US Ob Less Than 14 Weeks With Ob Transvaginal  Result Date: 09/21/2018 CLINICAL DATA:  Right-sided pain. EXAM: OBSTETRIC <14 WK [place] AND TRANSVAGINAL OB US TECHNIQUE: Both transabdominal and transvaginal ultrasound examinations were performed for complete evaluation of the gestation as well as the maternal uterus, adnexal regions, and pelvic cul-de-sac. Transvaginal technique was performed to assess early pregnancy. COMPARISON:  No recent prior. FINDINGS: Intrauterine gestational sac: Single Yolk sac:  Present Embryo:  Present Cardiac Activity: Present Heart Rate: 133 bpm CRL: 7.6 mm   6 w   for d                  [place] EDC: 05/13/2019 Subchorionic  hemorrhage:  None visualized. Maternal uterus/adnexae: Tiny right ovarian cyst noted most likely corpus luteal cyst. IMPRESSION: Single viable intrauterine pregnancy at 6 weeks 4 days. Electronically Signed   By: Maisie Fus  Register   On: 09/21/2018 12:19    MAU COURSE Orders Placed This Encounter  Procedures  . Wet prep, genital  . US OB LESS THAN 14 WEEKS WITH OB TRANSVAGINAL  . Urinalysis, Routine w reflex microscopic  . CBC  . hCG, quantitative, pregnancy  . POC Urine Pregnancy, ED (not at [facility])  . Discharge patient   Meds ordered this encounter  Medications  . promethazine (PHENERGAN) injection 25 mg  . metoCLOPramide (REGLAN) 10 MG tablet    Sig: Take 1 tablet (10 mg total) by mouth every 8 (eight) hours as needed for nausea.    Dispense:  30 tablet    Refill:  0    Order Specific Question:   Supervising Provider    Answer:   CONSTANT, PEGGY [[id number]]  . labetalol (NORMODYNE) 200 MG tablet    Sig: Take 1 tablet (200 mg total) by mouth 2 (two) times daily for 30 days.    Dispense:  60 tablet    Refill:  0    Order Specific Question:   Supervising Provider    Answer:   CONSTANT, PEGGY [[id number]]  . metroNIDAZOLE (FLAGYL) 500 MG tablet    Sig: Take 1 tablet (500 mg total) by mouth 2 (two) times daily.    Dispense:  14 tablet    Refill:  0  Order Specific Question:   Supervising Provider    Answer:   CONSTANT, PEGGY [[id number]]  . metroNIDAZOLE (METROGEL VAGINAL) 0.75 % vaginal gel    Sig: Place 1 Applicatorful vaginally 2 (two) times daily.    Dispense:  70 g    Refill:  0    Order Specific Question:   Supervising Provider    Answer:   CONSTANT, PEGGY [[id number]]    MDM +UPT UA, wet prep, GC/chlamydia, CBC, ABO/Rh, quant hCG, and US today to rule out ectopic pregnancy  Phenergan 25 mg IM for nausea- no vomiting in MAU. Will send home with rx reglan.   Ultrasound shows live IUP measuring 218w4d. EDD updated  Wet prep + clue cells. Will tx for BV. Will also tx for yeast based on exam &  pt's complaint.   Pt with chtn. Previously on meds but none currently. Elevated BP in MAU. Will start on labetalol  ASSESSMENT 1. Abdominal pain during pregnancy in first trimester   2. Normal IUP (intrauterine pregnancy) on prenatal ultrasound, first trimester   3. Nausea and vomiting during pregnancy prior to [redacted] weeks gestation   4. Vaginal yeast infection   5. Bacterial vaginosis   6. Chronic hypertension complicating or reason for care during pregnancy, first trimester     PLAN Discharge home in stable condition. Discussed reasons to return to MAU Start prenatal care Rx flagyl, terazol, reglan, & labetalol  Allergies as of 09/21/2018      Reactions   Chocolate    Shrimp [shellfish Allergy]       Medication List    STOP taking these medications   amLODipine 5 MG tablet Commonly known as:  NORVASC   amphetamine-dextroamphetamine 20 MG tablet Commonly known as:  ADDERALL   ARIPiprazole 10 MG tablet Commonly known as:  ABILIFY   ARIPiprazole ER 400 MG Srer injection Commonly known as:  ABILIFY MAINTENA   chlorpheniramine 2 MG/5ML syrup Commonly known as:  CHLOR-TRIMETON   Dulera 100-5 MCG/ACT Aero Generic drug:  mometasone-formoterol   FLUoxetine 40 MG capsule Commonly known as:  PROZAC   hydrOXYzine 25 MG tablet Commonly known as:  ATARAX/VISTARIL   ondansetron 4 MG disintegrating tablet Commonly known as:  Zofran ODT     TAKE these medications   labetalol 200 MG tablet Commonly known as:  NORMODYNE Take 1 tablet (200 mg total) by mouth 2 (two) times daily for 30 days.   metoCLOPramide 10 MG tablet Commonly known as:  REGLAN Take 1 tablet (10 mg total) by mouth every 8 (eight) hours as needed for nausea.   metroNIDAZOLE 0.75 % vaginal gel Commonly known as:  METROGEL VAGINAL Place 1 Applicatorful vaginally 2 (two) times daily.   metroNIDAZOLE 500 MG tablet Commonly known as:  FLAGYL Take 1 tablet (500 mg total) by mouth 2 (two) times daily.    ProAir HFA 108 (90 Base) MCG/ACT inhaler Generic drug:  albuterol INHALE 2 PUFFS BY MOUTH 4 (FOUR) TIMES A DAY   traZODone 100 MG tablet Commonly known as:  DESYREL Take 1 tablet (100 mg total) by mouth at bedtime.        Judeth HornLawrence, Taryll Reichenberger, NP 09/21/2018  1:24 PM

## 2018-09-21 NOTE — MAU Note (Signed)
Pt c/o N/V for about a month. Worse the last 2 weeks. Can't keep anything down. Also c/o abd cramping on the right side.

## 2018-09-21 NOTE — ED Triage Notes (Signed)
Pt in with abdominal cramping, n/v. States LMP in April, possibly pregnant

## 2018-09-24 LAB — GC/CHLAMYDIA PROBE AMP (~~LOC~~) NOT AT ARMC
Chlamydia: NEGATIVE
Neisseria Gonorrhea: NEGATIVE

## 2018-10-22 LAB — AMB REFERRAL TO OB-GYN
Drug Screen, Urine: NEGATIVE
Pap: NEGATIVE
Urine Culture, OB: NEGATIVE

## 2018-10-22 LAB — OB RESULTS CONSOLE ABO/RH: RH Type: POSITIVE

## 2018-10-22 LAB — OB RESULTS CONSOLE HIV ANTIBODY (ROUTINE TESTING): HIV: NONREACTIVE

## 2018-10-22 LAB — OB RESULTS CONSOLE GC/CHLAMYDIA
Chlamydia: NEGATIVE
Gonorrhea: NEGATIVE

## 2018-10-22 LAB — OB RESULTS CONSOLE VARICELLA ZOSTER ANTIBODY, IGG: Varicella: NON-IMMUNE/NOT IMMUNE

## 2018-10-22 LAB — OB RESULTS CONSOLE PLATELET COUNT: Platelets: 249

## 2018-10-22 LAB — OB RESULTS CONSOLE HGB/HCT, BLOOD
HCT: 34 (ref 29–41)
Hemoglobin: 10.8

## 2018-10-22 LAB — OB RESULTS CONSOLE ANTIBODY SCREEN: Antibody Screen: NEGATIVE

## 2018-10-22 LAB — OB RESULTS CONSOLE RUBELLA ANTIBODY, IGM: Rubella: IMMUNE

## 2018-10-22 LAB — OB RESULTS CONSOLE HEPATITIS B SURFACE ANTIGEN: Hepatitis B Surface Ag: NEGATIVE

## 2018-10-22 LAB — OB RESULTS CONSOLE RPR: RPR: NONREACTIVE

## 2018-10-24 ENCOUNTER — Other Ambulatory Visit (HOSPITAL_COMMUNITY): Payer: Self-pay | Admitting: Family

## 2018-10-24 DIAGNOSIS — Z369 Encounter for antenatal screening, unspecified: Secondary | ICD-10-CM

## 2018-11-02 ENCOUNTER — Encounter (HOSPITAL_COMMUNITY): Payer: Self-pay

## 2018-11-05 ENCOUNTER — Inpatient Hospital Stay (HOSPITAL_COMMUNITY): Payer: Medicaid Other

## 2018-11-05 ENCOUNTER — Ambulatory Visit (HOSPITAL_COMMUNITY): Payer: Medicaid Other

## 2018-11-05 ENCOUNTER — Inpatient Hospital Stay (HOSPITAL_COMMUNITY)
Admission: AD | Admit: 2018-11-05 | Discharge: 2018-11-05 | Disposition: A | Discharge status: Home / Self Care | Payer: Medicaid Other | Attending: Obstetrics & Gynecology | Admitting: Obstetrics & Gynecology

## 2018-11-05 ENCOUNTER — Encounter (HOSPITAL_COMMUNITY): Payer: Self-pay

## 2018-11-05 ENCOUNTER — Ambulatory Visit (INDEPENDENT_AMBULATORY_CARE_PROVIDER_SITE_OTHER): Payer: Medicaid Other | Admitting: Obstetrics and Gynecology

## 2018-11-05 ENCOUNTER — Other Ambulatory Visit: Payer: Self-pay

## 2018-11-05 DIAGNOSIS — O99511 Diseases of the respiratory system complicating pregnancy, first trimester: Secondary | ICD-10-CM | POA: Insufficient documentation

## 2018-11-05 DIAGNOSIS — Z91018 Allergy to other foods: Secondary | ICD-10-CM | POA: Insufficient documentation

## 2018-11-05 DIAGNOSIS — O26891 Other specified pregnancy related conditions, first trimester: Secondary | ICD-10-CM | POA: Diagnosis not present

## 2018-11-05 DIAGNOSIS — O99611 Diseases of the digestive system complicating pregnancy, first trimester: Secondary | ICD-10-CM | POA: Insufficient documentation

## 2018-11-05 DIAGNOSIS — O9989 Other specified diseases and conditions complicating pregnancy, childbirth and the puerperium: Secondary | ICD-10-CM | POA: Diagnosis not present

## 2018-11-05 DIAGNOSIS — O99011 Anemia complicating pregnancy, first trimester: Secondary | ICD-10-CM

## 2018-11-05 DIAGNOSIS — R079 Chest pain, unspecified: Secondary | ICD-10-CM | POA: Insufficient documentation

## 2018-11-05 DIAGNOSIS — Z3A13 13 weeks gestation of pregnancy: Secondary | ICD-10-CM

## 2018-11-05 DIAGNOSIS — O10911 Unspecified pre-existing hypertension complicating pregnancy, first trimester: Secondary | ICD-10-CM

## 2018-11-05 DIAGNOSIS — F17210 Nicotine dependence, cigarettes, uncomplicated: Secondary | ICD-10-CM | POA: Insufficient documentation

## 2018-11-05 DIAGNOSIS — O209 Hemorrhage in early pregnancy, unspecified: Secondary | ICD-10-CM | POA: Diagnosis not present

## 2018-11-05 DIAGNOSIS — Z658 Other specified problems related to psychosocial circumstances: Secondary | ICD-10-CM

## 2018-11-05 DIAGNOSIS — R12 Heartburn: Secondary | ICD-10-CM | POA: Diagnosis not present

## 2018-11-05 DIAGNOSIS — Z720 Tobacco use: Secondary | ICD-10-CM

## 2018-11-05 DIAGNOSIS — O99321 Drug use complicating pregnancy, first trimester: Secondary | ICD-10-CM

## 2018-11-05 DIAGNOSIS — Z79899 Other long term (current) drug therapy: Secondary | ICD-10-CM | POA: Insufficient documentation

## 2018-11-05 DIAGNOSIS — O161 Unspecified maternal hypertension, first trimester: Secondary | ICD-10-CM | POA: Insufficient documentation

## 2018-11-05 DIAGNOSIS — O99019 Anemia complicating pregnancy, unspecified trimester: Secondary | ICD-10-CM

## 2018-11-05 DIAGNOSIS — O99331 Smoking (tobacco) complicating pregnancy, first trimester: Secondary | ICD-10-CM | POA: Diagnosis not present

## 2018-11-05 DIAGNOSIS — F17200 Nicotine dependence, unspecified, uncomplicated: Secondary | ICD-10-CM | POA: Insufficient documentation

## 2018-11-05 DIAGNOSIS — F1210 Cannabis abuse, uncomplicated: Secondary | ICD-10-CM

## 2018-11-05 DIAGNOSIS — N939 Abnormal uterine and vaginal bleeding, unspecified: Secondary | ICD-10-CM

## 2018-11-05 DIAGNOSIS — Z975 Presence of (intrauterine) contraceptive device: Secondary | ICD-10-CM

## 2018-11-05 DIAGNOSIS — O0991 Supervision of high risk pregnancy, unspecified, first trimester: Secondary | ICD-10-CM

## 2018-11-05 DIAGNOSIS — J45909 Unspecified asthma, uncomplicated: Secondary | ICD-10-CM | POA: Insufficient documentation

## 2018-11-05 DIAGNOSIS — O10919 Unspecified pre-existing hypertension complicating pregnancy, unspecified trimester: Secondary | ICD-10-CM | POA: Insufficient documentation

## 2018-11-05 DIAGNOSIS — Z91013 Allergy to seafood: Secondary | ICD-10-CM | POA: Insufficient documentation

## 2018-11-05 DIAGNOSIS — R109 Unspecified abdominal pain: Secondary | ICD-10-CM | POA: Diagnosis not present

## 2018-11-05 DIAGNOSIS — O09899 Supervision of other high risk pregnancies, unspecified trimester: Secondary | ICD-10-CM

## 2018-11-05 DIAGNOSIS — O0990 Supervision of high risk pregnancy, unspecified, unspecified trimester: Secondary | ICD-10-CM

## 2018-11-05 DIAGNOSIS — O09211 Supervision of pregnancy with history of pre-term labor, first trimester: Secondary | ICD-10-CM

## 2018-11-05 HISTORY — DX: Post-traumatic stress disorder, unspecified: F43.10

## 2018-11-05 LAB — URINALYSIS, ROUTINE W REFLEX MICROSCOPIC
Bilirubin Urine: NEGATIVE
Glucose, UA: NEGATIVE mg/dL
Hgb urine dipstick: NEGATIVE
Ketones, ur: NEGATIVE mg/dL
Leukocytes,Ua: NEGATIVE
Nitrite: NEGATIVE
Protein, ur: NEGATIVE mg/dL
Specific Gravity, Urine: 1.03 (ref 1.005–1.030)
pH: 6 (ref 5.0–8.0)

## 2018-11-05 LAB — WET PREP, GENITAL
Clue Cells Wet Prep HPF POC: NONE SEEN
Sperm: NONE SEEN
Trich, Wet Prep: NONE SEEN
Yeast Wet Prep HPF POC: NONE SEEN

## 2018-11-05 MED ORDER — ALUM & MAG HYDROXIDE-SIMETH 200-200-20 MG/5ML PO SUSP
30.0000 mL | Freq: Once | ORAL | Status: AC
  Administered 2018-11-05: 30 mL via ORAL
  Filled 2018-11-05: qty 30

## 2018-11-05 MED ORDER — FAMOTIDINE 20 MG PO TABS: 20.0000 mg | ORAL_TABLET | Freq: Two times a day (BID) | ORAL | 1 refills | Status: DC

## 2018-11-05 MED ORDER — POLYSACCHARIDE IRON COMPLEX 150 MG PO CAPS: 150.0000 mg | ORAL_CAPSULE | Freq: Every day | ORAL | 4 refills | Status: DC

## 2018-11-05 MED ORDER — ASPIRIN EC 81 MG PO TBEC: 81.0000 mg | DELAYED_RELEASE_TABLET | Freq: Every day | ORAL | 3 refills | Status: DC

## 2018-11-05 MED ORDER — DOCUSATE SODIUM 100 MG PO CAPS: 100.0000 mg | ORAL_CAPSULE | Freq: Two times a day (BID) | ORAL | 2 refills | Status: DC | PRN

## 2018-11-05 MED ORDER — AMLODIPINE BESYLATE 5 MG PO TABS: 5.0000 mg | ORAL_TABLET | Freq: Every day | ORAL | 3 refills | Status: DC

## 2018-11-05 NOTE — Discharge Instructions (Signed)
Heartburn During Pregnancy ° °Heartburn is pain or discomfort in the throat or chest. It may cause a burning feeling. It happens when stomach acid moves up into the tube that carries food from your mouth to your stomach (esophagus). Heartburn is common during pregnancy. It usually goes away or gets better after giving birth. °Follow these instructions at home: °Eating and drinking °· Do not drink alcohol while you are pregnant. °· Figure out which foods and beverages make you feel worse, and avoid them. °· Beverages that you may want to avoid include: °? Coffee and tea (with or without caffeine). °? Energy drinks and sports drinks. °? Bubbly (carbonated) drinks or sodas. °? Citrus fruit juices. °· Foods that you may want to avoid include: °? Chocolate and cocoa. °? Peppermint and mint flavorings. °? Garlic, onions, and horseradish. °? Spicy and acidic foods. These include peppers, chili powder, curry powder, vinegar, hot sauces, and barbecue sauce. °? Citrus fruits, such as oranges, lemons, and limes. °? Tomato-based foods, such as red sauce, chili, and salsa. °? Fried and fatty foods, such as donuts, french fries, potato chips, and high-fat dressings. °? High-fat meats, such as hot dogs, cold cuts, sausage, ham, and bacon. °? High-fat dairy items, such as whole milk, butter, and cheese. °· Eat small meals often, instead of large meals. °· Avoid drinking a lot of liquid with your meals. °· Avoid eating meals during the 2-3 hours before you go to bed. °· Avoid lying down right after you eat. °· Do not exercise right after you eat. °Medicines °· Take over-the-counter and prescription medicines only as told by your doctor. °· Do not take aspirin, ibuprofen, or other NSAIDs unless your doctor tells you to do that. °· Your doctor may tell you to avoid medicines that have sodium bicarbonate in them. °General instructions ° °· If told, raise the head of your bed about 6 inches (15 cm). You can do this by putting blocks  under the legs. Sleeping with more pillows does not help with heartburn. °· Do not use any products that contain nicotine or tobacco, such as cigarettes and e-cigarettes. If you need help quitting, ask your doctor. °· Wear loose-fitting clothing. °· Try to lower your stress, such as with yoga or meditation. If you need help, ask your doctor. °· Stay at a healthy weight. If you are overweight, work with your doctor to safely lose weight. °· Keep all follow-up visits as told by your doctor. This is important. °Contact a doctor if: °· You get new symptoms. °· Your symptoms do not get better with treatment. °· You have weight loss and you do not know why. °· You have trouble swallowing. °· You make loud sounds when you breathe (wheeze). °· You have a cough that does not go away. °· You have heartburn often for more than 2 weeks. °· You feel sick to your stomach (nauseous), and this does not get better with treatment. °· You are throwing up (vomiting), and this does not get better with treatment. °· You have pain in your belly (abdomen). °Get help right away if: °· You have very bad chest pain that spreads to your arm, neck, or jaw. °· You feel sweaty, dizzy, or light-headed. °· You have trouble breathing. °· You have pain when swallowing. °· You throw up and your throw-up looks like blood or coffee grounds. °· Your poop (stool) is bloody or black. °This information is not intended to replace advice given to you by your health   care provider. Make sure you discuss any questions you have with your health care provider. °Document Released: 05/07/2010 Document Revised: 07/26/2018 Document Reviewed: 12/21/2015 °Elsevier Patient Education © 2020 [employer] ° ° °

## 2018-11-05 NOTE — MAU Note (Signed)
Caroline Ingram is a 33 y.o. at [redacted]w[redacted]d here in MAU reporting: pain in the middle of her chest, lower abdominal cramping, had spotting on Fri and Sat but none now.   Onset of complaint: last night Pain score:9 Vitals:   11/05/18 1034  BP: 122/77  Pulse: 76  Resp: 18  Temp: 98.2 F (36.8 C)     FHT:146 Lab orders placed from triage: UA

## 2018-11-05 NOTE — Progress Notes (Signed)
TELEHEALTH VIRTUAL OBSTETRICS VISIT ENCOUNTER NOTE  Clinic: [facility]  I connected with Caroline Ingram on 11/05/18 at  2:55 PM EDT by telephone at home and verified that I am speaking with the correct person using two identifiers.   I discussed the limitations, risks, security and privacy concerns of performing an evaluation and management service by telephone and the availability of in person appointments. I also discussed with the patient that there may be a patient responsible charge related to this service. The patient expressed understanding and agreed to proceed.  Transfer of care visit from [facility]  Subjective:  Caroline Ingram is a 33 y.o. [id number] at [redacted]w[redacted]d being followed for ongoing prenatal care.  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  Patient went to MAU this morning for spotting after sex. Neg wet prep. Gc/ct pending.  Reports fetal movement. Denies any contractions, bleeding or leaking of fluid.   The following portions of the patient's history were reviewed and updated as appropriate: allergies, current medications, past family history, past medical history, past social history, past surgical history and problem list.   Objective:  There were no vitals filed for this visit.  Babyscripts Data Reviewed: not applicable  General:  Alert, oriented and cooperative.   Mental Status: Normal mood and affect perceived. Normal judgment and thought content.  Rest of physical exam deferred due to type of encounter  Assessment and Plan:  Pregnancy: [id number] at [redacted]w[redacted]d 1.  Psychosocial stressors - Amb ref to [place]  2. Supervision of high risk pregnancy, antepartum Routine care. [facility] labs, notes reviewed. Offer afp nv. Has NT u/s tomorrow. Prefers in person visits and recommend close following with Caroline Ingram.  3. History of preterm delivery, currently pregnant 2007: bedrest for several weeks per patient report and went into PTL at approx 36wks. No surgeries done 2008: PPROM approx 30m before EDC. Not on 17p  D/w her and she is amenable to 17p. Start at [place]  4. Nexplanon in place Placed in 2008. Will remove at next available appt  5. Chronic hypertension affecting pregnancy Will switch from labetalol to norvasc 5 which she was on in the beginning of pregnancy and had no issues with. Pt amenable to start low dose asa.  Preterm labor symptoms and general obstetric precautions including but not limited to vaginal bleeding, contractions, leaking of fluid and fetal movement were reviewed in detail with the patient.  I discussed the assessment and treatment plan with the patient. The patient was provided an opportunity to ask questions and all were answered. The patient agreed with the plan and demonstrated an understanding of the instructions. The patient was advised to call back or seek an in-person office evaluation/go to MAU at [facility] for any urgent or concerning symptoms. Please refer to After Visit Summary for other counseling recommendations.   I provided 15 minutes of non-face-to-face time during this encounter. The visit was conducted via MyChart video-medicine  Return in about 1 week (around 11/12/2018) for needs asap nexplanon removal visit. needs 3wk hrob, 17p and to see Caroline Ingram.  Future Appointments  Date Time Provider [place]  11/06/2018  2:45 PM [place] [facility]-US  11/06/2018  2:45 PM [facility]-M[facility] US 2 [facility]-MFCUS MFC-US  11/06/2018  3:45 PM [facility]-M[facility] LAB [facility]-M[facility] MFC-US    Conrath Bingharlie Antwonette Feliz, MD [facility]

## 2018-11-05 NOTE — MAU Provider Note (Signed)
[name] is a 33 y.o. [id number] At 3767w0d here with complaints of chest pain, cramping and  vaginal spotting. She denies dysuria, headache, SOB, fever, nausea or chills. She has a history of chest pain that is usually related to indigestion.   She has a history of asthma but has not had to use her inhaler in a "long time". She is a chronic hypertensive o History     CSN: [id number]  Arrival date and time: 11/05/18 1004   First Provider Initiated Contact with Patient 11/05/18 1102      Chief Complaint  Patient presents with  . Abdominal Pain  . Chest Pain   Abdominal Pain This is a new problem. The current episode started in the past 7 days. The problem occurs intermittently. The pain is located in the suprapubic region. The pain is at a severity of 9/10. The abdominal pain does not radiate. Pertinent negatives include no constipation, diarrhea, frequency, headaches, nausea or vomiting.  Vaginal Bleeding The patient's primary symptoms include vaginal bleeding. The patient's pertinent negatives include no genital itching, genital lesions or genital odor. This is a new problem. The current episode started yesterday. The problem occurs rarely. The problem has been resolved. Associated symptoms include abdominal pain. Pertinent negatives include no constipation, diarrhea, frequency, headaches, nausea or vomiting. The vaginal discharge was bloody. The vaginal bleeding is spotting.   Patient says that she has had intermittent "chest pain" over the weekend. It is located in her epigastric region; she thinks it is related to her indigestion. She took Tums and that helped a little bit. She was worried that she might be having a heart attack so when she had cramping and bleeding over the weekend she decided to come in and get checked out.     OB History    Gravida  3   Para  2   Term  0   Preterm  2   AB      Living  2     SAB      TAB      Ectopic      Multiple      Live Births  2           Past Medical History:  Diagnosis Date  . ADHD   . Asthma   . Depression   . Hypertension     Past Surgical History:  Procedure Laterality Date  . EYE SURGERY      History reviewed. No pertinent family history.  Social History   Tobacco Use  . Smoking status: Current Every Day Smoker    Packs/day: 1.00    Types: Cigarettes  . Smokeless tobacco: Never Used  Substance Use Topics  . Alcohol use: Not Currently    Frequency: Never  . Drug use: Not Currently    Types: Marijuana    Allergies:  Allergies  Allergen Reactions  . Chocolate   . Shrimp [Shellfish Allergy]     Medications Prior to Admission  Medication Sig Dispense Refill Last Dose  . labetalol (NORMODYNE) 200 MG tablet Take 1 tablet (200 mg total) by mouth 2 (two) times daily for 30 days. 60 tablet 0   . metoCLOPramide (REGLAN) 10 MG tablet Take 1 tablet (10 mg total) by mouth every 8 (eight) hours as needed for nausea. 30 tablet 0  at not taking  . metroNIDAZOLE (FLAGYL) 500 MG tablet Take 1 tablet (500 mg total) by mouth 2 (two) times daily. 14 tablet 0  at not taking  . metroNIDAZOLE (METROGEL VAGINAL) 0.75 % vaginal gel Place 1 Applicatorful vaginally 2 (two) times daily. 70 g 0  at not taking  . PROAIR HFA 108 (90 Base) MCG/ACT inhaler INHALE 2 PUFFS BY MOUTH 4 (FOUR) TIMES A DAY  3   . traZODone (DESYREL) 100 MG tablet Take 1 tablet (100 mg total) by mouth at bedtime. 30 tablet 0  at not taking    Review of Systems  Gastrointestinal: Positive for abdominal pain. Negative for constipation, diarrhea, nausea and vomiting.  Genitourinary: Positive for vaginal bleeding. Negative for frequency.  Neurological: Negative for headaches.   Physical Exam   Blood pressure 122/77, pulse 76, temperature 98.2 F (36.8 C), resp. rate 18, last menstrual period 07/11/2018.  Physical Exam  Constitutional: She appears well-developed.  HENT:  Head: Normocephalic.  Eyes: Pupils are equal,  round, and reactive to light.  Neck: Normal range of motion.  Respiratory: Effort normal.  GI: Soft.  Genitourinary:    Genitourinary Comments: NEFG; no blood in the vagina. Present milky white discharge; no CMT, suprapubic or adnexal tenderness. Cervix is closed, long, thick.    Musculoskeletal: Normal range of motion.  Neurological: She is alert.  Skin: Skin is warm and dry.    MAU Course  Procedures  MDM -EKG: normal sinus rythmn.  -wet prep normal -gc pending -US shows SIUP with FHR of 135, no [place] identified.  -Patient's chest pain is relieved with GI cocktail.  -Patient has not been taking her labetalol 200 mg because of how it makes her feel. She will address this at her appt today. She will also address her fatigue at her appt today.  Patient Vitals for the past 24 hrs:  BP Temp Pulse Resp  11/05/18 1034 122/77 98.2 F (36.8 C) 76 18    Assessment and Plan   1. Heart burn   2. Vaginal bleeding    2. Patient safe for discharge; reviewed pelvic rest. Plan to keep ob appt today and discuss starting iron and changing her BP meds as she is does not like the side effects of labetalol.   3. Patient to take pepcid twice a day for heartburn; instructions on managing heartburn given.   Maye Hides  Mervyn Skeeters Jerome Otter 11/05/2018, 1:04 PM

## 2018-11-05 NOTE — Addendum Note (Signed)
Addended by: Aletha Halim on: 11/05/2018 03:51 PM   Modules accepted: Orders

## 2018-11-06 ENCOUNTER — Ambulatory Visit (HOSPITAL_COMMUNITY)
Admission: RE | Admit: 2018-11-06 | Discharge: 2018-11-06 | Disposition: A | Discharge status: Home / Self Care | Payer: Medicaid Other | Source: Ambulatory Visit | Attending: Obstetrics and Gynecology | Admitting: Obstetrics and Gynecology

## 2018-11-06 ENCOUNTER — Other Ambulatory Visit: Payer: Self-pay

## 2018-11-06 ENCOUNTER — Ambulatory Visit (HOSPITAL_COMMUNITY): Payer: Medicaid Other | Admitting: *Deleted

## 2018-11-06 ENCOUNTER — Ambulatory Visit (HOSPITAL_COMMUNITY): Payer: Medicaid Other

## 2018-11-06 ENCOUNTER — Encounter: Payer: Self-pay | Admitting: Emergency Medicine

## 2018-11-06 DIAGNOSIS — O10011 Pre-existing essential hypertension complicating pregnancy, first trimester: Secondary | ICD-10-CM | POA: Insufficient documentation

## 2018-11-06 DIAGNOSIS — O0991 Supervision of high risk pregnancy, unspecified, first trimester: Secondary | ICD-10-CM | POA: Diagnosis not present

## 2018-11-06 DIAGNOSIS — Z3682 Encounter for antenatal screening for nuchal translucency: Secondary | ICD-10-CM | POA: Insufficient documentation

## 2018-11-06 DIAGNOSIS — O09291 Supervision of pregnancy with other poor reproductive or obstetric history, first trimester: Secondary | ICD-10-CM | POA: Diagnosis not present

## 2018-11-06 DIAGNOSIS — O09211 Supervision of pregnancy with history of pre-term labor, first trimester: Secondary | ICD-10-CM | POA: Diagnosis not present

## 2018-11-06 DIAGNOSIS — O99511 Diseases of the respiratory system complicating pregnancy, first trimester: Secondary | ICD-10-CM | POA: Insufficient documentation

## 2018-11-06 DIAGNOSIS — O9989 Other specified diseases and conditions complicating pregnancy, childbirth and the puerperium: Secondary | ICD-10-CM

## 2018-11-06 DIAGNOSIS — J45909 Unspecified asthma, uncomplicated: Secondary | ICD-10-CM | POA: Diagnosis not present

## 2018-11-06 DIAGNOSIS — Z79899 Other long term (current) drug therapy: Secondary | ICD-10-CM | POA: Diagnosis not present

## 2018-11-06 DIAGNOSIS — Z369 Encounter for antenatal screening, unspecified: Secondary | ICD-10-CM

## 2018-11-06 DIAGNOSIS — Z3A13 13 weeks gestation of pregnancy: Secondary | ICD-10-CM | POA: Diagnosis not present

## 2018-11-06 DIAGNOSIS — O0990 Supervision of high risk pregnancy, unspecified, unspecified trimester: Secondary | ICD-10-CM

## 2018-11-06 LAB — GC/CHLAMYDIA PROBE AMP (~~LOC~~) NOT AT ARMC
Chlamydia: NEGATIVE
Neisseria Gonorrhea: NEGATIVE

## 2018-11-06 NOTE — Progress Notes (Signed)
Called [facility] in [place], [place] and placed the prescription order for 17-p. Verified all information with Estill Bamberg.

## 2018-11-07 ENCOUNTER — Telehealth: Payer: Self-pay | Admitting: Obstetrics & Gynecology

## 2018-11-07 ENCOUNTER — Other Ambulatory Visit (HOSPITAL_COMMUNITY): Payer: Self-pay | Admitting: *Deleted

## 2018-11-07 DIAGNOSIS — O10912 Unspecified pre-existing hypertension complicating pregnancy, second trimester: Secondary | ICD-10-CM

## 2018-11-07 NOTE — Telephone Encounter (Signed)
Called the patient to complete the pre-screen. Left a detailed voicemail of wearing a face mask, sanitizing hands at the sanitizing station upon entering our office, and no visitors or children are allowed due to the COVID19 restrictions. Also informed the patient if she is experiencing any flu like symptoms please call our office to reschedule. °

## 2018-11-08 ENCOUNTER — Other Ambulatory Visit: Payer: Self-pay

## 2018-11-08 ENCOUNTER — Ambulatory Visit (INDEPENDENT_AMBULATORY_CARE_PROVIDER_SITE_OTHER): Payer: Medicaid Other | Admitting: Obstetrics and Gynecology

## 2018-11-08 ENCOUNTER — Telehealth: Payer: Self-pay | Admitting: Lactation Services

## 2018-11-08 VITALS — BP 130/61 | HR 66 | Temp 98.5°F | Wt 197.3 lb

## 2018-11-08 DIAGNOSIS — Z308 Encounter for other contraceptive management: Secondary | ICD-10-CM | POA: Diagnosis not present

## 2018-11-08 DIAGNOSIS — Z975 Presence of (intrauterine) contraceptive device: Secondary | ICD-10-CM | POA: Diagnosis present

## 2018-11-08 LAB — FIRST TRIMESTER SCREEN W/NT
CRL: 69.9 mm
DIA MoM: 1.04
DIA Value: 190.1 pg/mL
Gest Age-Collect: 13 weeks
Maternal Age At EDD: 32.6 yr
Nuchal Translucency MoM: 1.35
Nuchal Translucency: 2.1 mm
Number of Fetuses: 1
PAPP-A MoM: 0.65
PAPP-A Value: 589.4 ng/mL
Test Results:: NEGATIVE
Weight: 196 [lb_av]
hCG MoM: 0.7
hCG Value: 50.3 IU/mL

## 2018-11-08 NOTE — Telephone Encounter (Signed)
Caroline Ingram at [facility] called and left message on Nurse voice mail to let us know that Caroline Ingram's Makena was delivered to their office.   Called Caroline Ingram back and LM to inform her that this is our patient and we will need to send someone over tomorrow to pick that up.

## 2018-11-08 NOTE — Progress Notes (Signed)
Patient presents today for nexplanon removal per Dr. Ilda Basset. The patient states her nexplanon has been in place for 11 years. She has not been able to locate the nexplanon in her arm for 6 years. She states she has gained weight. She is now [redacted] weeks pregnant and here for removal.  Nexplanon not located. Incision scar noted. Dr. Hulan Fray attempted to assess for location.  Patient instructed to f/u following delivery for [place] and general surgery consult.  Lezlie Lye, NP 11/08/2018 3:19 PM

## 2018-11-13 ENCOUNTER — Telehealth (HOSPITAL_COMMUNITY): Payer: Self-pay | Admitting: Genetic Counselor

## 2018-11-13 NOTE — Telephone Encounter (Signed)
I called Ms. Megna to discuss her negative first trimester screen results. We reviewed that the risk for her pregnancy to be affected by Down syndrome decreased from her 1 in 479 age-related risk to 1 in 3900, and the risk for trisomy 18 decreased from her 1 in 84 age-related risk to 1 in 6600 based on the results of this screen. Ms. Berrones was reminded that while this screen significantly reduces the likelihood of the pregnancy being affected by trisomy 38 or trisomy 41, it cannot be considered diagnostic. Diagnostic testing via [facility] or amniocentesis is available should she be interested in pursuing this. We also reviewed that first trimester screening does not screen for open neural tube defects such as spina bifida, so her doctor should order AFP screening around 16-18 weeks to screen for this. Ms. Reinertsen confirmed that she had no questions.  Buelah Manis, MS Genetic Counselor

## 2018-11-16 ENCOUNTER — Encounter: Payer: Self-pay | Admitting: *Deleted

## 2018-11-16 ENCOUNTER — Telehealth: Payer: Self-pay | Admitting: *Deleted

## 2018-11-16 NOTE — Telephone Encounter (Signed)
Pt left VM requesting a call back regarding her medications. I called pt @ 1040. She stated that the medications which were prescribed on 7/20 by Dr. Ilda Basset are not covered by [facility] and are going to cost $33 which she cannot afford. Also, her psychiatrist Dr. Patriciaann Clan has prescribed some additional medications and asked her to check with our office to confirm they are safe for her to take during pregnancy. They are Latuda, hydroxyzine and Minipres. I told pt that I will discuss with Dr. Ilda Basset and also call her pharmacy to find out about her co-pays for all the prescriptions. I will send a MyChart message to her with all the information. Pt voiced understanding and agreed to check her MyChart portal later today for my message. Per Dr. Ilda Basset, pt may take lake all of the medications prescribed by Dr. Gilberto Better and we will hold off on Amlodipine for now until the effects of Minipres on her BP can be evaluated. I called the [facility] and was informed that all of the prescriptions are covered by pt's Medicaid and she will have $3 co-pay for each medication. MyChart message sent to pt.

## 2018-11-21 ENCOUNTER — Telehealth: Payer: Self-pay | Admitting: Advanced Practice Midwife

## 2018-11-21 NOTE — Telephone Encounter (Signed)
The patient called in stating she has an appointment with her psychiatric doctor on the same day as her doctor's appointment with [place]. She stated she tried to reschedule the other doctor's appointment however they don't have another available slot until September. Changed the appointment dates on our end as the patient has to have a 17p injections.

## 2018-11-26 ENCOUNTER — Encounter: Payer: Medicaid Other | Admitting: Obstetrics & Gynecology

## 2018-11-28 ENCOUNTER — Other Ambulatory Visit: Payer: Self-pay

## 2018-11-28 ENCOUNTER — Ambulatory Visit: Payer: Medicaid Other | Admitting: Clinical

## 2018-11-28 ENCOUNTER — Ambulatory Visit (INDEPENDENT_AMBULATORY_CARE_PROVIDER_SITE_OTHER): Payer: Medicaid Other | Admitting: Obstetrics and Gynecology

## 2018-11-28 VITALS — BP 120/76 | HR 69 | Wt 198.5 lb

## 2018-11-28 DIAGNOSIS — Z3A16 16 weeks gestation of pregnancy: Secondary | ICD-10-CM | POA: Diagnosis not present

## 2018-11-28 DIAGNOSIS — O09212 Supervision of pregnancy with history of pre-term labor, second trimester: Secondary | ICD-10-CM

## 2018-11-28 DIAGNOSIS — O0992 Supervision of high risk pregnancy, unspecified, second trimester: Secondary | ICD-10-CM

## 2018-11-28 DIAGNOSIS — O99019 Anemia complicating pregnancy, unspecified trimester: Secondary | ICD-10-CM

## 2018-11-28 DIAGNOSIS — O10919 Unspecified pre-existing hypertension complicating pregnancy, unspecified trimester: Secondary | ICD-10-CM

## 2018-11-28 DIAGNOSIS — Z975 Presence of (intrauterine) contraceptive device: Secondary | ICD-10-CM

## 2018-11-28 DIAGNOSIS — O09899 Supervision of other high risk pregnancies, unspecified trimester: Secondary | ICD-10-CM

## 2018-11-28 DIAGNOSIS — O10912 Unspecified pre-existing hypertension complicating pregnancy, second trimester: Secondary | ICD-10-CM

## 2018-11-28 DIAGNOSIS — O99012 Anemia complicating pregnancy, second trimester: Secondary | ICD-10-CM

## 2018-11-28 DIAGNOSIS — O0990 Supervision of high risk pregnancy, unspecified, unspecified trimester: Secondary | ICD-10-CM

## 2018-11-28 MED ORDER — HYDROXYPROGESTERONE CAPROATE 275 MG/1.1ML ~~LOC~~ SOAJ
275.0000 mg | SUBCUTANEOUS | Status: AC
  Administered 2018-11-28 – 2018-12-18 (×3): 275 mg via SUBCUTANEOUS

## 2018-11-28 NOTE — Patient Instructions (Signed)
AREA PEDIATRIC/FAMILY PRACTICE PHYSICIANS  [place] ([address]) . [facility] o Chambliss, MD; Eniola, MD; Hale, MD; Hensel, MD; McDiarmid, MD; McIntyer, MD; Neal, MD; Walden, MD o [address] [place], [place] [address] o [phone] o Mon-Fri 8:30-12:30, 1:30-5:00 o Providers come to see babies at [facility] o Accepting Medicaid . [facility] o Limited providers who accept newborns: Koirala, MD; Morrow, MD; Wolters, MD o [address], [place], [place] [address] o [phone] o Mon-Fri 8:00-5:30 o Babies seen by providers at [facility] o Does NOT accept Medicaid o Please call early in hospitalization for appointment (limited availability)  . [facility] o Mulberry, MD o [address] [place], [place] [address] o [phone] o Mon, Tue, Thur, Fri 8:30-5:00, Wed 10:00-7:00 (closed 1-2pm) o Babies seen by [facility] providers o Accepting Medicaid . [facility]in - Pediatrician o Rubin, MD o [address], [place], [place] [address] o [phone] o Mon-Fri 8:30-5:00, Sat 8:30-12:00 o Provider comes to see babies at [facility] o Accepting Medicaid o Must have been referred from current patients or contacted office prior to delivery . [facility] ([facility]) o Brown, MD; Chandler, MD; Ettefagh, MD; Grant, MD; Lester, MD; McCormick, MD; McQueen, MD; Prose, MD; Simha, MD; Stanley, MD; Stryffeler, NP; Tebben, NP o [address], [place], [place] [address] o [phone] o Mon, Tue, Thur, Fri 8:30-5:30, Wed 9:30-5:30, Sat 8:30-12:30 o Babies seen by [facility] providers o Accepting Medicaid o Only accepting infants of first-time parents or siblings of current patients o Hospital discharge coordinator will make follow-up appointment . Jack Amos o [address],  [place], [place]  [address] o [phone]   Fax - [phone] . [facility] o [address], [place], [place]  [address] o Phone - [phone]   Fax - [phone] . Shilpa Gosrani o [address], [place], [place]  [address] o [phone]  [place]heast [place] ([address]) . [facility] o Bates, MD; Brassfield, MD; Cooper, Cox, MD; MD; Davis, MD; Dovico, MD; Ettefaugh, MD; Little, MD; Lowe, MD; Keiffer, MD; Melvin, MD; Sumner, MD; Williams, MD o [address], [place], [place] [address] o [phone] o Mon-Fri 8:30-5:00 (extended evenings Mon-Thur as needed), Sat-Sun 10:00-1:00 o Providers come to see babies at [facility] o Accepting Medicaid for families of first-time babies and families with all children in the household age 3 and under. Must register with office prior to making appointment (M-F only). . [facility] o Henson, NP; Knapp, MD; Lalonde, MD; Tysinger, PA o [address] [place], [place] [address] o [phone] o Mon-Fri 8:00-5:00 o Babies seen by providers at [facility] o Does NOT accept Medicaid/Commercial Insurance Only . [facility] ([facility])  o Artis, MD; Barnes, MD; Bratton, MD; Coccaro, MD; Lockett Gardner, MD; Kramer, MD; Marshall, MD; Netherton, MD; Poleto, MD; Skinner, MD o [address] [place], [place] [address] o [phone] o Mon-Fri 8:30-5:30, Sat (Oct.-Mar.) 9:00-1:00 o Babies seen by providers at [facility] o Accepting Medicaid  [place] ([address]) . [facility] o Reid, MD; Warner, MD o [address], [place],  [address] o [phone] o Mon-Fri 8:30-5:00, Sat 8:30-12:00 o Providers come to see babies at [facility] o Does NOT accept Medicaid . [facility] o Becker, PA; Hagler, MD; Scifres, PA; Sun, MD; Swayne, MD o [address],  [place], [place] [address] o [phone] o Mon-Fri 8:00-5:00 o Babies seen by providers at [facility] o Does NOT accept Medicaid o Only accepting babies of parents who are patients o Please call early in hospitalization for appointment (limited availability) . [facility] o Clark, MD; Frye, MD; Kelleher, MD; Mack, NP; Miller, MD; O'Keller, MD; Patterson, NP; Pudlo, MD; Puzio, MD; Thomas, MD; Tucker, MD; Twiselton, MD o [address], [place], [place] [address] o [phone] o Mon-Fri 8:00-5:00, Sat 9:00-12:00 o Providers come to see babies at [facility] o Does NOT accept Medicaid  [place] ([address]) . [facility] o Limited providers accepting new patients: Brake, NP; Wharton, PA o [address], [place], [place] [address] o [phone] o Mon-Fri 8:00-5:00 o Babies seen by providers at [facility] o Does NOT accept Medicaid o Only accepting babies of parents who are patients o Please call early in hospitalization for appointment (limited availability) . [facility] o Gay, MD; Quinlan, MD o [address] [place], [place] [address] o [phone] (press 1 to schedule appointment) o Mon-Fri 8:00-5:00 o Providers come to see babies at [facility] o Does NOT accept Medicaid . [facility] o Mazer, MD o [address] [place], [place] [address] o [phone] o Mon-Fri 8:30-5:00 (lunch 12:30-1:00), extended hours by appointment only Wed 5:00-6:30 o Babies seen by [facility] providers o Accepting Medicaid . [facility] o Banks, MD; Jordan, MD; Koberlein, MD o [address], [place], [place] [address] o [phone] o Mon-Fri 8:00-5:00 o Babies seen by [facility] providers o Does NOT accept Medicaid . [facility] o Parker, MD; Hunter, MD; Wallace, DO o [address] [place], [place]  [address] o [phone] o Mon-Fri 8:00-5:00 o Babies seen by [facility] providers o Does NOT accept Medicaid . [facility] o Brandon, PA; Brecken, PA; Christy, NP; Dees, MD; DeClaire, MD; DeWeese, MD; Hansen, NP; Mills, NP; Parrish, NP; Smoot, NP; Summer, MD; Vapne, MD o [address] [place], [place] [address] o [phone] o Mon-Fri 8:30-5:00, Sat 10:00-1:00 o Providers come to see babies at [facility] o Does NOT accept Medicaid o Free prenatal information session Tuesdays at 4:45pm . [facility] o Bouska, MD; Gordon, PA; Jeffery, PA; Weber, PA o [address] [place] [address] o [phone] o Mon-Fri 7:30-5:30 o Babies seen by [facility] providers . [facility] o [address], [place], [place], [place]  [address] o [phone]   Fax - [phone]  [place] ([phone]) . [facility] o Reese, MD o [address] [place], [place] [address] o [phone] o Mon-Thur 8:00-6:00 o Providers come to see babies at [facility] o Accepting Medicaid . [facility] o Anderson, NP; Badger, MD; Beal, PA; Spencer, PA o [address] [place],  [address] o [phone] o Mon-Thur 7:30-7:30, Fri 7:30-4:30 o Babies seen by [facility] providers o Accepting Medicaid . [facility] o Agbuya, MD; Klett, NP; Romgoolam, MD o [address], ,  [address] o [phone] o Mon-Fri 8:30-5:00, Sat 8:30-12:00 o Providers come to see babies at [facility] o Accepting Medicaid o Must have "Meet & Greet" appointment at office prior to delivery . [facility]  ([facility] ) o McCord,   MD; Juleen China, MD; Clydene Laming, [address], [place], [place] [address] o [phone] o Mon-Wed 8:00-6:00, Thur-Fri 8:00-5:00, Sat 9:00-12:00 o Providers come to  see babies at [facility] o Does NOT accept Medicaid o Only accepting siblings of current patients . [facility] [place], [place], [place], [place]  [address] o [phone]   Fax [phone] . [facility] [address], [place], [place]  [address] o [phone]   Fax - Morton Gorman [phone] & [phone]) . Therapist, music at [place], DO; [place], [address] [place], [place] [address] o [phone] o Mon-Fri 7:00-5:00 o Babies seen by [facility] providers o Does NOT accept Medicaid . [place], MD; [place], [place]; [place], [place], [place], [place] [address] o [phone] o Mon-Fri 8:00-5:00 o Babies seen by [facility] providers o Accepting Medicaid . Lamont, MD; [place], [place]; [place], NP; [place], [place], [place], [place] [address] o [phone] o Mon-Fri 8:00-5:00 o Babies seen by providers at [place] [phone]) . [facility] [place], [place] o [address] [place], [place] [address] o [phone] o Mon-Fri 8:00-5:00 o Babies seen by [facility] providers o Does NOT accept Medicaid o Limited availability, please call early in hospitalization to schedule follow-up . [facility] Leilani Merl, PA; Maisie Fus, MD; [place], MD; [place], [place]; Jeannine Kitten, MD; [place], [place] [facility] [address], [place], [place] [address] o [phone] o Mon-Fri 8:30-5:00, Sat 9:00-12:00 o Babies seen by providers at [facility] o Accepting Medicaid o Please register online then schedule online or call office o [email or web address] . [place] ([facility]  [place]) Kristian Covey, NP; Dwyane Dee, MD; Leonidas Romberg, PA o [address] [place], [place], [place] [address] o [phone] o Mon-Fri 8:00-5:00 o Babies seen by providers at [facility] o Accepting Medicaid . [place] ([facility] [employer]) Dairl Ponder, MD; Rayvon Char, NP; Melina Modena, MD o [address] [place], [place], [place] [address] o [phone] o Mon-Fri 8:00-5:30, Sat&Sun by appointment (phones open at 8:30) o Babies seen by [facility] providers o Accepting Medicaid o Must be a first-time baby or sibling of current patient . [place], [address], [place], [place]  [address] o [phone]   Fax - [phone]  [place] [phone] & [phone]) . [place], [place]; [place], [place]; Benjamine Mola, MD; [place], [place]; Harrell Lark, MD o [address] [place], [place] [address] o [phone] o Mon-Thur 8:00-7:00, Fri 8:00-5:00, Sat 8:00-12:00, Sun 9:00-12:00 o Babies seen by [facility] providers o Accepting Medicaid . [facility], MD; Ruthann Cancer, MD; [facility], MD o [address], [place], [place] [address] o [phone] o Mon-Thur 8:00-5:00 o Babies seen by providers at [facility] o Accepting Medicaid . [facility] [place], MD; Coe-Goins, MD; Amedeo Plenty, MD; Bobby Rumpf, MD; List, MD; Lavonia Drafts, MD; Ruthann Cancer, MD; Selinda Eon, MD; Audie Box, MD; Jim Like, MD; Christie Nottingham, MD; Hubbard Hartshorn, MD; Modena Nunnery, MD o [address] [place], [place]  [id number] o [phone] o Mon-Fri 8:00-5:30, Sat (Oct.-Mar.) 9:00-1:00 o Babies seen by providers at [facility] o Accepting Medicaid o Must fill out new patient packet, available online at [email or web address] . [facility] [address] [facility]) Simone Curiao Friddle, NP; Tiburcio PeaHarris, NP; Tresa EndoKelly, NP; Whitney PostLogan, MD;  [place], [place]; Hennie DuosPoth, MD; Wynne Dustamadoss, MD; Kavin LeechStanton, NP o [address], [place], [place] [address] o [phone] o Mon-Thur 8:00-5:30, Fri 8:00-5:00 o Babies seen by providers at [facility] o Accepting [facility]  [place] [phone]([address]) . [facility] o [address], [place]; [place], MD; Tanya NonesPickard, MD; Kendrick[place]ia, [place] o [address], [place], [place] [address] o [phone] o Mon-Fri 8:00-5:00 o Babies seen by providers at [facility] o Accepting [facility]   [place] [phone]) . [facility] o MarathonMasneri, DO; Lenise ArenaMeyers, MD; [place], [place] o [address], [place], [place] [address] o [phone] o Mon-Fri 8:00-5:00 o Babies seen by providers at [facility] o Does NOT accept Medicaid o Limited appointment availability, please call early in hospitalization  . Nature conservation officerLeBauer [facility] at [facility] o MorseKunedd, DO; KasilofMcGowen, MD o [address], [place], [place] [address] o [phone] o Mon-Fri 8:00-5:00 o Babies seen by [facility] providers o Does NOT accept Medicaid . [facility] Lorrine Kino Cameron, MD; Ninetta LightsMacDonald, MD; [place], [place]; ThurmontNayak, MD o [address], [place], [place] [address] o [phone] o Mon-Fri 8:00-5:00 o After hours clinic [facility]([address] [place], [place] [address]) [phone] Mon-Fri 5:00-8:00, Sat 12:00-6:00, Sun 10:00-4:00 o Babies seen by [facility] providers o Accepting Medicaid . [facility] o [address], [place], [place]  [address] o [phone]   Fax - [phone]  [place] [phone]) . Nature conservation officerLeBauer HealthCare at [facility] o Andy, MD o [address] [place], [place], [place] [address] o [phone] o Mon-Fri 8:00-5:00 o Babies seen by [facility] providers o Does NOT accept Medicaid . [facility] [place] [facility] [place]) Tomi Likenso Eksir, MD o [address], [place], [place]  [address] o [phone] o Mon-Thur 8:00-7:00, Fri 8:00-5:00, Sat 8:00-12:00 o Babies seen by providers at [facility] o Accepting Medicaid - but does not have vaccinations in office (must be received elsewhere) o Limited availability, please call early in hospitalization  [place] ([address]) . [facility]  o Wyvonne Lenzharlene Flemming, MD o [address], [place] [address] o [phone]  Fax [phone]  BENEFITS OF BREASTFEEDING Many women wonder if they should breastfeed. Research shows that breast milk contains the perfect balance of vitamins, protein and fat that your baby needs to grow. It also contains antibodies that help your baby's immune system to fight off viruses and bacteria and can reduce the risk of sudden infant death syndrome (SIDS). In addition, the colostrum (a fluid secreted from the breast in the first few days after delivery) helps your newborn's digestive system to grow and function well. Breast milk is easier to digest than formula. Also, if your baby is born preterm, breast milk can help to reduce both short- and long-term health problems. BENEFITS OF BREASTFEEDING FOR MOM . Breastfeeding causes a hormone to be released that helps the uterus to contract and return to its normal size more quickly. . It aids in postpartum weight loss, reduces risk of breast and ovarian cancer, heart disease and rheumatoid arthritis. . It decreases the amount  of bleeding after the baby is born. benefits of breastfeeding for baby . Provides comfort and nutrition . Protects baby against - Obesity - Diabetes - Asthma - Childhood cancers - Heart disease - Ear infections - Diarrhea - Pneumonia - Stomach problems - Serious allergies - Skin rashes . Promotes growth and development . Reduces the risk of baby having Sudden Infant Death Syndrome (SIDS) only breastmilk for the first 6 months . Protects baby against diseases/allergies . It's the perfect amount for tiny  bellies . It restores baby's energy . Provides the best nutrition for baby . Giving water or formula can make baby more likely to get sick, decrease Mom's milk supply, make baby less content with breastfeeding Skin to Skin After delivery, the staff will place your baby on your chest. This helps with the following: . Regulates baby's temperature, breathing, heart rate and blood sugar . Increases Mom's milk supply . Promotes bonding . Keeps baby and Mom calm and decreases baby's crying Rooming In Your baby will stay in your room with you for the entire time you are in the hospital. This helps with the following: . Allows Mom to learn baby's feeding cues - Fluttering eyes - Sucking on tongue or hand - Rooting (opens mouth and turns head) - Nuzzling into the breast - Bringing hand to mouth . Allows breastfeeding on demand (when your baby is ready) . Helps baby to be calm and content . Ensures a good milk supply . Prevents complications with breastfeeding . Allows parents to learn to care for baby . Allows you to request assistance with breastfeeding Importance of a good latch . Increases milk transfer to baby - baby gets enough milk . Ensures you have enough milk for your baby . Decreases nipple soreness . Don't use pacifiers and bottles - these cause baby to suck differently than breastfeeding . Promotes continuation of breastfeeding Risks of Formula Supplementation with Breastfeeding Giving your infant formula in addition to your breast-milk EXCEPT when medically necessary can lead to: [place]. Decreases your milk supply  . Loss of confidence in yourself for providing baby's nutrition  . Engorgement and possibly mastitis  . Asthma & allergies in the baby BREASTFEEDING FAQS How long should I breastfeed my baby? It is recommended that you provide your baby with breast milk only for the first 6 months and then continue for the first year and longer as desired. During the first few weeks  after birth, your baby will need to feed 8-12 times every 24 hours, or every 2-3 hours. They will likely feed for 15-30 minutes. How can I help my baby begin breastfeeding? Babies are born with an instinct to breastfeed. A healthy baby can begin breastfeeding right away without specific help. At the hospital, a nurse (or lactation consultant) will help you begin the process and will give you tips on good positioning. It may be helpful to take a breastfeeding class before you deliver in order to know what to expect. How can I help my baby latch on? In order to assist your baby in latching-on, cup your breast in your hand and stroke your baby's lower lip with your nipple to stimulate your baby's rooting reflex. Your baby will look like he or she is yawning, at which point you should bring the baby towards your breast, while aiming the nipple at the roof of his or her mouth. Remember to bring the baby towards you and not your breast towards the baby. How can I tell if my  baby is latched-on? Your baby will have all of your nipple and part of the dark area around the nipple in his or her mouth and your baby's nose will be touching your breast. You should see or hear the baby swallowing. If the baby is not latched-on properly, start the process over. To remove the suction, insert a clean finger between your breast and the baby's mouth. Should I switch breasts during feeding? After feeding on one side, switch the baby to your other breast. If he or she does not continue feeding - that is OK. Your baby will not necessarily need to feed from both breasts in a single feeding. On the next feeding, start with the other breast for efficiency and comfort. How can I tell if my baby is hungry? When your baby is hungry, they will nuzzle against your breast, make sucking noises and tongue motions and may put their hands near their mouth. Crying is a late sign of hunger, so you should not wait until this point. When they  have received enough milk, they will unlatch from the breast. Is it okay to use a pacifier? Until your baby gets the hang of breastfeeding, experts recommend limiting pacifier usage. If you have questions about this, please contact your pediatrician. What can I do to ensure proper nutrition while breastfeeding? . Make sure that you support your own health and your baby's by eating a healthy, well-balanced diet . Your provider may recommend that you continue to take your prenatal vitamin . Drink plenty of fluids. It is a good rule to drink one glass of water before or after feeding . Alcohol will remain in the breast milk for as long as it will remain in the blood stream. If you choose to have a drink, it is recommended that you wait at least 2 hours before feeding . Moderate amounts of caffeine are OK . Some over-the-counter or prescription medications are not recommended during breastfeeding. Check with your provider if you have questions What types of birth control methods are safe while breastfeeding? Progestin-only methods, including a daily pill, an IUD, the implant and the injection are safe while breastfeeding. Methods that contain estrogen (such as combination birth control pills, the vaginal ring and the patch) should not be used during the first month of breastfeeding as these can decrease your milk supply.

## 2018-11-28 NOTE — Progress Notes (Signed)
   PRENATAL VISIT NOTE  Subjective:  Caroline Ingram is a 33 y.o. H4V4259 at [redacted]w[redacted]d being seen today for ongoing prenatal care.  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  Patient reports no complaints.  Contractions: Not present. Vag. Bleeding: None.   . Denies leaking of fluid.   The following portions of the patient's history were reviewed and updated as appropriate: allergies, current medications, past family history, past medical history, past social history, past surgical history and problem list.   Objective:   Vitals:   11/28/18 1454  BP: 120/76  Pulse: 69  Weight: 198 lb 8 oz (90 kg)    Fetal Status: Fetal Heart Rate (bpm): 138         General:  Alert, oriented and cooperative. Patient is in no acute distress.  Skin: Skin is warm and dry. No rash noted.   Cardiovascular: Normal heart rate noted  Respiratory: Normal respiratory effort, no problems with respiration noted  Abdomen: Soft, gravid, appropriate for gestational age.  Pain/Pressure: Absent     Pelvic: Cervical exam deferred        Extremities: Normal range of motion.  Edema: None  Mental Status: Normal mood and affect. Normal behavior. Normal judgment and thought content.   Assessment and Plan:  Pregnancy: [id number] at [redacted]w[redacted]d 1. Supervision of high risk pregnancy, antepartum Patient is doing well. Has not been taking Norvasc or ASA (see below) AFP drawn today  2. Chronic hypertension affecting pregnancy Continue ASA (has not yet started, will start when she gets  paid this week) Patient was given Minipress by psychiatrist and told to stop Norvasc. She went to pharmacy and Minipress was not there so she has not been taking either medication. She will f/u with psychiatrist about medication at pharmacy. BP cuff being sent to her. She is to call if BP > 140/90  3. History of preterm delivery, currently pregnant Received weekly 17-P today; patient prefers to come weekly for injections rather than administer at home   4. Nexplanon in place Unable to locate and will need to be remove postpartum with general surgery consultation  5. Antepartum anemia Continue prenatal vitamins Follow up third trimester labs  Preterm labor symptoms and general obstetric precautions including but not limited to vaginal bleeding, contractions, leaking of fluid and fetal movement were reviewed in detail with the patient. Please refer to After Visit Summary for other counseling recommendations.   Return in about 4 weeks (around 12/26/2018) for [place].  Future Appointments  Date Time Provider [place]  12/18/2018  2:30 PM [place] [facility]  12/18/2018  2:30 PM [facility] [place] [facility]    Chauncey Mann, MD

## 2018-11-28 NOTE — Addendum Note (Signed)
Addended by: Michel Harrow on: 11/28/2018 05:31 PM   Modules accepted: Orders

## 2018-11-28 NOTE — BH Specialist Note (Signed)
Pt declined visit, as she already has a therapist.   [place] Initial Visit  MRN: [id number]  Name: Caroline Ingram Atarah Cadogan, LCSW

## 2018-11-30 LAB — AFP, SERUM, OPEN SPINA BIFIDA
AFP MoM: 1.4
AFP Value: 43.2 ng/mL
Gest. Age on Collection Date: 16.2 weeks
Maternal Age At EDD: 32.5 yr
OSBR Risk 1 IN: 7201
Test Results:: NEGATIVE
Weight: 198 [lb_av]

## 2018-12-03 ENCOUNTER — Ambulatory Visit: Payer: Medicaid Other

## 2018-12-04 ENCOUNTER — Telehealth: Payer: Self-pay | Admitting: Family Medicine

## 2018-12-04 NOTE — Telephone Encounter (Signed)
Spoke to patient about her appointment on 8/19 @ 2:00. Patient instructed to wear a face mask for the entire appointment and no visitors are allowed with her during the visit. Patient screened for covid symptoms and denied having any

## 2018-12-05 ENCOUNTER — Other Ambulatory Visit: Payer: Self-pay

## 2018-12-05 ENCOUNTER — Telehealth: Payer: Medicaid Other

## 2018-12-05 ENCOUNTER — Ambulatory Visit (INDEPENDENT_AMBULATORY_CARE_PROVIDER_SITE_OTHER): Payer: Medicaid Other

## 2018-12-05 VITALS — BP 125/67 | HR 77 | Wt 197.3 lb

## 2018-12-05 DIAGNOSIS — Z3A17 17 weeks gestation of pregnancy: Secondary | ICD-10-CM | POA: Diagnosis not present

## 2018-12-05 DIAGNOSIS — O09212 Supervision of pregnancy with history of pre-term labor, second trimester: Secondary | ICD-10-CM | POA: Diagnosis not present

## 2018-12-05 DIAGNOSIS — O09899 Supervision of other high risk pregnancies, unspecified trimester: Secondary | ICD-10-CM

## 2018-12-05 DIAGNOSIS — O09219 Supervision of pregnancy with history of pre-term labor, unspecified trimester: Secondary | ICD-10-CM

## 2018-12-05 NOTE — Progress Notes (Signed)
[name] here for 17-P  Injection.  Injection administered without complication. Patient will return in one week for next injection.  Verdell Carmine, RN 12/05/2018  2:33 PM

## 2018-12-05 NOTE — Progress Notes (Signed)
Patient seen and assessed by nursing staff during this encounter. I have reviewed the chart and agree with the documentation and plan.  Kerry Hough, PA-C 12/05/2018 2:42 PM

## 2018-12-06 ENCOUNTER — Encounter: Payer: Self-pay | Admitting: *Deleted

## 2018-12-06 DIAGNOSIS — O0990 Supervision of high risk pregnancy, unspecified, unspecified trimester: Secondary | ICD-10-CM

## 2018-12-07 ENCOUNTER — Ambulatory Visit: Payer: Medicaid Other

## 2018-12-08 ENCOUNTER — Other Ambulatory Visit: Payer: Self-pay

## 2018-12-08 ENCOUNTER — Encounter (HOSPITAL_COMMUNITY): Payer: Self-pay | Admitting: *Deleted

## 2018-12-08 ENCOUNTER — Inpatient Hospital Stay (HOSPITAL_COMMUNITY)
Admission: AD | Admit: 2018-12-08 | Discharge: 2018-12-08 | Disposition: A | Discharge status: Home / Self Care | Payer: Medicaid Other | Attending: Obstetrics and Gynecology | Admitting: Obstetrics and Gynecology

## 2018-12-08 ENCOUNTER — Inpatient Hospital Stay (HOSPITAL_COMMUNITY): Payer: Medicaid Other

## 2018-12-08 DIAGNOSIS — K8020 Calculus of gallbladder without cholecystitis without obstruction: Secondary | ICD-10-CM | POA: Diagnosis not present

## 2018-12-08 DIAGNOSIS — O99332 Smoking (tobacco) complicating pregnancy, second trimester: Secondary | ICD-10-CM | POA: Insufficient documentation

## 2018-12-08 DIAGNOSIS — O9A212 Injury, poisoning and certain other consequences of external causes complicating pregnancy, second trimester: Secondary | ICD-10-CM | POA: Insufficient documentation

## 2018-12-08 DIAGNOSIS — Z79899 Other long term (current) drug therapy: Secondary | ICD-10-CM | POA: Diagnosis not present

## 2018-12-08 DIAGNOSIS — O26612 Liver and biliary tract disorders in pregnancy, second trimester: Secondary | ICD-10-CM | POA: Insufficient documentation

## 2018-12-08 DIAGNOSIS — R1010 Upper abdominal pain, unspecified: Secondary | ICD-10-CM

## 2018-12-08 DIAGNOSIS — O99342 Other mental disorders complicating pregnancy, second trimester: Secondary | ICD-10-CM | POA: Diagnosis not present

## 2018-12-08 DIAGNOSIS — D649 Anemia, unspecified: Secondary | ICD-10-CM | POA: Insufficient documentation

## 2018-12-08 DIAGNOSIS — O99012 Anemia complicating pregnancy, second trimester: Secondary | ICD-10-CM | POA: Diagnosis not present

## 2018-12-08 DIAGNOSIS — O26892 Other specified pregnancy related conditions, second trimester: Secondary | ICD-10-CM | POA: Diagnosis not present

## 2018-12-08 DIAGNOSIS — Z3A17 17 weeks gestation of pregnancy: Secondary | ICD-10-CM | POA: Insufficient documentation

## 2018-12-08 DIAGNOSIS — O99512 Diseases of the respiratory system complicating pregnancy, second trimester: Secondary | ICD-10-CM | POA: Insufficient documentation

## 2018-12-08 DIAGNOSIS — F329 Major depressive disorder, single episode, unspecified: Secondary | ICD-10-CM | POA: Diagnosis not present

## 2018-12-08 DIAGNOSIS — O0992 Supervision of high risk pregnancy, unspecified, second trimester: Secondary | ICD-10-CM | POA: Insufficient documentation

## 2018-12-08 DIAGNOSIS — Z915 Personal history of self-harm: Secondary | ICD-10-CM | POA: Diagnosis not present

## 2018-12-08 DIAGNOSIS — O0990 Supervision of high risk pregnancy, unspecified, unspecified trimester: Secondary | ICD-10-CM

## 2018-12-08 DIAGNOSIS — J45909 Unspecified asthma, uncomplicated: Secondary | ICD-10-CM | POA: Diagnosis not present

## 2018-12-08 DIAGNOSIS — Z91410 Personal history of adult physical and sexual abuse: Secondary | ICD-10-CM | POA: Diagnosis not present

## 2018-12-08 DIAGNOSIS — Y92410 Unspecified street and highway as the place of occurrence of the external cause: Secondary | ICD-10-CM | POA: Insufficient documentation

## 2018-12-08 DIAGNOSIS — F17210 Nicotine dependence, cigarettes, uncomplicated: Secondary | ICD-10-CM | POA: Insufficient documentation

## 2018-12-08 DIAGNOSIS — F909 Attention-deficit hyperactivity disorder, unspecified type: Secondary | ICD-10-CM | POA: Insufficient documentation

## 2018-12-08 DIAGNOSIS — O162 Unspecified maternal hypertension, second trimester: Secondary | ICD-10-CM | POA: Insufficient documentation

## 2018-12-08 DIAGNOSIS — K828 Other specified diseases of gallbladder: Secondary | ICD-10-CM

## 2018-12-08 LAB — CBC
HCT: 30.6 % — ABNORMAL LOW (ref 36.0–46.0)
Hemoglobin: 9.8 g/dL — ABNORMAL LOW (ref 12.0–15.0)
MCH: 27.2 pg (ref 26.0–34.0)
MCHC: 32 g/dL (ref 30.0–36.0)
MCV: 85 fL (ref 80.0–100.0)
Platelets: 235 10*3/uL (ref 150–400)
RBC: 3.6 MIL/uL — ABNORMAL LOW (ref 3.87–5.11)
RDW: 13.2 % (ref 11.5–15.5)
WBC: 8.8 10*3/uL (ref 4.0–10.5)
nRBC: 0 % (ref 0.0–0.2)

## 2018-12-08 MED ORDER — ACETAMINOPHEN 500 MG PO TABS
1000.0000 mg | ORAL_TABLET | Freq: Once | ORAL | Status: AC
  Administered 2018-12-08: 1000 mg via ORAL
  Filled 2018-12-08: qty 2

## 2018-12-08 NOTE — Discharge Instructions (Signed)
Cholelithiasis  Cholelithiasis is a form of gallbladder disease in which gallstones form in the gallbladder. The gallbladder is an organ that stores bile. Bile is made in the liver, and it helps to digest fats. Gallstones begin as small crystals and slowly grow into stones. They may cause no symptoms until the gallbladder tightens (contracts) and a gallstone is blocking the duct (gallbladder attack), which can cause pain. Cholelithiasis is also referred to as gallstones. There are two main types of gallstones:  Cholesterol stones. These are made of hardened cholesterol and are usually yellow-green in color. They are the most common type of gallstone. Cholesterol is a white, waxy, fat-like substance that is made in the liver.  Pigment stones. These are dark in color and are made of a red-yellow substance that forms when hemoglobin from red blood cells breaks down (bilirubin). What are the causes? This condition may be caused by an imbalance in the substances that bile is made of. This can happen if the bile:  Has too much bilirubin.  Has too much cholesterol.  Does not have enough bile salts. These salts help the body absorb and digest fats. In some cases, this condition can also be caused by the gallbladder not emptying completely or often enough. What increases the risk? The following factors may make you more likely to develop this condition:  Being female.  Having multiple pregnancies. Health care providers sometimes advise removing diseased gallbladders before future pregnancies.  Eating a diet that is heavy in fried foods, fat, and refined carbohydrates, like white bread and white rice.  Being obese.  Being older than age 22.  Prolonged use of medicines that contain female hormones (estrogen).  Having diabetes mellitus.  Rapidly losing weight.  Having a family history of gallstones.  Being of [place] or [place] descent.  Having an intestinal disease such as Crohn  disease.  Having metabolic syndrome.  Having cirrhosis.  Having severe types of anemia such as sickle cell anemia. What are the signs or symptoms? In most cases, there are no symptoms. These are known as silent gallstones. If a gallstone blocks the bile ducts, it can cause a gallbladder attack. The main symptom of a gallbladder attack is sudden pain in the upper right abdomen. The pain usually comes at night or after eating a large meal. The pain can last for one or several hours and can spread to the right shoulder or chest. If the bile duct is blocked for more than a few hours, it can cause infection or inflammation of the gallbladder, liver, or pancreas, which may cause:  Nausea.  Vomiting.  Abdominal pain that lasts for 5 hours or more.  Fever or chills.  Yellowing of the skin or the whites of the eyes (jaundice).  Dark urine.  Light-colored stools. How is this diagnosed? This condition may be diagnosed based on:  A physical exam.  Your medical history.  An ultrasound of your gallbladder.  CT scan.  MRI.  Blood tests to check for signs of infection or inflammation.  A scan of your gallbladder and bile ducts (biliary system) using nonharmful radioactive material and special cameras that can see the radioactive material (cholescintigram). This test checks to see how your gallbladder contracts and whether bile ducts are blocked.  Inserting a small tube with a camera on the end (endoscope) through your mouth to inspect bile ducts and check for blockages (endoscopic retrograde cholangiopancreatogram). How is this treated? Treatment for gallstones depends on the severity of the condition.  Silent gallstones do not need treatment. If the gallstones cause a gallbladder attack or other symptoms, treatment may be required. Options for treatment include:  Surgery to remove the gallbladder (cholecystectomy). This is the most common treatment.  Medicines to dissolve gallstones.  These are most effective at treating small gallstones. You may need to take medicines for up to 6-12 months.  Shock wave treatment (extracorporeal biliary lithotripsy). In this treatment, an ultrasound machine sends shock waves to the gallbladder to break gallstones into smaller pieces. These pieces can then be passed into the intestines or be dissolved by medicine. This is rarely used.  Removing gallstones through endoscopic retrograde cholangiopancreatogram. A small basket can be attached to the endoscope and used to capture and remove gallstones. Follow these instructions at home:  Take over-the-counter and prescription medicines only as told by your health care provider.  Maintain a healthy weight and follow a healthy diet. This includes: ? Reducing fatty foods, such as fried food. ? Reducing refined carbohydrates, like white bread and white rice. ? Increasing fiber. Aim for foods like almonds, fruit, and beans.  Keep all follow-up visits as told by your health care provider. This is important. Contact a health care provider if:  You think you have had a gallbladder attack.  You have been diagnosed with silent gallstones and you develop abdominal pain or indigestion. Get help right away if:  You have pain from a gallbladder attack that lasts for more than 2 hours.  You have abdominal pain that lasts for more than 5 hours.  You have a fever or chills.  You have persistent nausea and vomiting.  You develop jaundice.  You have dark urine or light-colored stools. Summary  Cholelithiasis (also called gallstones) is a form of gallbladder disease in which gallstones form in the gallbladder.  This condition is caused by an imbalance in the substances that make up bile. This can happen if the bile has too much cholesterol, too much bilirubin, or not enough bile salts.  You are more likely to develop this condition if you are female, pregnant, using medicines with estrogen, obese,  older than age 64, or have a family history of gallstones. You may also develop gallstones if you have diabetes, an intestinal disease, cirrhosis, or metabolic syndrome.  Treatment for gallstones depends on the severity of the condition. Silent gallstones do not need treatment.  If gallstones cause a gallbladder attack or other symptoms, treatment may be needed. The most common treatment is surgery to remove the gallbladder. This information is not intended to replace advice given to you by your health care provider. Make sure you discuss any questions you have with your health care provider. Document Released: 03/31/2005 Document Revised: 03/17/2017 Document Reviewed: 12/20/2015 Elsevier Patient Education  [place].                        Safe Medications in Pregnancy    Acne: Benzoyl Peroxide Salicylic Acid  Backache/Headache: Tylenol: 2 regular strength every 4 hours OR              2 Extra strength every 6 hours  Colds/Coughs/Allergies: Benadryl (alcohol free) 25 mg every 6 hours as needed Breath right strips Claritin Cepacol throat lozenges Chloraseptic throat spray Cold-Eeze- up to three times per day Cough drops, alcohol free Flonase (by prescription only) Guaifenesin Mucinex Robitussin DM (plain only, alcohol free) Saline nasal spray/drops Sudafed (pseudoephedrine) & Actifed ** use only after [redacted] weeks gestation and  if you do not have high blood pressure Tylenol Vicks Vaporub Zinc lozenges Zyrtec   Constipation: Colace Ducolax suppositories Fleet enema Glycerin suppositories Metamucil Milk of magnesia Miralax Senokot Smooth move tea  Diarrhea: Kaopectate Imodium A-D  *NO pepto Bismol  Hemorrhoids: Anusol Anusol HC Preparation H Tucks  Indigestion: Tums Maalox Mylanta Zantac  Pepcid  Insomnia: Benadryl (alcohol free) 25mg  every 6 hours as needed Tylenol PM Unisom, no Gelcaps  Leg Cramps: Tums MagGel  Nausea/Vomiting:    Bonine Dramamine Emetrol Ginger extract Sea bands Meclizine  Nausea medication to take during pregnancy:  Unisom (doxylamine succinate 25 mg tablets) Take one tablet daily at bedtime. If symptoms are not adequately controlled, the dose can be increased to a maximum recommended dose of two tablets daily (1/2 tablet in the morning, 1/2 tablet mid-afternoon and one at bedtime). Vitamin B6 100mg  tablets. Take one tablet twice a day (up to 200 mg per day).  Skin Rashes: Aveeno products Benadryl cream or 25mg  every 6 hours as needed Calamine Lotion 1% cortisone cream  Yeast infection: Gyne-lotrimin 7 Monistat 7   **If taking multiple medications, please check labels to avoid duplicating the same active ingredients **take medication as directed on the label ** Do not exceed 4000 mg of tylenol in 24 hours **Do not take medications that contain aspirin or ibuprofen
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
## 2018-12-12 ENCOUNTER — Telehealth: Payer: Self-pay | Admitting: Family Medicine

## 2018-12-12 ENCOUNTER — Ambulatory Visit: Payer: Medicaid Other

## 2018-12-12 NOTE — Telephone Encounter (Signed)
Patient called in stating she needs to cancel her appointment on 8/26 @ 2:30 for her injections. Patient stated she would like to start doing her injections herself at home. Spoke with Morey Hummingbird and she stated to let the patient know that this is fine but she will need to come to the office for a nurse visit to have teaching and pick-up her injections. Patient was given this information and verbalized understanding. Patient stated she will give the office a call back first thing in the morning to reschedule the appointment.

## 2018-12-14 ENCOUNTER — Ambulatory Visit: Payer: Medicaid Other

## 2018-12-17 ENCOUNTER — Telehealth: Payer: Self-pay | Admitting: Family Medicine

## 2018-12-17 NOTE — Telephone Encounter (Signed)
Attempted to call patient about her appointment on 9/1 @ 1:50. No answer left voicemail instructing patient to wear a face mask for the entire appointment and no visitors are allowed during the visit. Patient instructed not to attend the appointment if she was any symptoms. Symptom list and office number left.

## 2018-12-18 ENCOUNTER — Ambulatory Visit (INDEPENDENT_AMBULATORY_CARE_PROVIDER_SITE_OTHER): Payer: Medicaid Other | Admitting: General Practice

## 2018-12-18 ENCOUNTER — Other Ambulatory Visit (HOSPITAL_COMMUNITY): Payer: Self-pay | Admitting: Maternal & Fetal Medicine

## 2018-12-18 ENCOUNTER — Ambulatory Visit (HOSPITAL_COMMUNITY): Payer: Medicaid Other | Admitting: *Deleted

## 2018-12-18 ENCOUNTER — Ambulatory Visit (HOSPITAL_COMMUNITY)
Admission: RE | Admit: 2018-12-18 | Discharge: 2018-12-18 | Disposition: A | Discharge status: Home / Self Care | Payer: Medicaid Other | Source: Ambulatory Visit | Attending: Obstetrics and Gynecology | Admitting: Obstetrics and Gynecology

## 2018-12-18 ENCOUNTER — Other Ambulatory Visit: Payer: Self-pay

## 2018-12-18 ENCOUNTER — Encounter (HOSPITAL_COMMUNITY): Payer: Self-pay

## 2018-12-18 VITALS — BP 114/67 | HR 67 | Ht 63.0 in | Wt 203.0 lb

## 2018-12-18 DIAGNOSIS — J45909 Unspecified asthma, uncomplicated: Secondary | ICD-10-CM

## 2018-12-18 DIAGNOSIS — O9989 Other specified diseases and conditions complicating pregnancy, childbirth and the puerperium: Secondary | ICD-10-CM

## 2018-12-18 DIAGNOSIS — O99212 Obesity complicating pregnancy, second trimester: Secondary | ICD-10-CM

## 2018-12-18 DIAGNOSIS — O10012 Pre-existing essential hypertension complicating pregnancy, second trimester: Secondary | ICD-10-CM | POA: Diagnosis not present

## 2018-12-18 DIAGNOSIS — O09212 Supervision of pregnancy with history of pre-term labor, second trimester: Secondary | ICD-10-CM

## 2018-12-18 DIAGNOSIS — O0990 Supervision of high risk pregnancy, unspecified, unspecified trimester: Secondary | ICD-10-CM | POA: Insufficient documentation

## 2018-12-18 DIAGNOSIS — Z3A19 19 weeks gestation of pregnancy: Secondary | ICD-10-CM

## 2018-12-18 DIAGNOSIS — Z3042 Encounter for surveillance of injectable contraceptive: Secondary | ICD-10-CM | POA: Diagnosis present

## 2018-12-18 DIAGNOSIS — O09292 Supervision of pregnancy with other poor reproductive or obstetric history, second trimester: Secondary | ICD-10-CM | POA: Diagnosis not present

## 2018-12-18 DIAGNOSIS — O10912 Unspecified pre-existing hypertension complicating pregnancy, second trimester: Secondary | ICD-10-CM | POA: Insufficient documentation

## 2018-12-18 DIAGNOSIS — O4402 Placenta previa specified as without hemorrhage, second trimester: Secondary | ICD-10-CM

## 2018-12-18 DIAGNOSIS — Z363 Encounter for antenatal screening for malformations: Secondary | ICD-10-CM

## 2018-12-18 MED ORDER — BLOOD PRESSURE KIT DEVI: 1.0000 | Freq: Once | 0 refills | Status: AC

## 2018-12-18 NOTE — Progress Notes (Signed)
Caroline Ingram here for 17-P  Injection.  Injection administered without complication. Patient will return in one week for next injection. Patient requests to give herself the injections at home. Provided teaching/demonstration of injection to patient. Gave her remaining one auto-injector in the office and called for refill at the [facility] (phone: [phone]) to be shipped to the patient. Told patient to call us when she gets down to her last auto-injector so we can order refills. She verbalized understanding & had no questions.  Derinda Late, RN 12/18/2018  2:04 PM

## 2018-12-19 ENCOUNTER — Other Ambulatory Visit (HOSPITAL_COMMUNITY): Payer: Self-pay | Admitting: *Deleted

## 2018-12-19 DIAGNOSIS — O10919 Unspecified pre-existing hypertension complicating pregnancy, unspecified trimester: Secondary | ICD-10-CM

## 2018-12-21 ENCOUNTER — Ambulatory Visit: Payer: Medicaid Other

## 2018-12-28 ENCOUNTER — Other Ambulatory Visit: Payer: Self-pay

## 2018-12-28 ENCOUNTER — Telehealth: Payer: Medicaid Other | Admitting: Family Medicine

## 2018-12-28 ENCOUNTER — Telehealth: Payer: Medicaid Other | Admitting: Obstetrics & Gynecology

## 2018-12-28 DIAGNOSIS — O0990 Supervision of high risk pregnancy, unspecified, unspecified trimester: Secondary | ICD-10-CM

## 2018-12-28 NOTE — Progress Notes (Signed)
Patient opted to reschedule after being called by RN. See note below.

## 2018-12-28 NOTE — Progress Notes (Signed)
Called pt @ 0820 and pt requested to reschedule her appt because she is at work.  Pt stated that her FOB is questioning paternity because they were told from her first [place] that she had conceived in April.  Pt reports that she had not even had sex then.  Per chart review, the [place] report on 09/21/18 showed that the baby measured 6w 4d making her due date 05/13/19.  I explained to the pt that because her dating btwn her LMP and the [place] was about 21/2 weeks off the decision to go with first trimester [place] was preferred.  Pt verbalized understanding and that she will attempt to inform FOB.    Mel Almond, RN

## 2018-12-30 ENCOUNTER — Inpatient Hospital Stay (HOSPITAL_BASED_OUTPATIENT_CLINIC_OR_DEPARTMENT_OTHER): Payer: Medicaid Other

## 2018-12-30 ENCOUNTER — Other Ambulatory Visit: Payer: Self-pay | Admitting: Obstetrics and Gynecology

## 2018-12-30 ENCOUNTER — Encounter (HOSPITAL_COMMUNITY): Payer: Self-pay | Admitting: *Deleted

## 2018-12-30 ENCOUNTER — Telehealth (HOSPITAL_COMMUNITY): Payer: Self-pay

## 2018-12-30 ENCOUNTER — Inpatient Hospital Stay (HOSPITAL_COMMUNITY)
Admission: EM | Admit: 2018-12-30 | Discharge: 2018-12-30 | Disposition: A | Discharge status: Home / Self Care | Payer: Medicaid Other | Attending: Obstetrics and Gynecology | Admitting: Obstetrics and Gynecology

## 2018-12-30 ENCOUNTER — Other Ambulatory Visit: Payer: Self-pay

## 2018-12-30 DIAGNOSIS — O09292 Supervision of pregnancy with other poor reproductive or obstetric history, second trimester: Secondary | ICD-10-CM | POA: Diagnosis not present

## 2018-12-30 DIAGNOSIS — F17210 Nicotine dependence, cigarettes, uncomplicated: Secondary | ICD-10-CM | POA: Diagnosis not present

## 2018-12-30 DIAGNOSIS — O09212 Supervision of pregnancy with history of pre-term labor, second trimester: Secondary | ICD-10-CM | POA: Diagnosis not present

## 2018-12-30 DIAGNOSIS — O10012 Pre-existing essential hypertension complicating pregnancy, second trimester: Secondary | ICD-10-CM

## 2018-12-30 DIAGNOSIS — O4402 Placenta previa specified as without hemorrhage, second trimester: Secondary | ICD-10-CM | POA: Diagnosis not present

## 2018-12-30 DIAGNOSIS — O9989 Other specified diseases and conditions complicating pregnancy, childbirth and the puerperium: Secondary | ICD-10-CM | POA: Diagnosis not present

## 2018-12-30 DIAGNOSIS — O99212 Obesity complicating pregnancy, second trimester: Secondary | ICD-10-CM

## 2018-12-30 DIAGNOSIS — R1031 Right lower quadrant pain: Secondary | ICD-10-CM | POA: Diagnosis present

## 2018-12-30 DIAGNOSIS — Z3A20 20 weeks gestation of pregnancy: Secondary | ICD-10-CM | POA: Diagnosis not present

## 2018-12-30 DIAGNOSIS — O26892 Other specified pregnancy related conditions, second trimester: Secondary | ICD-10-CM

## 2018-12-30 DIAGNOSIS — O99332 Smoking (tobacco) complicating pregnancy, second trimester: Secondary | ICD-10-CM | POA: Insufficient documentation

## 2018-12-30 DIAGNOSIS — R102 Pelvic and perineal pain: Secondary | ICD-10-CM | POA: Diagnosis present

## 2018-12-30 DIAGNOSIS — J45909 Unspecified asthma, uncomplicated: Secondary | ICD-10-CM

## 2018-12-30 DIAGNOSIS — N949 Unspecified condition associated with female genital organs and menstrual cycle: Secondary | ICD-10-CM

## 2018-12-30 DIAGNOSIS — R109 Unspecified abdominal pain: Secondary | ICD-10-CM

## 2018-12-30 LAB — URINALYSIS, ROUTINE W REFLEX MICROSCOPIC
Bilirubin Urine: NEGATIVE
Glucose, UA: NEGATIVE mg/dL
Hgb urine dipstick: NEGATIVE
Ketones, ur: NEGATIVE mg/dL
Leukocytes,Ua: NEGATIVE
Nitrite: NEGATIVE
Protein, ur: NEGATIVE mg/dL
Specific Gravity, Urine: 1.029 (ref 1.005–1.030)
pH: 6 (ref 5.0–8.0)

## 2018-12-30 MED ORDER — CYCLOBENZAPRINE HCL 10 MG PO TABS: 10.0000 mg | ORAL_TABLET | Freq: Every evening | ORAL | 0 refills | Status: DC | PRN

## 2018-12-30 NOTE — MAU Note (Signed)
Caroline Ingram is a 33 y.o. at [redacted]w[redacted]d here in MAU reporting: right lower abdominal pain, has been going on for a week but states for the past 2 days it has been much worse. It is sharp and hurts more when she walks. No bleeding or abnormal discharge. States she knows she has stones in her gallbladder and is very frustrated that she was sent over to the MAU because she knows it is not pregnancy related.   Onset of complaint: ongoing for a week but worse in the past couple of days  Pain score: 10/10  Vitals:   12/30/18 0759 12/30/18 0839  BP: 112/68 101/74  Pulse: 72 68  Resp: 17 16  Temp:  98.3 F (36.8 C)  SpO2:  100%     Lab orders placed from triage: UA

## 2018-12-30 NOTE — Telephone Encounter (Signed)
Patient called stating she was seen in MAU earlier and prescribed flexeril. States she is at [facility] and there is nothing for her there. RN informed according to discharge summary medication was sent to [facility] pharmacy that was on file. Patient states that she is in pain and needs her medication sent into the [facility] on [address], "otherwise I will come back up there and sit all day until I get something for pain". Rolitta CNM in MAU department and notified of incoming phone call and patients response. CNM states she will re-send medication.

## 2018-12-30 NOTE — ED Triage Notes (Signed)
Called MAU , report to Sharen Heck Call transport for transfer
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
## 2018-12-30 NOTE — Progress Notes (Signed)
Patient called to request her Rx be sent to a different pharmacy than was used at the time she was d/c'd this AM. Rx resent to [facility] on [address]. TC and message to [facility] to cancel previous Rx.  Laury Deep, CNM

## 2018-12-30 NOTE — ED Triage Notes (Signed)
Pt. Stated, Caroline Ingram had this rt. Lower stomach pain and really hurts when I walk. Started one week ago.

## 2019-01-01 ENCOUNTER — Other Ambulatory Visit: Payer: Self-pay

## 2019-01-01 ENCOUNTER — Emergency Department (HOSPITAL_COMMUNITY)
Admission: EM | Admit: 2019-01-01 | Discharge: 2019-01-01 | Disposition: A | Discharge status: Home / Self Care | Payer: Medicaid Other | Attending: Emergency Medicine | Admitting: Emergency Medicine

## 2019-01-01 DIAGNOSIS — L02412 Cutaneous abscess of left axilla: Secondary | ICD-10-CM | POA: Diagnosis not present

## 2019-01-01 DIAGNOSIS — L0291 Cutaneous abscess, unspecified: Secondary | ICD-10-CM

## 2019-01-01 DIAGNOSIS — F17210 Nicotine dependence, cigarettes, uncomplicated: Secondary | ICD-10-CM | POA: Diagnosis not present

## 2019-01-01 DIAGNOSIS — I10 Essential (primary) hypertension: Secondary | ICD-10-CM | POA: Insufficient documentation

## 2019-01-01 DIAGNOSIS — Z7982 Long term (current) use of aspirin: Secondary | ICD-10-CM | POA: Diagnosis not present

## 2019-01-01 DIAGNOSIS — F909 Attention-deficit hyperactivity disorder, unspecified type: Secondary | ICD-10-CM | POA: Insufficient documentation

## 2019-01-01 DIAGNOSIS — Z79899 Other long term (current) drug therapy: Secondary | ICD-10-CM | POA: Insufficient documentation

## 2019-01-01 MED ORDER — LIDOCAINE-EPINEPHRINE (PF) 2 %-1:200000 IJ SOLN
20.0000 mL | Freq: Once | INTRAMUSCULAR | Status: AC
  Administered 2019-01-01: 20 mL
  Filled 2019-01-01: qty 20

## 2019-01-01 MED ORDER — ACETAMINOPHEN 325 MG PO TABS
650.0000 mg | ORAL_TABLET | Freq: Once | ORAL | Status: AC
  Administered 2019-01-01: 650 mg via ORAL
  Filled 2019-01-01: qty 2

## 2019-01-01 NOTE — ED Notes (Signed)
ED Provider at bedside. 

## 2019-01-01 NOTE — ED Notes (Signed)
Patient verbalizes understanding of discharge instructions. Opportunity for questioning and answers were provided. Armband removed by staff, pt discharged from ED.  
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
## 2019-01-01 NOTE — Discharge Instructions (Addendum)
Please read attached information. If you experience any new or worsening signs or symptoms please return to the emergency room for evaluation. Please follow-up with your primary care provider or specialist as discussed.  °

## 2019-01-01 NOTE — ED Triage Notes (Signed)
Pt in c/o L under arm abcess onset x 2-3 wks, pt has 2 cm x 2 cm abcess with skin intact under L arm without drainage present, pt [redacted] wks pregnant, pt reports feeling fetal movement, A&O x4, afebrile

## 2019-01-11 ENCOUNTER — Encounter (HOSPITAL_COMMUNITY): Payer: Self-pay | Admitting: *Deleted

## 2019-01-11 ENCOUNTER — Inpatient Hospital Stay (HOSPITAL_COMMUNITY)
Admission: AD | Admit: 2019-01-11 | Discharge: 2019-01-11 | Disposition: A | Discharge status: Home / Self Care | Payer: Medicaid Other | Attending: Obstetrics and Gynecology | Admitting: Obstetrics and Gynecology

## 2019-01-11 ENCOUNTER — Ambulatory Visit: Payer: Medicaid Other | Admitting: Gastroenterology

## 2019-01-11 ENCOUNTER — Other Ambulatory Visit: Payer: Self-pay

## 2019-01-11 DIAGNOSIS — O162 Unspecified maternal hypertension, second trimester: Secondary | ICD-10-CM | POA: Diagnosis not present

## 2019-01-11 DIAGNOSIS — R102 Pelvic and perineal pain: Secondary | ICD-10-CM | POA: Diagnosis not present

## 2019-01-11 DIAGNOSIS — O26892 Other specified pregnancy related conditions, second trimester: Secondary | ICD-10-CM | POA: Insufficient documentation

## 2019-01-11 DIAGNOSIS — R1031 Right lower quadrant pain: Secondary | ICD-10-CM | POA: Diagnosis not present

## 2019-01-11 DIAGNOSIS — O0992 Supervision of high risk pregnancy, unspecified, second trimester: Secondary | ICD-10-CM | POA: Diagnosis not present

## 2019-01-11 DIAGNOSIS — Z79899 Other long term (current) drug therapy: Secondary | ICD-10-CM | POA: Diagnosis not present

## 2019-01-11 DIAGNOSIS — Z7982 Long term (current) use of aspirin: Secondary | ICD-10-CM | POA: Insufficient documentation

## 2019-01-11 DIAGNOSIS — O4402 Placenta previa specified as without hemorrhage, second trimester: Secondary | ICD-10-CM | POA: Diagnosis not present

## 2019-01-11 DIAGNOSIS — O0990 Supervision of high risk pregnancy, unspecified, unspecified trimester: Secondary | ICD-10-CM

## 2019-01-11 DIAGNOSIS — O99512 Diseases of the respiratory system complicating pregnancy, second trimester: Secondary | ICD-10-CM | POA: Diagnosis not present

## 2019-01-11 DIAGNOSIS — O99332 Smoking (tobacco) complicating pregnancy, second trimester: Secondary | ICD-10-CM | POA: Diagnosis not present

## 2019-01-11 DIAGNOSIS — J45909 Unspecified asthma, uncomplicated: Secondary | ICD-10-CM | POA: Diagnosis not present

## 2019-01-11 DIAGNOSIS — Z3A22 22 weeks gestation of pregnancy: Secondary | ICD-10-CM | POA: Insufficient documentation

## 2019-01-11 DIAGNOSIS — F17210 Nicotine dependence, cigarettes, uncomplicated: Secondary | ICD-10-CM | POA: Diagnosis not present

## 2019-01-11 DIAGNOSIS — O26899 Other specified pregnancy related conditions, unspecified trimester: Secondary | ICD-10-CM

## 2019-01-11 HISTORY — DX: Other specified diseases of gallbladder: K82.8

## 2019-01-11 LAB — URINALYSIS, ROUTINE W REFLEX MICROSCOPIC
Bilirubin Urine: NEGATIVE
Glucose, UA: NEGATIVE mg/dL
Hgb urine dipstick: NEGATIVE
Ketones, ur: NEGATIVE mg/dL
Leukocytes,Ua: NEGATIVE
Nitrite: NEGATIVE
Protein, ur: NEGATIVE mg/dL
Specific Gravity, Urine: 1.013 (ref 1.005–1.030)
pH: 8 (ref 5.0–8.0)

## 2019-01-11 LAB — CBC
HCT: 32.1 % — ABNORMAL LOW (ref 36.0–46.0)
Hemoglobin: 10.3 g/dL — ABNORMAL LOW (ref 12.0–15.0)
MCH: 27.9 pg (ref 26.0–34.0)
MCHC: 32.1 g/dL (ref 30.0–36.0)
MCV: 87 fL (ref 80.0–100.0)
Platelets: 237 10*3/uL (ref 150–400)
RBC: 3.69 MIL/uL — ABNORMAL LOW (ref 3.87–5.11)
RDW: 13.2 % (ref 11.5–15.5)
WBC: 7.8 10*3/uL (ref 4.0–10.5)
nRBC: 0 % (ref 0.0–0.2)

## 2019-01-11 MED ORDER — IBUPROFEN 600 MG PO TABS: 600.0000 mg | ORAL_TABLET | Freq: Four times a day (QID) | ORAL | 0 refills | Status: DC

## 2019-01-11 MED ORDER — IBUPROFEN 600 MG PO TABS
600.0000 mg | ORAL_TABLET | Freq: Once | ORAL | Status: AC
  Administered 2019-01-11: 600 mg via ORAL
  Filled 2019-01-11: qty 1

## 2019-01-11 NOTE — MAU Note (Signed)
Patient has had ongoing pain in RLQ for past few weeks and was diagnosed with round ligament pain in MAU and given muscle relaxers and an abdominal binder.  Pt reports these are not helping.  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 D/t pain when she bends down.  Tried marijuana last night to see if it helps, but it doesn't.  "Not sure if I need to do less with my job or if I'm tearing something."  Hx of placenta previa, no bleeding.

## 2019-01-11 NOTE — Discharge Instructions (Signed)
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
## 2019-01-14 ENCOUNTER — Telehealth: Payer: Self-pay | Admitting: Family Medicine

## 2019-01-14 NOTE — Telephone Encounter (Signed)
Patient is requesting a call back from the clinical staff for questions about her medicine.

## 2019-01-15 ENCOUNTER — Ambulatory Visit (HOSPITAL_COMMUNITY): Payer: No Typology Code available for payment source

## 2019-01-15 ENCOUNTER — Encounter (HOSPITAL_COMMUNITY): Payer: Self-pay

## 2019-01-16 NOTE — Telephone Encounter (Signed)
I called Annibelle and left a message I am returning her call and if she still has a question can call us back tomorrow or send a Mychart message. Daejah Klebba,RN

## 2019-01-18 ENCOUNTER — Telehealth: Payer: Medicaid Other | Admitting: Obstetrics and Gynecology

## 2019-01-20 ENCOUNTER — Inpatient Hospital Stay (HOSPITAL_COMMUNITY)
Admission: AD | Admit: 2019-01-20 | Discharge: 2019-01-20 | Disposition: A | Discharge status: Home / Self Care | Payer: Medicaid Other | Attending: Obstetrics and Gynecology | Admitting: Obstetrics and Gynecology

## 2019-01-20 ENCOUNTER — Inpatient Hospital Stay (HOSPITAL_BASED_OUTPATIENT_CLINIC_OR_DEPARTMENT_OTHER): Payer: Medicaid Other

## 2019-01-20 ENCOUNTER — Other Ambulatory Visit: Payer: Self-pay

## 2019-01-20 ENCOUNTER — Encounter (HOSPITAL_COMMUNITY): Payer: Self-pay

## 2019-01-20 DIAGNOSIS — O09292 Supervision of pregnancy with other poor reproductive or obstetric history, second trimester: Secondary | ICD-10-CM

## 2019-01-20 DIAGNOSIS — F17210 Nicotine dependence, cigarettes, uncomplicated: Secondary | ICD-10-CM | POA: Insufficient documentation

## 2019-01-20 DIAGNOSIS — R102 Pelvic and perineal pain: Secondary | ICD-10-CM

## 2019-01-20 DIAGNOSIS — Z3A23 23 weeks gestation of pregnancy: Secondary | ICD-10-CM | POA: Diagnosis not present

## 2019-01-20 DIAGNOSIS — O10012 Pre-existing essential hypertension complicating pregnancy, second trimester: Secondary | ICD-10-CM | POA: Diagnosis not present

## 2019-01-20 DIAGNOSIS — O4692 Antepartum hemorrhage, unspecified, second trimester: Secondary | ICD-10-CM

## 2019-01-20 DIAGNOSIS — O4402 Placenta previa specified as without hemorrhage, second trimester: Secondary | ICD-10-CM | POA: Diagnosis not present

## 2019-01-20 DIAGNOSIS — O99212 Obesity complicating pregnancy, second trimester: Secondary | ICD-10-CM

## 2019-01-20 DIAGNOSIS — F419 Anxiety disorder, unspecified: Secondary | ICD-10-CM

## 2019-01-20 DIAGNOSIS — O10912 Unspecified pre-existing hypertension complicating pregnancy, second trimester: Secondary | ICD-10-CM | POA: Diagnosis not present

## 2019-01-20 DIAGNOSIS — O99332 Smoking (tobacco) complicating pregnancy, second trimester: Secondary | ICD-10-CM | POA: Insufficient documentation

## 2019-01-20 DIAGNOSIS — O99342 Other mental disorders complicating pregnancy, second trimester: Secondary | ICD-10-CM

## 2019-01-20 DIAGNOSIS — O10919 Unspecified pre-existing hypertension complicating pregnancy, unspecified trimester: Secondary | ICD-10-CM

## 2019-01-20 DIAGNOSIS — Z7982 Long term (current) use of aspirin: Secondary | ICD-10-CM | POA: Diagnosis not present

## 2019-01-20 DIAGNOSIS — O26892 Other specified pregnancy related conditions, second trimester: Secondary | ICD-10-CM | POA: Diagnosis not present

## 2019-01-20 DIAGNOSIS — O09212 Supervision of pregnancy with history of pre-term labor, second trimester: Secondary | ICD-10-CM

## 2019-01-20 DIAGNOSIS — M7918 Myalgia, other site: Secondary | ICD-10-CM

## 2019-01-20 DIAGNOSIS — R1030 Lower abdominal pain, unspecified: Secondary | ICD-10-CM | POA: Diagnosis present

## 2019-01-20 LAB — URINALYSIS, ROUTINE W REFLEX MICROSCOPIC
Bilirubin Urine: NEGATIVE
Glucose, UA: NEGATIVE mg/dL
Hgb urine dipstick: NEGATIVE
Ketones, ur: NEGATIVE mg/dL
Leukocytes,Ua: NEGATIVE
Nitrite: NEGATIVE
Protein, ur: NEGATIVE mg/dL
Specific Gravity, Urine: 1.023 (ref 1.005–1.030)
pH: 6 (ref 5.0–8.0)

## 2019-01-20 MED ORDER — IBUPROFEN 600 MG PO TABS
600.0000 mg | ORAL_TABLET | Freq: Once | ORAL | Status: AC
  Administered 2019-01-20: 600 mg via ORAL
  Filled 2019-01-20: qty 1

## 2019-01-20 MED ORDER — CYCLOBENZAPRINE HCL 10 MG PO TABS: 5.0000 mg | ORAL_TABLET | Freq: Three times a day (TID) | ORAL | 0 refills | Status: DC | PRN

## 2019-01-20 NOTE — Progress Notes (Signed)
Pt retching for the last few mins

## 2019-01-20 NOTE — MAU Note (Signed)
Pt works in home health care. Told not to lift >10lbs. Work note sent. Helped lift a pt who was about to fall off the toilet. Pt reports having RLQ pain. It is constant, but at times has sharp pain. Had some spotting last night, but none today. Still having the pain. Unsure if she pulled a muscle. Has round ligament pain and uses a support belt.
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
## 2019-01-21 ENCOUNTER — Encounter: Payer: Medicaid Other | Admitting: Family Medicine

## 2019-01-21 NOTE — Progress Notes (Signed)
@  4:55pm lvm that I will call back in 5-10 mins for appointment     5:32p-2nd attempt, no answer, will need to reschedule.Left VM.

## 2019-01-22 ENCOUNTER — Other Ambulatory Visit: Payer: Self-pay

## 2019-01-22 ENCOUNTER — Telehealth (INDEPENDENT_AMBULATORY_CARE_PROVIDER_SITE_OTHER): Payer: Medicaid Other | Admitting: Family Medicine

## 2019-01-22 DIAGNOSIS — O09899 Supervision of other high risk pregnancies, unspecified trimester: Secondary | ICD-10-CM

## 2019-01-22 DIAGNOSIS — O09892 Supervision of other high risk pregnancies, second trimester: Secondary | ICD-10-CM | POA: Diagnosis not present

## 2019-01-22 DIAGNOSIS — O0992 Supervision of high risk pregnancy, unspecified, second trimester: Secondary | ICD-10-CM | POA: Diagnosis not present

## 2019-01-22 DIAGNOSIS — O26892 Other specified pregnancy related conditions, second trimester: Secondary | ICD-10-CM

## 2019-01-22 DIAGNOSIS — Z3A24 24 weeks gestation of pregnancy: Secondary | ICD-10-CM

## 2019-01-22 DIAGNOSIS — O0990 Supervision of high risk pregnancy, unspecified, unspecified trimester: Secondary | ICD-10-CM

## 2019-01-22 DIAGNOSIS — O4402 Placenta previa specified as without hemorrhage, second trimester: Secondary | ICD-10-CM

## 2019-01-22 DIAGNOSIS — R102 Pelvic and perineal pain: Secondary | ICD-10-CM

## 2019-01-22 NOTE — Progress Notes (Signed)
I connected with  Caroline Ingram on 01/22/19 at  2:15 PM EDT by telephone and verified that I am speaking with the correct person using two identifiers.   I discussed the limitations, risks, security and privacy concerns of performing an evaluation and management service by telephone and the availability of in person appointments. I also discussed with the patient that there may be a patient responsible charge related to this service. The patient expressed understanding and agreed to proceed.  Britt, CMA 01/22/2019  1:45 PM
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
## 2019-01-22 NOTE — Patient Instructions (Signed)
Placenta Previa °Placenta previa is a condition in which the placenta implants in the lower part of the uterus in pregnant women. The placenta either partially or completely covers the opening to the cervix. This is a problem because the baby must pass through the cervix during delivery. There are three types of placenta previa: °· Marginal placenta previa. The placenta reaches within an inch (2.5 cm) of the cervical opening but does not cover it. °· Partial placenta previa. The placenta covers part of the cervical opening. °· Complete placenta previa. The placenta covers the entire cervical opening. °If the previa is marginal or partial and it is diagnosed in the first half of pregnancy, the placenta may move into a normal position as the pregnancy progresses and may no longer cover the cervix. It is important to keep all prenatal visits with your health care provider so you can be more closely monitored. °What are the causes? °The cause of this condition is not known. °What increases the risk? °This condition is more likely to develop in women who: °· Are carrying more than one baby (multiples). °· Have an abnormally shaped uterus. °· Have scars on the lining of the uterus. °· Have had surgeries involving the uterus, such as a cesarean delivery. °· Have delivered a baby before. °· Have a history of placenta previa. °· Have smoked or used cocaine during pregnancy. °· Are age 35 or older during pregnancy. °What are the signs or symptoms? °The main symptom of this condition is sudden, painless vaginal bleeding during the second half of pregnancy. The amount of bleeding can be very light at first, and it usually stops on its own. Heavier bleeding episodes may also happen. Some women with placenta previa may have no bleeding at all. °How is this diagnosed? °· This condition is diagnosed: °? From an ultrasound. This test uses sound waves to find where the placenta is located before you have any bleeding  episodes. °? During a checkup after vaginal bleeding is noticed. °· If you are diagnosed with a partial or complete previa, digital exams with fingers will generally be avoided. Your health care provider will still perform a speculum exam. °· If you did not have an ultrasound during your pregnancy, placenta previa may not be diagnosed until bleeding occurs during labor. °How is this treated? °Treatment for this condition may include: °· Decreased activity. °· Bed rest at home or in the hospital. °· Pelvic rest. Nothing is placed inside the vagina during pelvic rest. This means not having sex and not using tampons or douches. °· A blood transfusion to replace blood that you have lost (maternal blood loss). °· A cesarean delivery. This may be performed if: °? The bleeding is heavy and cannot be controlled. °? The placenta completely covers the cervix. °· Medicines to stop premature labor or to help the baby's lungs to mature. This treatment may be used if you need delivery before your pregnancy is full-term. °Your treatment will be decided based on: °· How much you are bleeding, or whether the bleeding has stopped. °· How far along you are in your pregnancy. °· The condition of your baby. °· The type of placenta previa that you have. ° °Follow these instructions at home: °· Get plenty of rest and lessen activity as told by your health care provider. °· Stay on bed rest for as long as told by your health care provider. °· Do not have sex, use tampons, use a douche, or place anything inside of   your vagina if your health care provider recommended pelvic rest. °· Take over-the-counter and prescription medicines as told by your health care provider. °· Keep all follow-up visits as told by your health care provider. This is important. °Get help right away if: °· You have vaginal bleeding, even if in small amounts and even if you have no pain. °· You have cramping or regular contractions. °· You have pain in your abdomen or  your lower back. °· You have a feeling of increased pressure in your pelvis. °· You have increased watery or bloody mucus from the vagina. °This information is not intended to replace advice given to you by your health care provider. Make sure you discuss any questions you have with your health care provider. °Document Released: 04/04/2005 Document Revised: 03/17/2017 Document Reviewed: 10/17/2015 °Elsevier Patient Education © 2020 [employer] Inc[employer] ° °

## 2019-01-24 ENCOUNTER — Ambulatory Visit (HOSPITAL_COMMUNITY)
Admission: RE | Admit: 2019-01-24 | Discharge: 2019-01-24 | Disposition: A | Discharge status: Home / Self Care | Payer: Medicaid Other | Source: Ambulatory Visit | Attending: Maternal & Fetal Medicine | Admitting: Maternal & Fetal Medicine

## 2019-01-24 ENCOUNTER — Other Ambulatory Visit (HOSPITAL_COMMUNITY): Payer: Self-pay | Admitting: *Deleted

## 2019-01-24 ENCOUNTER — Other Ambulatory Visit: Payer: Self-pay

## 2019-01-24 ENCOUNTER — Ambulatory Visit (HOSPITAL_COMMUNITY): Payer: Medicaid Other | Admitting: *Deleted

## 2019-01-24 ENCOUNTER — Encounter (HOSPITAL_COMMUNITY): Payer: Self-pay

## 2019-01-24 DIAGNOSIS — R102 Pelvic and perineal pain: Secondary | ICD-10-CM

## 2019-01-24 DIAGNOSIS — O99212 Obesity complicating pregnancy, second trimester: Secondary | ICD-10-CM

## 2019-01-24 DIAGNOSIS — O4402 Placenta previa specified as without hemorrhage, second trimester: Secondary | ICD-10-CM | POA: Diagnosis not present

## 2019-01-24 DIAGNOSIS — O99332 Smoking (tobacco) complicating pregnancy, second trimester: Secondary | ICD-10-CM

## 2019-01-24 DIAGNOSIS — O0990 Supervision of high risk pregnancy, unspecified, unspecified trimester: Secondary | ICD-10-CM | POA: Insufficient documentation

## 2019-01-24 DIAGNOSIS — O10919 Unspecified pre-existing hypertension complicating pregnancy, unspecified trimester: Secondary | ICD-10-CM

## 2019-01-24 DIAGNOSIS — O10012 Pre-existing essential hypertension complicating pregnancy, second trimester: Secondary | ICD-10-CM

## 2019-01-24 DIAGNOSIS — O26892 Other specified pregnancy related conditions, second trimester: Secondary | ICD-10-CM | POA: Insufficient documentation

## 2019-01-24 DIAGNOSIS — Z3A24 24 weeks gestation of pregnancy: Secondary | ICD-10-CM

## 2019-01-24 DIAGNOSIS — Z362 Encounter for other antenatal screening follow-up: Secondary | ICD-10-CM

## 2019-01-24 DIAGNOSIS — O09292 Supervision of pregnancy with other poor reproductive or obstetric history, second trimester: Secondary | ICD-10-CM | POA: Diagnosis not present

## 2019-01-24 DIAGNOSIS — O09212 Supervision of pregnancy with history of pre-term labor, second trimester: Secondary | ICD-10-CM

## 2019-02-04 ENCOUNTER — Telehealth: Payer: Self-pay | Admitting: *Deleted

## 2019-02-04 NOTE — Telephone Encounter (Signed)
I [facility] and asked for shipment to be sent asap since she ran out a week ago. They informed me they can ship it out today overnight and she should get it tomorrow.  I called [facility] and informed her I am returning her call and that I have called the compounding pharmacy and she should get her [facility] tomorrow and asked her to call us if she does not get it. She voices understanding.  Zenith Kercheval,RN

## 2019-02-04 NOTE — Telephone Encounter (Signed)
Caroline Ingram called and left a voice message this am that she is calling about her Caroline Ingram - states a nurse told her to call when she was down to 2 ; which she did. States she hasn't received her refill and hasn't had a shot in a week. States she doesn't have gas to come to the office. States she doesn't understand why she hasn't gotten her refill. Caroline Curtice,RN

## 2019-02-21 ENCOUNTER — Encounter: Payer: Medicaid Other | Admitting: Obstetrics & Gynecology

## 2019-02-21 ENCOUNTER — Other Ambulatory Visit: Payer: Self-pay

## 2019-02-21 ENCOUNTER — Encounter: Payer: Self-pay | Admitting: Obstetrics & Gynecology

## 2019-02-21 ENCOUNTER — Telehealth: Payer: Self-pay | Admitting: Obstetrics & Gynecology

## 2019-02-21 NOTE — Progress Notes (Signed)
Attempted to contact pt by phone at 1320. Error message received saying call could not be completed at this time.   Called pt second time at 1331 and received same error message.    [place] office will reschedule pt.

## 2019-02-21 NOTE — Telephone Encounter (Signed)
Attempted to contact patient to get her rescheduled for her missed ob appointment. No answer and unable to leave a message due to the number stating that the call could not be completed at this time. No show letter mailed

## 2019-02-22 ENCOUNTER — Encounter (HOSPITAL_COMMUNITY): Payer: Self-pay

## 2019-02-22 ENCOUNTER — Ambulatory Visit (HOSPITAL_COMMUNITY): Payer: No Typology Code available for payment source | Attending: Obstetrics and Gynecology

## 2019-02-22 ENCOUNTER — Ambulatory Visit (HOSPITAL_COMMUNITY): Payer: No Typology Code available for payment source

## 2019-02-27 ENCOUNTER — Other Ambulatory Visit: Payer: Self-pay

## 2019-02-27 ENCOUNTER — Telehealth: Payer: Medicaid Other | Admitting: Obstetrics and Gynecology

## 2019-02-27 DIAGNOSIS — O0990 Supervision of high risk pregnancy, unspecified, unspecified trimester: Secondary | ICD-10-CM

## 2019-02-27 NOTE — Progress Notes (Signed)
Patient did not answer for her virtual OB visit. She will be called to reschedule.

## 2019-02-27 NOTE — Progress Notes (Signed)
8:53A- Called pt for My Chart visit, no answer, received message stating ,call cannot  Be completed at this time, please hangup ans call again later.Will call back in 10 minutes.   9:10a-2nd try, still no answer, no VM setup to leave a message.

## 2019-03-01 ENCOUNTER — Other Ambulatory Visit: Payer: Self-pay

## 2019-03-01 ENCOUNTER — Emergency Department (HOSPITAL_COMMUNITY)
Admission: EM | Admit: 2019-03-01 | Discharge: 2019-03-02 | Disposition: A | Discharge status: Home / Self Care | Payer: Medicaid Other | Attending: Emergency Medicine | Admitting: Emergency Medicine

## 2019-03-01 ENCOUNTER — Encounter (HOSPITAL_COMMUNITY): Payer: Self-pay

## 2019-03-01 DIAGNOSIS — R0602 Shortness of breath: Secondary | ICD-10-CM | POA: Insufficient documentation

## 2019-03-01 DIAGNOSIS — Z3A29 29 weeks gestation of pregnancy: Secondary | ICD-10-CM | POA: Diagnosis not present

## 2019-03-01 DIAGNOSIS — O2693 Pregnancy related conditions, unspecified, third trimester: Secondary | ICD-10-CM | POA: Insufficient documentation

## 2019-03-01 DIAGNOSIS — J4521 Mild intermittent asthma with (acute) exacerbation: Secondary | ICD-10-CM | POA: Diagnosis not present

## 2019-03-01 DIAGNOSIS — I10 Essential (primary) hypertension: Secondary | ICD-10-CM | POA: Diagnosis not present

## 2019-03-01 DIAGNOSIS — Z7982 Long term (current) use of aspirin: Secondary | ICD-10-CM | POA: Insufficient documentation

## 2019-03-01 DIAGNOSIS — R102 Pelvic and perineal pain: Secondary | ICD-10-CM

## 2019-03-01 DIAGNOSIS — R05 Cough: Secondary | ICD-10-CM | POA: Insufficient documentation

## 2019-03-01 DIAGNOSIS — O26892 Other specified pregnancy related conditions, second trimester: Secondary | ICD-10-CM

## 2019-03-01 DIAGNOSIS — F17210 Nicotine dependence, cigarettes, uncomplicated: Secondary | ICD-10-CM | POA: Diagnosis not present

## 2019-03-01 DIAGNOSIS — Z20828 Contact with and (suspected) exposure to other viral communicable diseases: Secondary | ICD-10-CM | POA: Insufficient documentation

## 2019-03-01 DIAGNOSIS — Z79899 Other long term (current) drug therapy: Secondary | ICD-10-CM | POA: Insufficient documentation

## 2019-03-01 DIAGNOSIS — R059 Cough, unspecified: Secondary | ICD-10-CM

## 2019-03-01 DIAGNOSIS — J40 Bronchitis, not specified as acute or chronic: Secondary | ICD-10-CM

## 2019-03-01 DIAGNOSIS — F909 Attention-deficit hyperactivity disorder, unspecified type: Secondary | ICD-10-CM | POA: Diagnosis not present

## 2019-03-01 DIAGNOSIS — F319 Bipolar disorder, unspecified: Secondary | ICD-10-CM | POA: Insufficient documentation

## 2019-03-01 DIAGNOSIS — O4402 Placenta previa specified as without hemorrhage, second trimester: Secondary | ICD-10-CM

## 2019-03-01 DIAGNOSIS — O0990 Supervision of high risk pregnancy, unspecified, unspecified trimester: Secondary | ICD-10-CM

## 2019-03-01 NOTE — ED Triage Notes (Signed)
Pt comes via [facility] for cough, fever, SOB for the past few days, pt is [redacted] weeks pregnant, reports abd pain when coughing, and decreased fetal movement, denies sick contacts
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
## 2019-03-02 ENCOUNTER — Encounter (HOSPITAL_COMMUNITY): Payer: Self-pay

## 2019-03-02 ENCOUNTER — Other Ambulatory Visit: Payer: Self-pay

## 2019-03-02 LAB — SARS CORONAVIRUS 2 (TAT 6-24 HRS): SARS Coronavirus 2: NEGATIVE

## 2019-03-02 LAB — URINALYSIS, ROUTINE W REFLEX MICROSCOPIC
Bilirubin Urine: NEGATIVE
Glucose, UA: NEGATIVE mg/dL
Hgb urine dipstick: NEGATIVE
Ketones, ur: 20 mg/dL — AB
Leukocytes,Ua: NEGATIVE
Nitrite: NEGATIVE
Protein, ur: NEGATIVE mg/dL
Specific Gravity, Urine: 1.019 (ref 1.005–1.030)
pH: 6 (ref 5.0–8.0)

## 2019-03-02 MED ORDER — PREDNISONE 20 MG PO TABS: 60.0000 mg | ORAL_TABLET | Freq: Every day | ORAL | 0 refills | Status: DC

## 2019-03-02 MED ORDER — ALBUTEROL SULFATE HFA 108 (90 BASE) MCG/ACT IN AERS: 2.0000 | INHALATION_SPRAY | RESPIRATORY_TRACT | 2 refills | Status: DC | PRN

## 2019-03-02 MED ORDER — IPRATROPIUM-ALBUTEROL 0.5-2.5 (3) MG/3ML IN SOLN: 3.0000 mL | Freq: Once | RESPIRATORY_TRACT | Status: DC

## 2019-03-02 MED ORDER — ALBUTEROL SULFATE HFA 108 (90 BASE) MCG/ACT IN AERS
8.0000 | INHALATION_SPRAY | RESPIRATORY_TRACT | Status: DC | PRN
  Administered 2019-03-02: 04:00:00 8 via RESPIRATORY_TRACT
  Filled 2019-03-02: qty 6.7

## 2019-03-02 MED ORDER — AMOXICILLIN 500 MG PO CAPS: 1000.0000 mg | ORAL_CAPSULE | Freq: Two times a day (BID) | ORAL | 0 refills | Status: DC

## 2019-03-02 MED ORDER — AMOXICILLIN 500 MG PO CAPS
1000.0000 mg | ORAL_CAPSULE | Freq: Once | ORAL | Status: AC
  Administered 2019-03-02: 1000 mg via ORAL
  Filled 2019-03-02: qty 2

## 2019-03-02 MED ORDER — DEXAMETHASONE SODIUM PHOSPHATE 10 MG/ML IJ SOLN
10.0000 mg | Freq: Once | INTRAMUSCULAR | Status: AC
  Administered 2019-03-02: 10 mg via INTRAMUSCULAR
  Filled 2019-03-02: qty 1
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
## 2019-03-02 NOTE — MAU Note (Signed)
RT contacted for patients ordered breathing treatment. RT stated the order needs to be D/C due to the unknown status of COVID.

## 2019-03-02 NOTE — ED Notes (Signed)
Discharge instructions discussed with pt. Pt verbalized understanding. Pt stable and ambulatory. No signature pad available. 

## 2019-03-02 NOTE — Progress Notes (Signed)
Called to give Neb treatment to patient in MAU.  Patient has a pending COVID swab test.  RT explained to RN that patient needed to have a negative test result or an MDI ordered.

## 2019-03-02 NOTE — ED Notes (Signed)
Pt O2 sat 100% on RA

## 2019-03-02 NOTE — MAU Note (Signed)
Cough, stuffy nose, headache, scratchy throat short of breath, no energy since Thursday. Had chills but doesn't had thermometer.  Today was worse.  Baby hasn't moved as much today.  No leaking. No bleeding.  Vomiting started yesterday when got to coughing bad.  Not much appetite. Has asthma, used inhaler and didn't help.
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
## 2019-03-03 ENCOUNTER — Telehealth: Payer: Self-pay

## 2019-03-03 NOTE — Telephone Encounter (Signed)
Received call from patient checking Covid results.  Advised results negative.   

## 2019-03-06 NOTE — Telephone Encounter (Signed)
Called pt to return VM and MyChart message. Pt requesting otc medication suggestion for congestion and cough. Pt reports a negative COVID test and states that she has been prescribed amoxicillin and prednisone during ED visit and continues to take as directed. Pt reports no improvement with Robitussin.  Recommended that pt try Mucinex plain or DM. Educated pt that she should avoid any medications with phenylephrine or pseudoephedrine. Discussed other strategies for reducing cough such as cough drops (alcohol free) and humidifier. Pt verbalizes understanding and states she will contact the office if she does not improve.

## 2019-03-18 ENCOUNTER — Encounter: Payer: Medicaid Other | Admitting: Obstetrics & Gynecology

## 2019-03-18 ENCOUNTER — Telehealth: Payer: Self-pay | Admitting: Obstetrics & Gynecology

## 2019-03-18 NOTE — Telephone Encounter (Signed)
Pt was unable to come to visit today with Dr. Roselie Awkward. Virtual visit offered by front office, pt refused.   Called pt to follow up about missed visit. Pt states she was unable to do a virtual appt today but is willing to schedule virtual appts for the future. Explained to pt she will need to come in person for 36 week visit, pt is agreeable. Pt states she skipped Makena injection last Tuesday because she only had one injection left. Asked pt to administer Makena injection now and call the compounding pharmacy to let them know she needs more injections by next Monday. Pt verbalized understanding of how to contact pharmacy. Pt states she missed [place] on 11/6 to reevaluate placenta previa. Explained to pt I will discuss with provider and contact her about plan of care.  Reviewed pt hx with Roselie Awkward, MD after phone call with pt was completed. Roselie Awkward, MD states pt needs to be rescheduled as soon as possible. [place] rescheduled with [facility]FM for 12/3 at 1345. Called pt to notify of appt time; VM left and MyChart message sent. Message sent to front office to reach out to pt to schedule future appts.

## 2019-03-18 NOTE — Telephone Encounter (Signed)
Caroline Ingram called and stataed she would not be able to make her appointment due to not having a way to get here. She also stated she didn't know if she would be able to come to anymore appointments because she wasn't sure if she would have a way to come. I offered her a virtual visit today so she could speak to Dr Roselie Awkward about it, and she stated she was not in a good place to have a virtual visit either. She seemed very upset.

## 2019-03-19 NOTE — Progress Notes (Signed)
Called [facility] @ [phone] for pt's refill on Makena.  Recevied notification from Estill Bamberg that Darol Destine should be shipped to pt's home address by Friday.  Pt advised via MyChart that if she has not received medication by Friday to please give [place] a call.

## 2019-03-20 NOTE — BH Specialist Note (Signed)
Integrated Behavioral Health Initial Visit  MRN: [id number] Name: Caroline Ingram  Number of [place] Clinician visits:: 1/6 Session Start time: 2:45  Session End time: 2:54 Total time: 15  Type of Service: [place] Interpretor:No. Interpretor Name and Language: n/a   Warm Hand Off Completed.       SUBJECTIVE: Caroline Ingram is a 33 y.o. female accompanied by n/a Patient was referred by Vivien Rota, MD for [facility] history. Patient reports the following symptoms/concerns: Pt states she was previously being seen by [facility], but missed last appointment and has not rescheduled, and thinking about going back to [facility] postpartum. Pt prefers to begin medication after baby is born [place]). Pt not taking iron prescribed because she "can't afford it".  Duration: Ongoing. Severity of problem: moderate  OBJECTIVE: Mood: Normal and Affect: Appropriate Risk of harm to self or others: No plan to harm self or others  LIFE CONTEXT: Family and Social: Pt lives with her children (88yo, 15yo) School/Work: - Self-Care: - Life Changes: Current pregnancy   GOALS ADDRESSED: Patient will: 1. Maintain reduction of  symptoms of: mood instability 2. Increase knowledge and/or ability of: healthy habits  3. Demonstrate ability to: Increase healthy adjustment to current life circumstances, Increase adequate support systems for patient/family and Increase motivation to adhere to plan of care  INTERVENTIONS: Interventions utilized: Psychoeducation and/or Health Education and Link to [employer]  Standardized Assessments completed: Did not give today  ASSESSMENT: Patient currently experiencing Bipolar 2 disorder, as previously diagnosed   Patient may benefit from psychoeducation and brief therapeutic interventions regarding maintaining reduction  of mood instability .  PLAN: 1. Follow up with behavioral health clinician on  : Two weeks 2. Behavioral recommendations:  -Continue taking prenatal vitamins as recommended -Continue to prioritize healthy eating and sleeping during remainder of pregnancy -Contact [facility] to re-establish care with psychiatry  -[place] [facility] hospital via [email or web address]  3. Referral(s): Integrated Orthoptist (In Clinic) and [employer] Resources:  [facility] 4. "From scale of 1-10, how likely are you to follow plan?": -  Caroline Fair, LCSW

## 2019-03-21 ENCOUNTER — Other Ambulatory Visit: Payer: Self-pay

## 2019-03-21 ENCOUNTER — Ambulatory Visit: Payer: Medicaid Other | Admitting: Clinical

## 2019-03-21 ENCOUNTER — Other Ambulatory Visit (HOSPITAL_COMMUNITY): Payer: Self-pay | Admitting: *Deleted

## 2019-03-21 ENCOUNTER — Other Ambulatory Visit (HOSPITAL_COMMUNITY): Payer: Self-pay | Admitting: Obstetrics

## 2019-03-21 ENCOUNTER — Ambulatory Visit (HOSPITAL_COMMUNITY)
Admission: RE | Admit: 2019-03-21 | Discharge: 2019-03-21 | Disposition: A | Discharge status: Home / Self Care | Payer: Medicaid Other | Source: Ambulatory Visit | Attending: Obstetrics | Admitting: Obstetrics

## 2019-03-21 ENCOUNTER — Ambulatory Visit (HOSPITAL_COMMUNITY): Payer: Medicaid Other | Admitting: *Deleted

## 2019-03-21 ENCOUNTER — Encounter (HOSPITAL_COMMUNITY): Payer: Self-pay

## 2019-03-21 DIAGNOSIS — O4400 Placenta previa specified as without hemorrhage, unspecified trimester: Secondary | ICD-10-CM

## 2019-03-21 DIAGNOSIS — O26892 Other specified pregnancy related conditions, second trimester: Secondary | ICD-10-CM | POA: Insufficient documentation

## 2019-03-21 DIAGNOSIS — O99213 Obesity complicating pregnancy, third trimester: Secondary | ICD-10-CM

## 2019-03-21 DIAGNOSIS — R102 Pelvic and perineal pain: Secondary | ICD-10-CM

## 2019-03-21 DIAGNOSIS — O09213 Supervision of pregnancy with history of pre-term labor, third trimester: Secondary | ICD-10-CM

## 2019-03-21 DIAGNOSIS — O09293 Supervision of pregnancy with other poor reproductive or obstetric history, third trimester: Secondary | ICD-10-CM

## 2019-03-21 DIAGNOSIS — O99333 Smoking (tobacco) complicating pregnancy, third trimester: Secondary | ICD-10-CM

## 2019-03-21 DIAGNOSIS — O4403 Placenta previa specified as without hemorrhage, third trimester: Secondary | ICD-10-CM | POA: Diagnosis not present

## 2019-03-21 DIAGNOSIS — O10013 Pre-existing essential hypertension complicating pregnancy, third trimester: Secondary | ICD-10-CM

## 2019-03-21 DIAGNOSIS — Z362 Encounter for other antenatal screening follow-up: Secondary | ICD-10-CM

## 2019-03-21 DIAGNOSIS — O4402 Placenta previa specified as without hemorrhage, second trimester: Secondary | ICD-10-CM | POA: Insufficient documentation

## 2019-03-21 DIAGNOSIS — Z3A32 32 weeks gestation of pregnancy: Secondary | ICD-10-CM

## 2019-03-21 DIAGNOSIS — O0990 Supervision of high risk pregnancy, unspecified, unspecified trimester: Secondary | ICD-10-CM

## 2019-03-21 DIAGNOSIS — O10919 Unspecified pre-existing hypertension complicating pregnancy, unspecified trimester: Secondary | ICD-10-CM

## 2019-03-21 DIAGNOSIS — F3181 Bipolar II disorder: Secondary | ICD-10-CM

## 2019-03-26 ENCOUNTER — Encounter: Payer: Self-pay | Admitting: *Deleted

## 2019-03-27 ENCOUNTER — Telehealth (INDEPENDENT_AMBULATORY_CARE_PROVIDER_SITE_OTHER): Payer: Medicaid Other | Admitting: Obstetrics & Gynecology

## 2019-03-27 DIAGNOSIS — O99210 Obesity complicating pregnancy, unspecified trimester: Secondary | ICD-10-CM

## 2019-03-27 DIAGNOSIS — O0990 Supervision of high risk pregnancy, unspecified, unspecified trimester: Secondary | ICD-10-CM

## 2019-03-27 DIAGNOSIS — R102 Pelvic and perineal pain: Secondary | ICD-10-CM

## 2019-03-27 DIAGNOSIS — Z3009 Encounter for other general counseling and advice on contraception: Secondary | ICD-10-CM

## 2019-03-27 DIAGNOSIS — O4402 Placenta previa specified as without hemorrhage, second trimester: Secondary | ICD-10-CM

## 2019-03-27 DIAGNOSIS — O4403 Placenta previa specified as without hemorrhage, third trimester: Secondary | ICD-10-CM

## 2019-03-27 DIAGNOSIS — O26893 Other specified pregnancy related conditions, third trimester: Secondary | ICD-10-CM

## 2019-03-27 DIAGNOSIS — Z3A33 33 weeks gestation of pregnancy: Secondary | ICD-10-CM

## 2019-03-27 DIAGNOSIS — O99213 Obesity complicating pregnancy, third trimester: Secondary | ICD-10-CM

## 2019-03-27 DIAGNOSIS — O0993 Supervision of high risk pregnancy, unspecified, third trimester: Secondary | ICD-10-CM

## 2019-03-27 DIAGNOSIS — O26892 Other specified pregnancy related conditions, second trimester: Secondary | ICD-10-CM

## 2019-03-27 NOTE — Progress Notes (Signed)
I connected with  [name] on 03/27/19 at 11:15 AM EST by telephone and verified that I am speaking with the correct person using two identifiers.   I discussed the limitations, risks, security and privacy concerns of performing an evaluation and management service by telephone and the availability of in person appointments. I also discussed with the patient that there may be a patient responsible charge related to this service. The patient expressed understanding and agreed to proceed.  Verdell Carmine, RN 03/27/2019  11:02 AM

## 2019-03-27 NOTE — Progress Notes (Signed)
TELEHEALTH OBSTETRICS PRENATAL VIRTUAL VIDEO VISIT ENCOUNTER NOTE  Provider location: Center for [facility] [place]   I connected with Caroline Ingram on 03/27/19 at 11:15 AM EST by MyChart Video Encounter at home and verified that I am speaking with the correct person using two identifiers.   I discussed the limitations, risks, security and privacy concerns of performing an evaluation and management service virtually and the availability of in person appointments. I also discussed with the patient that there may be a patient responsible charge related to this service. The patient expressed understanding and agreed to proceed. Subjective:  Caroline Ingram is a 33 y.o. [id number] at [redacted]w[redacted]d being seen today for ongoing prenatal care.  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  Patient reports no complaints.  Contractions: Irritability. Vag. Bleeding: None.  Movement: Present. Denies any leaking of fluid.   The following portions of the patient's history were reviewed and updated as appropriate: allergies, current medications, past family history, past medical history, past social history, past surgical history and problem list.   Objective:  There were no vitals filed for this visit.  Fetal Status:     Movement: Present     General:  Alert, oriented  and cooperative. Patient is in no acute distress.  Respiratory: Normal respiratory effort, no problems with respiration noted  Mental Status: Normal mood and affect. Normal behavior. Normal judgment and thought content.  Rest of physical exam deferred due to type of encounter  Imaging: [place] Mfm Ob Transvaginal  Result Date: 03/21/2019 ----------------------------------------------------------------------  OBSTETRICS REPORT                       (Signed Final 03/21/2019 04:59 pm) ---------------------------------------------------------------------- Patient Info  ID #:       [id number]                          D.O.B.:  1985-08-14 (33 yrs)  Name:       Caroline Ingram               Visit Date: 03/21/2019 01:46 pm ---------------------------------------------------------------------- Performed By  Performed By:     Lenise Arena        Ref. Address:     [address]                    [place]                                                             [address]  Attending:        Noralee Space MD        [facility]  Referred By:      [facility] ---------------------------------------------------------------------- Orders   #  Description                          Code         Ordered By   1  [place] MFM OB FOLLOW UP                  76816.01     YU FANG   2  [place] MFM OB TRANSVAGINAL               [id number]      YU FANG  ----------------------------------------------------------------------   #  Order #                    Accession #                 Episode #   1  [id number]                  [id number]                  [id number]   2  [id number]                  [id number]                  [id number]  ---------------------------------------------------------------------- Indications   Placenta previa specified as without           O44.02   hemorrhage, second trimester   Tobacco use complicating pregnancy,            O99.332   second  trimester   Encounter for other antenatal screening        Z36.2   follow-up (neg AFP, FTS)   Poor obstetric history: Previous fetal growth  O09.299   restriction (FGR) x 2   Poor obstetric history: Previous preterm       O09.219   delivery, antepartum (36 weeks x 2)   Hypertension - Chronic/Pre-existing (no        O10.019   meds)   Obesity complicating pregnancy, second         O99.212   trimester   Marijuana Use   [redacted] weeks gestation of pregnancy                Z3A.32  ---------------------------------------------------------------------- Vital Signs  Weight (lb): 195                               Height:        5'3"  BMI:         34.54 ---------------------------------------------------------------------- Fetal Evaluation  Num Of Fetuses:         1  Fetal Heart Rate(bpm):  137  Cardiac Activity:       Observed  Presentation:           Cephalic  Placenta:               Low-lying, 5 mm from the int os  P. Cord Insertion:      Previously Visualized  Amniotic Fluid  AFI FV:      Within normal limits  AFI Sum(cm)     %Tile       Largest Pocket(cm)  9.67            14  3.48  RUQ(cm)       RLQ(cm)       LUQ(cm)        LLQ(cm)  0             3.03          3.48           3.16 ---------------------------------------------------------------------- Biometry  BPD:      73.9  mm     G. Age:  29w 5d        < 1  %    CI:        75.84   %    70 - 86                                                          FL/HC:      23.2   %    19.1 - 21.3  HC:       269   mm     G. Age:  29w 2d        < 1  %    HC/AC:      0.95        0.96 - 1.17  AC:      283.1  mm     G. Age:  32w 2d         47  %    FL/BPD:     84.6   %    71 - 87  FL:       62.5  mm     G. Age:  32w 3d         35  %    FL/AC:      22.1   %    20 - 24  Est. FW:    1838  gm      4 lb 1 oz     22  % ---------------------------------------------------------------------- OB History  Gravidity:    3         Term:   0        Prem:   2        SAB:   0  TOP:          0        Ectopic:  0        Living: 2 ---------------------------------------------------------------------- Gestational Age  LMP:           36w 1d        Date:  07/11/18                 EDD:   04/17/19  U/S Today:     31w 0d                                        EDD:   05/23/19  Best:          32w 3d     Det. ByMarcella Dubs:  Early Ultrasound         EDD:   05/13/19                                      (  09/21/18) ---------------------------------------------------------------------- Anatomy  Cranium:               Appears normal         LVOT:                   Appears normal  Cavum:                 Appears normal         Aortic Arch:            Previously seen  Ventricles:            Appears normal         Ductal Arch:            Previously seen  Choroid Plexus:        Previously seen        Diaphragm:              Appears normal  Cerebellum:            Previously seen        Stomach:                Appears normal, left                                                                        sided  Posterior Fossa:       Previously seen        Abdomen:                Appears normal  Nuchal Fold:           Not applicable (>20    Abdominal Wall:         Previously seen                         wks GA)  Face:                  Orbits and profile     Cord Vessels:           Previously seen                         previously seen  Lips:                  Previously seen        Kidneys:                Appear normal  Palate:                Previously seen        Bladder:                Appears normal  Thoracic:              Appears normal         Spine:                  Previously seen  Heart:                 Appears normal  Upper Extremities:      Previously seen                         (4CH, axis, and                         situs)  RVOT:                  Appears normal         Lower Extremities:      Previously seen  Other:  Female gender. Technically difficult due to maternal habitus and fetal          position.  ---------------------------------------------------------------------- Cervix Uterus Adnexa  Cervix  Length:            3.2  cm.  Not visualized (advanced GA >24wks) ---------------------------------------------------------------------- Impression  Chronic hypertension.  Well controlled without  antihypertensives.  Placenta previa.  Patient does not have symptoms of vaginal  bleeding.  On ultrasound, amniotic fluid is normal good fetal activity  seen.  Fetal growth is appropriate for gestational age.  We performed transvaginal ultrasound to evaluate the  placenta.  The cervix measures 3.2 cm.  The placenta is  posterior and the placental age is about 5 millimeters from  the internal os, which is consistent with low-lying placenta.  I explained the findings and informed her that with advancing  gestation low-lying placenta can resolve in some cases.  Patient does not have symptoms of vaginal bleeding.  I  advised her to abstain from sexual intercourse.  If low-lying placenta resolves or if it is more than 1 cm from  the internal loss, vaginal delivery is possible. ---------------------------------------------------------------------- Recommendations  -An appointment was made for her to return in 3 weeks for  transvaginal assessment of placental position. ----------------------------------------------------------------------                  Noralee Space, MD Electronically Signed Final Report   03/21/2019 04:59 pm ----------------------------------------------------------------------  [place] Mfm Ob Follow Up  Result Date: 03/21/2019 ----------------------------------------------------------------------  OBSTETRICS REPORT                       (Signed Final 03/21/2019 04:59 pm) ---------------------------------------------------------------------- Patient Info  ID #:       [id number]                          D.O.B.:  [birth year] (33 yrs)  Name:       Caroline Ingram               Visit Date: 03/21/2019 01:46 pm  ---------------------------------------------------------------------- Performed By  Performed By:     Lenise Arena        Ref. Address:     [address]                    [place]                                                             [address]  Attending:        Noralee Space MD        [facility]  Referred By:      [facility] ---------------------------------------------------------------------- Orders   #  Description                          Code         Ordered By   1  [place] MFM OB FOLLOW UP                  76816.01     YU FANG   2  [place] MFM OB TRANSVAGINAL               [id number]      YU FANG  ----------------------------------------------------------------------   #  Order #                    Accession #                 Episode #   1  [id number]                  [id number]                  [id number]   2  [id number]                  [id number]                  [id number]  ---------------------------------------------------------------------- Indications   Placenta previa specified as without           O44.02   hemorrhage, second trimester   Tobacco use complicating pregnancy,            O99.332   second trimester   Encounter for other antenatal screening        Z36.2   follow-up (neg AFP, FTS)   Poor obstetric history: Previous fetal growth  O09.299   restriction (FGR) x 2   Poor obstetric history: Previous preterm       O09.219   delivery, antepartum (36 weeks x 2)   Hypertension - Chronic/Pre-existing (no        O10.019   meds)   Obesity complicating pregnancy, second         O99.212   trimester   Marijuana Use   [redacted] weeks gestation of pregnancy                Z3A.32  ---------------------------------------------------------------------- Vital Signs  Weight (lb): 195                               Height:        5'3"  BMI:         34.54 ---------------------------------------------------------------------- Fetal  Evaluation  Num Of Fetuses:         1  Fetal Heart Rate(bpm):  137  Cardiac Activity:       Observed  Presentation:           Cephalic  Placenta:               Low-lying,  5 mm from the int os  P. Cord Insertion:      Previously Visualized  Amniotic Fluid  AFI FV:      Within normal limits  AFI Sum(cm)     %Tile       Largest Pocket(cm)  9.67            14          3.48  RUQ(cm)       RLQ(cm)       LUQ(cm)        LLQ(cm)  0             3.03          3.48           3.16 ---------------------------------------------------------------------- Biometry  BPD:      73.9  mm     G. Age:  29w 5d        < 1  %    CI:        75.84   %    70 - 86                                                          FL/HC:      23.2   %    19.1 - 21.3  HC:       269   mm     G. Age:  29w 2d        < 1  %    HC/AC:      0.95        0.96 - 1.17  AC:      283.1  mm     G. Age:  32w 2d         47  %    FL/BPD:     84.6   %    71 - 87  FL:       62.5  mm     G. Age:  32w 3d         35  %    FL/AC:      22.1   %    20 - 24  Est. FW:    1838  gm      4 lb 1 oz     22  % ---------------------------------------------------------------------- OB History  Gravidity:    3         Term:   0        Prem:   2        SAB:   0  TOP:          0       Ectopic:  0        Living: 2 ---------------------------------------------------------------------- Gestational Age  LMP:           36w 1d        Date:  07/11/18                 EDD:   04/17/19  U/S Today:     31w 0d  EDD:   05/23/19  Best:          Armida Sans 3d     Det. ByMarcella Dubs         EDD:   05/13/19                                      (09/21/18) ---------------------------------------------------------------------- Anatomy  Cranium:               Appears normal         LVOT:                   Appears normal  Cavum:                 Appears normal         Aortic Arch:            Previously seen  Ventricles:            Appears normal         Ductal Arch:             Previously seen  Choroid Plexus:        Previously seen        Diaphragm:              Appears normal  Cerebellum:            Previously seen        Stomach:                Appears normal, left                                                                        sided  Posterior Fossa:       Previously seen        Abdomen:                Appears normal  Nuchal Fold:           Not applicable (>20    Abdominal Wall:         Previously seen                         wks GA)  Face:                  Orbits and profile     Cord Vessels:           Previously seen                         previously seen  Lips:                  Previously seen        Kidneys:                Appear normal  Palate:                Previously seen        Bladder:  Appears normal  Thoracic:              Appears normal         Spine:                  Previously seen  Heart:                 Appears normal         Upper Extremities:      Previously seen                         (4CH, axis, and                         situs)  RVOT:                  Appears normal         Lower Extremities:      Previously seen  Other:  Female gender. Technically difficult due to maternal habitus and fetal          position. ---------------------------------------------------------------------- Cervix Uterus Adnexa  Cervix  Length:            3.2  cm.  Not visualized (advanced GA >24wks) ---------------------------------------------------------------------- Impression  Chronic hypertension.  Well controlled without  antihypertensives.  Placenta previa.  Patient does not have symptoms of vaginal  bleeding.  On ultrasound, amniotic fluid is normal good fetal activity  seen.  Fetal growth is appropriate for gestational age.  We performed transvaginal ultrasound to evaluate the  placenta.  The cervix measures 3.2 cm.  The placenta is  posterior and the placental age is about 5 millimeters from  the internal os, which is consistent with low-lying placenta.  I  explained the findings and informed her that with advancing  gestation low-lying placenta can resolve in some cases.  Patient does not have symptoms of vaginal bleeding.  I  advised her to abstain from sexual intercourse.  If low-lying placenta resolves or if it is more than 1 cm from  the internal loss, vaginal delivery is possible. ---------------------------------------------------------------------- Recommendations  -An appointment was made for her to return in 3 weeks for  transvaginal assessment of placental position. ----------------------------------------------------------------------                  Tama High, MD Electronically Signed Final Report   03/21/2019 04:59 pm ----------------------------------------------------------------------   Assessment and Plan:  Pregnancy: [id number] at [redacted]w[redacted]d 1. Pelvic pain affecting pregnancy in second trimester, antepartum  2. Placenta previa antepartum in second trimester - now 5 mm away from os, she is doing pelvic rest  3. Supervision of high risk pregnancy, antepartum - will need vaccines at next visit  4. Obesity in pregnancy 5. Unwanted fertility- will need to sign Medicaid forms at next visit   Preterm labor symptoms and general obstetric precautions including but not limited to vaginal bleeding, contractions, leaking of fluid and fetal movement were reviewed in detail with the patient. I discussed the assessment and treatment plan with the patient. The patient was provided an opportunity to ask questions and all were answered. The patient agreed with the plan and demonstrated an understanding of the instructions. The patient was advised to call back or seek an in-person office evaluation/go to MAU at [facility] for any urgent or concerning symptoms. Please refer to After Visit Summary for other counseling recommendations.   I  provided 10 minutes of face-to-face time during this encounter.  Return in about 1 week (around  04/03/2019) for in person, sign tubal forms and get vaccines.  Future Appointments  Date Time Provider Department Center  04/04/2019 10:45 AM [facility]INIC[facility] WOC-WOCA WOC  04/10/2019  8:15 AM Menifee Bing, MD WOC-WOCA WOC  04/10/2019  1:45 PM WH-MFC [place] 2 WH-MFCUS MFC-US  04/10/2019  1:50 PM WH-MFC NURSE WH-MFC MFC-US  04/15/2019 11:15 AM Reva Bores, MD WOC-WOCA WOC    Allie Bossier, MD Center for [facility]

## 2019-03-28 NOTE — BH Specialist Note (Signed)
Pt did not arrive to video visit and did not answer the phone ; Left HIPPA-compliant message to call back [name] from [facility] at [phone].  ; left MyChart message for patient.   [place] via Telemedicine Video Visit  03/28/2019 LEEANNE BUTTERS [phone]  Garlan Fair

## 2019-04-03 ENCOUNTER — Ambulatory Visit: Payer: Medicaid Other

## 2019-04-04 ENCOUNTER — Ambulatory Visit: Payer: Medicaid Other | Admitting: Clinical

## 2019-04-04 DIAGNOSIS — Z91199 Patient's noncompliance with other medical treatment and regimen due to unspecified reason: Secondary | ICD-10-CM

## 2019-04-04 DIAGNOSIS — Z5329 Procedure and treatment not carried out because of patient's decision for other reasons: Secondary | ICD-10-CM

## 2019-04-10 ENCOUNTER — Other Ambulatory Visit (HOSPITAL_COMMUNITY): Payer: Self-pay | Admitting: Obstetrics and Gynecology

## 2019-04-10 ENCOUNTER — Encounter (HOSPITAL_COMMUNITY): Payer: Self-pay

## 2019-04-10 ENCOUNTER — Encounter: Payer: Self-pay | Admitting: Obstetrics and Gynecology

## 2019-04-10 ENCOUNTER — Telehealth (INDEPENDENT_AMBULATORY_CARE_PROVIDER_SITE_OTHER): Payer: Medicaid Other | Admitting: Obstetrics and Gynecology

## 2019-04-10 ENCOUNTER — Ambulatory Visit (HOSPITAL_COMMUNITY): Payer: Medicaid Other | Admitting: *Deleted

## 2019-04-10 ENCOUNTER — Ambulatory Visit (HOSPITAL_COMMUNITY)
Admission: RE | Admit: 2019-04-10 | Discharge: 2019-04-10 | Disposition: A | Discharge status: Home / Self Care | Payer: Medicaid Other | Source: Ambulatory Visit | Attending: Obstetrics and Gynecology | Admitting: Obstetrics and Gynecology

## 2019-04-10 ENCOUNTER — Other Ambulatory Visit: Payer: Self-pay

## 2019-04-10 ENCOUNTER — Other Ambulatory Visit (HOSPITAL_COMMUNITY): Payer: Self-pay | Admitting: *Deleted

## 2019-04-10 DIAGNOSIS — Z3A35 35 weeks gestation of pregnancy: Secondary | ICD-10-CM

## 2019-04-10 DIAGNOSIS — O0990 Supervision of high risk pregnancy, unspecified, unspecified trimester: Secondary | ICD-10-CM

## 2019-04-10 DIAGNOSIS — Z91199 Patient's noncompliance with other medical treatment and regimen due to unspecified reason: Secondary | ICD-10-CM | POA: Insufficient documentation

## 2019-04-10 DIAGNOSIS — O09893 Supervision of other high risk pregnancies, third trimester: Secondary | ICD-10-CM

## 2019-04-10 DIAGNOSIS — O4403 Placenta previa specified as without hemorrhage, third trimester: Secondary | ICD-10-CM | POA: Diagnosis not present

## 2019-04-10 DIAGNOSIS — R102 Pelvic and perineal pain: Secondary | ICD-10-CM | POA: Diagnosis present

## 2019-04-10 DIAGNOSIS — O10919 Unspecified pre-existing hypertension complicating pregnancy, unspecified trimester: Secondary | ICD-10-CM

## 2019-04-10 DIAGNOSIS — O365930 Maternal care for other known or suspected poor fetal growth, third trimester, not applicable or unspecified: Secondary | ICD-10-CM | POA: Insufficient documentation

## 2019-04-10 DIAGNOSIS — O09213 Supervision of pregnancy with history of pre-term labor, third trimester: Secondary | ICD-10-CM | POA: Diagnosis not present

## 2019-04-10 DIAGNOSIS — Z975 Presence of (intrauterine) contraceptive device: Secondary | ICD-10-CM

## 2019-04-10 DIAGNOSIS — O09293 Supervision of pregnancy with other poor reproductive or obstetric history, third trimester: Secondary | ICD-10-CM | POA: Diagnosis not present

## 2019-04-10 DIAGNOSIS — O26892 Other specified pregnancy related conditions, second trimester: Secondary | ICD-10-CM | POA: Diagnosis present

## 2019-04-10 DIAGNOSIS — O4402 Placenta previa specified as without hemorrhage, second trimester: Secondary | ICD-10-CM

## 2019-04-10 DIAGNOSIS — O99333 Smoking (tobacco) complicating pregnancy, third trimester: Secondary | ICD-10-CM | POA: Diagnosis not present

## 2019-04-10 DIAGNOSIS — O4400 Placenta previa specified as without hemorrhage, unspecified trimester: Secondary | ICD-10-CM | POA: Diagnosis present

## 2019-04-10 DIAGNOSIS — Z362 Encounter for other antenatal screening follow-up: Secondary | ICD-10-CM | POA: Diagnosis not present

## 2019-04-10 DIAGNOSIS — Z658 Other specified problems related to psychosocial circumstances: Secondary | ICD-10-CM | POA: Insufficient documentation

## 2019-04-10 DIAGNOSIS — O365990 Maternal care for other known or suspected poor fetal growth, unspecified trimester, not applicable or unspecified: Secondary | ICD-10-CM

## 2019-04-10 DIAGNOSIS — O99019 Anemia complicating pregnancy, unspecified trimester: Secondary | ICD-10-CM

## 2019-04-10 DIAGNOSIS — O10013 Pre-existing essential hypertension complicating pregnancy, third trimester: Secondary | ICD-10-CM

## 2019-04-10 DIAGNOSIS — O0993 Supervision of high risk pregnancy, unspecified, third trimester: Secondary | ICD-10-CM

## 2019-04-10 DIAGNOSIS — O10913 Unspecified pre-existing hypertension complicating pregnancy, third trimester: Secondary | ICD-10-CM | POA: Diagnosis not present

## 2019-04-10 DIAGNOSIS — Z9119 Patient's noncompliance with other medical treatment and regimen: Secondary | ICD-10-CM | POA: Insufficient documentation

## 2019-04-10 DIAGNOSIS — O99013 Anemia complicating pregnancy, third trimester: Secondary | ICD-10-CM

## 2019-04-10 DIAGNOSIS — O99213 Obesity complicating pregnancy, third trimester: Secondary | ICD-10-CM

## 2019-04-10 DIAGNOSIS — O09899 Supervision of other high risk pregnancies, unspecified trimester: Secondary | ICD-10-CM

## 2019-04-10 DIAGNOSIS — O26893 Other specified pregnancy related conditions, third trimester: Secondary | ICD-10-CM | POA: Diagnosis not present

## 2019-04-10 DIAGNOSIS — F3181 Bipolar II disorder: Secondary | ICD-10-CM

## 2019-04-10 DIAGNOSIS — O99343 Other mental disorders complicating pregnancy, third trimester: Secondary | ICD-10-CM

## 2019-04-10 NOTE — Progress Notes (Signed)
I connected with  [name] on 04/10/19 at  8:15 AM EST by telephone and verified that I am speaking with the correct person using two identifiers.   I discussed the limitations, risks, security and privacy concerns of performing an evaluation and management service by telephone and the availability of in person appointments. I also discussed with the patient that there may be a patient responsible charge related to this service. The patient expressed understanding and agreed to proceed.  Alric Seton, Caroline 04/10/2019  8:21 AM   Patient stated that she is having issues with her spouse and doesn't havre any of her supplies like [employer]PC[employer] or makena where she is currently located.

## 2019-04-10 NOTE — Progress Notes (Addendum)
Pt here in front office following virtual appt this AM to sign BTL consent; BTL consent signed.  Apolonio Schneiders RN 04/10/19

## 2019-04-10 NOTE — Progress Notes (Signed)
TELEHEALTH VIRTUAL OBSTETRICS VISIT ENCOUNTER NOTE  Clinic: [facility]  I connected with [name] on 04/10/19 at  8:15 AM EST by telephone at home and verified that I am speaking with the correct person using two identifiers.   I discussed the limitations, risks, security and privacy concerns of performing an evaluation and management service by telephone and the availability of in person appointments. I also discussed with the patient that there may be a patient responsible charge related to this service. The patient expressed understanding and agreed to proceed.  Subjective:  Caroline Ingram is a 33 y.o. [id number] at [redacted]w[redacted]d being followed for ongoing prenatal care.  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  Patient reports occasional cramping. Reports fetal movement. Denies any contractions, bleeding or leaking of fluid.   The following portions of the patient's history were reviewed and updated as appropriate: allergies, current medications, past family history, past medical history, past social history, past surgical history and problem list.   Objective:  There were no vitals filed for this visit.  Babyscripts Data Reviewed: not applicable  General:  Alert, oriented and  cooperative.   Mental Status: Normal mood and affect perceived. Normal judgment and thought content.  Rest of physical exam deferred due to type of encounter  Assessment and Plan:  Pregnancy: [id number] at [redacted]w[redacted]d 1. Pelvic cramping affecting pregnancy in second trimester, antepartum Pt told to let us know if it continues when she comes in for ultrastound this afternoon  2. Placenta previa antepartum in second trimester F/u ultrasound for this afternoon. Low lying at last one. D/w her that likely will determine delivery timing and route  3. Supervision of high risk pregnancy, antepartum Has never signed BTL papers. Pt told to please tell front desk when she checks in today that she needs to sign. [place] desk aware  4. Chronic hypertension affecting pregnancy See below Pt unsure what type of meds she's on and I don't see any in the [facility], problem list or in prior OB notes Follow up bp today when she comes in for ultrasound Pt states she never took the aspirin  5. Antepartum anemia Pt has refills for iron. Pt told to go by pharmacy and get meds and if she ever runs out to let us know and we can send in more  6. Bipolar 2 disorder, major depressive episode ([place]) On on nothing  7. History of preterm delivery, currently pregnant Last took home 17p about a week ago. See below. We don't have any in the office and she's 35wks. I told her at this point we couldn't likely get any in before she would last need it; she only has two more doses left  8. Nexplanon in place Needs pp GSU consult. See 7/23 note  9. Patient noncompliance  10. Social discord Pt states she is living in a safe place. See CMA note  Preterm labor symptoms and general obstetric precautions including but not limited to  vaginal bleeding, contractions, leaking of fluid and fetal movement were reviewed in detail with the patient.  I discussed the assessment and treatment plan with the patient. The patient was provided an  opportunity to ask questions and all were answered. The patient agreed with the plan and demonstrated an understanding of the instructions. The patient was advised to call back or seek an in-person office evaluation/go to [facility]U at [facility] for any urgent or concerning symptoms. Please refer to After Visit Summary for other counseling recommendations.   I provided 10 minutes of non-face-to-face time during this encounter. The visit was conducted via MyChart-medicine  Return in about 1 week (around 04/17/2019) for in person, high risk.  Future Appointments  Date Time Provider Department Center  04/10/2019  1:45 PM WH-MFC [place] 2 WH-MFCUS MFC-US  04/10/2019  1:50 PM WH-MFC NURSE WH-MFC MFC-US  04/15/2019 11:15 AM Reva Bores, MD [facility] WOC    [place] Bing, MD Center for [facility], [facility]

## 2019-04-10 NOTE — Progress Notes (Signed)
8:10a- Called pt for My Chart visit, no answer, left VM that will call back in 10 to 15 min.

## 2019-04-15 ENCOUNTER — Other Ambulatory Visit: Payer: Self-pay

## 2019-04-15 ENCOUNTER — Other Ambulatory Visit (HOSPITAL_COMMUNITY)
Admission: RE | Admit: 2019-04-15 | Discharge: 2019-04-15 | Disposition: A | Discharge status: Home / Self Care | Payer: Medicaid Other | Source: Ambulatory Visit | Attending: Family Medicine | Admitting: Family Medicine

## 2019-04-15 ENCOUNTER — Encounter: Payer: Self-pay | Admitting: *Deleted

## 2019-04-15 ENCOUNTER — Ambulatory Visit (INDEPENDENT_AMBULATORY_CARE_PROVIDER_SITE_OTHER): Payer: Medicaid Other | Admitting: Family Medicine

## 2019-04-15 ENCOUNTER — Encounter: Payer: Self-pay | Admitting: Family Medicine

## 2019-04-15 VITALS — BP 110/70 | HR 73 | Wt 196.0 lb

## 2019-04-15 DIAGNOSIS — Z23 Encounter for immunization: Secondary | ICD-10-CM

## 2019-04-15 DIAGNOSIS — O0990 Supervision of high risk pregnancy, unspecified, unspecified trimester: Secondary | ICD-10-CM | POA: Insufficient documentation

## 2019-04-15 DIAGNOSIS — O10913 Unspecified pre-existing hypertension complicating pregnancy, third trimester: Secondary | ICD-10-CM

## 2019-04-15 DIAGNOSIS — O99013 Anemia complicating pregnancy, third trimester: Secondary | ICD-10-CM

## 2019-04-15 DIAGNOSIS — O0993 Supervision of high risk pregnancy, unspecified, third trimester: Secondary | ICD-10-CM

## 2019-04-15 DIAGNOSIS — O10919 Unspecified pre-existing hypertension complicating pregnancy, unspecified trimester: Secondary | ICD-10-CM

## 2019-04-15 DIAGNOSIS — Z3A36 36 weeks gestation of pregnancy: Secondary | ICD-10-CM

## 2019-04-15 DIAGNOSIS — O09893 Supervision of other high risk pregnancies, third trimester: Secondary | ICD-10-CM | POA: Diagnosis not present

## 2019-04-15 DIAGNOSIS — O99019 Anemia complicating pregnancy, unspecified trimester: Secondary | ICD-10-CM

## 2019-04-15 DIAGNOSIS — O09899 Supervision of other high risk pregnancies, unspecified trimester: Secondary | ICD-10-CM

## 2019-04-15 NOTE — Patient Instructions (Signed)

## 2019-04-15 NOTE — Progress Notes (Signed)
BTL paperwork signed today  Mel Almond, RN 04/15/19
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
## 2019-04-16 LAB — GC/CHLAMYDIA PROBE AMP (~~LOC~~) NOT AT ARMC
Chlamydia: NEGATIVE
Comment: NEGATIVE
Comment: NORMAL
Neisseria Gonorrhea: NEGATIVE

## 2019-04-17 ENCOUNTER — Other Ambulatory Visit (HOSPITAL_COMMUNITY): Payer: Medicaid Other

## 2019-04-19 LAB — CULTURE, BETA STREP (GROUP B ONLY): Strep Gp B Culture: NEGATIVE

## 2019-04-19 NOTE — L&D Delivery Note (Signed)
  Delivery Note After about a 10 minute 2nd stage, at 9:37 PM a viable female was delivered via Vaginal, Spontaneous (Presentation: Left Occiput  ).  APGAR: 9, 9; weight pending. After 1 minute, the cord was clamped and cut. 40 units of pitocin diluted in 1000cc LR was infused rapidly IV.  The placenta separated spontaneously and delivered via CCT and maternal pushing effort.  It was inspected and appears to be intact with a 3 VC.  Pt kept oozing blood, so cytotec 800 mcg was given PR  Bleeding normalized shortly afterwards.   Anesthesia: Epidural Episiotomy: None Lacerations: None Suture Repair:  Est. Blood Loss (mL): 245  Mom to postpartum.  Baby to Couplet care / Skin to Skin.  Caroline Ingram 04/25/2019, 10:01 PM

## 2019-04-22 ENCOUNTER — Other Ambulatory Visit: Payer: Self-pay

## 2019-04-22 ENCOUNTER — Other Ambulatory Visit: Payer: Medicaid Other

## 2019-04-22 DIAGNOSIS — O0990 Supervision of high risk pregnancy, unspecified, unspecified trimester: Secondary | ICD-10-CM

## 2019-04-23 LAB — GLUCOSE TOLERANCE, 2 HOURS W/ 1HR
Glucose, 1 hour: 113 mg/dL (ref 65–179)
Glucose, 2 hour: 81 mg/dL (ref 65–152)
Glucose, Fasting: 71 mg/dL (ref 65–91)

## 2019-04-23 LAB — CBC
Hematocrit: 34.7 % (ref 34.0–46.6)
Hemoglobin: 10.7 g/dL — ABNORMAL LOW (ref 11.1–15.9)
MCH: 25.7 pg — ABNORMAL LOW (ref 26.6–33.0)
MCHC: 30.8 g/dL — ABNORMAL LOW (ref 31.5–35.7)
MCV: 83 fL (ref 79–97)
Platelets: 246 10*3/uL (ref 150–450)
RBC: 4.17 x10E6/uL (ref 3.77–5.28)
RDW: 14.6 % (ref 11.7–15.4)
WBC: 6.4 10*3/uL (ref 3.4–10.8)

## 2019-04-23 LAB — HIV ANTIBODY (ROUTINE TESTING W REFLEX): HIV Screen 4th Generation wRfx: NONREACTIVE

## 2019-04-23 LAB — RPR: RPR Ser Ql: NONREACTIVE

## 2019-04-25 ENCOUNTER — Inpatient Hospital Stay (HOSPITAL_COMMUNITY)
Admission: AD | Admit: 2019-04-25 | Discharge: 2019-04-27 | DRG: 806 | Disposition: A | Discharge status: Home / Self Care | Payer: Medicaid Other | Attending: Obstetrics and Gynecology | Admitting: Obstetrics and Gynecology

## 2019-04-25 ENCOUNTER — Inpatient Hospital Stay (HOSPITAL_COMMUNITY): Payer: Medicaid Other | Admitting: Anesthesiology

## 2019-04-25 ENCOUNTER — Other Ambulatory Visit: Payer: Self-pay

## 2019-04-25 ENCOUNTER — Encounter (HOSPITAL_COMMUNITY): Payer: Self-pay | Admitting: Family Medicine

## 2019-04-25 DIAGNOSIS — F3181 Bipolar II disorder: Secondary | ICD-10-CM | POA: Diagnosis present

## 2019-04-25 DIAGNOSIS — O99334 Smoking (tobacco) complicating childbirth: Secondary | ICD-10-CM | POA: Diagnosis present

## 2019-04-25 DIAGNOSIS — O4402 Placenta previa specified as without hemorrhage, second trimester: Secondary | ICD-10-CM

## 2019-04-25 DIAGNOSIS — O99214 Obesity complicating childbirth: Secondary | ICD-10-CM | POA: Diagnosis present

## 2019-04-25 DIAGNOSIS — O26892 Other specified pregnancy related conditions, second trimester: Secondary | ICD-10-CM

## 2019-04-25 DIAGNOSIS — O10919 Unspecified pre-existing hypertension complicating pregnancy, unspecified trimester: Secondary | ICD-10-CM | POA: Diagnosis present

## 2019-04-25 DIAGNOSIS — F17200 Nicotine dependence, unspecified, uncomplicated: Secondary | ICD-10-CM | POA: Diagnosis present

## 2019-04-25 DIAGNOSIS — O09899 Supervision of other high risk pregnancies, unspecified trimester: Secondary | ICD-10-CM

## 2019-04-25 DIAGNOSIS — Z3A37 37 weeks gestation of pregnancy: Secondary | ICD-10-CM

## 2019-04-25 DIAGNOSIS — O99210 Obesity complicating pregnancy, unspecified trimester: Secondary | ICD-10-CM | POA: Diagnosis present

## 2019-04-25 DIAGNOSIS — O0990 Supervision of high risk pregnancy, unspecified, unspecified trimester: Secondary | ICD-10-CM

## 2019-04-25 DIAGNOSIS — Z720 Tobacco use: Secondary | ICD-10-CM | POA: Diagnosis present

## 2019-04-25 DIAGNOSIS — O99344 Other mental disorders complicating childbirth: Secondary | ICD-10-CM | POA: Diagnosis present

## 2019-04-25 DIAGNOSIS — F17210 Nicotine dependence, cigarettes, uncomplicated: Secondary | ICD-10-CM | POA: Diagnosis present

## 2019-04-25 DIAGNOSIS — Z975 Presence of (intrauterine) contraceptive device: Secondary | ICD-10-CM

## 2019-04-25 DIAGNOSIS — J45909 Unspecified asthma, uncomplicated: Secondary | ICD-10-CM | POA: Diagnosis present

## 2019-04-25 DIAGNOSIS — E669 Obesity, unspecified: Secondary | ICD-10-CM | POA: Diagnosis present

## 2019-04-25 DIAGNOSIS — Z20822 Contact with and (suspected) exposure to covid-19: Secondary | ICD-10-CM | POA: Diagnosis present

## 2019-04-25 DIAGNOSIS — R102 Pelvic and perineal pain: Secondary | ICD-10-CM

## 2019-04-25 DIAGNOSIS — O1002 Pre-existing essential hypertension complicating childbirth: Secondary | ICD-10-CM | POA: Diagnosis present

## 2019-04-25 DIAGNOSIS — Z23 Encounter for immunization: Secondary | ICD-10-CM | POA: Diagnosis not present

## 2019-04-25 DIAGNOSIS — O26893 Other specified pregnancy related conditions, third trimester: Secondary | ICD-10-CM | POA: Diagnosis present

## 2019-04-25 DIAGNOSIS — O9952 Diseases of the respiratory system complicating childbirth: Secondary | ICD-10-CM | POA: Diagnosis present

## 2019-04-25 DIAGNOSIS — Z3009 Encounter for other general counseling and advice on contraception: Secondary | ICD-10-CM | POA: Diagnosis present

## 2019-04-25 HISTORY — DX: Palpitations: R00.2

## 2019-04-25 LAB — RESPIRATORY PANEL BY RT PCR (FLU A&B, COVID)
Influenza A by PCR: NEGATIVE
Influenza B by PCR: NEGATIVE
SARS Coronavirus 2 by RT PCR: NEGATIVE

## 2019-04-25 LAB — CBC
HCT: 34.9 % — ABNORMAL LOW (ref 36.0–46.0)
Hemoglobin: 11.1 g/dL — ABNORMAL LOW (ref 12.0–15.0)
MCH: 26.2 pg (ref 26.0–34.0)
MCHC: 31.8 g/dL (ref 30.0–36.0)
MCV: 82.3 fL (ref 80.0–100.0)
Platelets: 244 10*3/uL (ref 150–400)
RBC: 4.24 MIL/uL (ref 3.87–5.11)
RDW: 15 % (ref 11.5–15.5)
WBC: 6.7 10*3/uL (ref 4.0–10.5)
nRBC: 0 % (ref 0.0–0.2)

## 2019-04-25 LAB — URINALYSIS, ROUTINE W REFLEX MICROSCOPIC
Bilirubin Urine: NEGATIVE
Glucose, UA: NEGATIVE mg/dL
Hgb urine dipstick: NEGATIVE
Ketones, ur: NEGATIVE mg/dL
Leukocytes,Ua: NEGATIVE
Nitrite: NEGATIVE
Protein, ur: NEGATIVE mg/dL
Specific Gravity, Urine: 1.012 (ref 1.005–1.030)
pH: 7 (ref 5.0–8.0)

## 2019-04-25 LAB — TYPE AND SCREEN
ABO/RH(D): A POS
Antibody Screen: NEGATIVE

## 2019-04-25 MED ORDER — OXYTOCIN BOLUS FROM INFUSION
500.0000 mL | Freq: Once | INTRAVENOUS | Status: AC
  Administered 2019-04-25: 500 mL via INTRAVENOUS

## 2019-04-25 MED ORDER — MISOPROSTOL 200 MCG PO TABS
ORAL_TABLET | ORAL | Status: AC
  Filled 2019-04-25: qty 4

## 2019-04-25 MED ORDER — FENTANYL CITRATE (PF) 100 MCG/2ML IJ SOLN
100.0000 ug | INTRAMUSCULAR | Status: DC | PRN
  Administered 2019-04-25: 100 ug via INTRAVENOUS
  Filled 2019-04-25 (×2): qty 2

## 2019-04-25 MED ORDER — ONDANSETRON HCL 4 MG/2ML IJ SOLN
4.0000 mg | Freq: Four times a day (QID) | INTRAMUSCULAR | Status: DC | PRN
  Administered 2019-04-25: 4 mg via INTRAVENOUS
  Filled 2019-04-25: qty 2

## 2019-04-25 MED ORDER — FENTANYL-BUPIVACAINE-NACL 0.5-0.125-0.9 MG/250ML-% EP SOLN
EPIDURAL | Status: AC
  Filled 2019-04-25: qty 250

## 2019-04-25 MED ORDER — OXYTOCIN 40 UNITS IN NORMAL SALINE INFUSION - SIMPLE MED
2.5000 [IU]/h | INTRAVENOUS | Status: DC
  Administered 2019-04-25: 2.5 [IU]/h via INTRAVENOUS
  Filled 2019-04-25: qty 1000

## 2019-04-25 MED ORDER — ACETAMINOPHEN 325 MG PO TABS: 650.0000 mg | ORAL_TABLET | ORAL | Status: DC | PRN

## 2019-04-25 MED ORDER — LIDOCAINE HCL (PF) 1 % IJ SOLN
INTRAMUSCULAR | Status: DC | PRN
  Administered 2019-04-25: 5 mL via EPIDURAL

## 2019-04-25 MED ORDER — MISOPROSTOL 200 MCG PO TABS
800.0000 ug | ORAL_TABLET | Freq: Once | ORAL | Status: AC
  Administered 2019-04-25: 800 ug via RECTAL

## 2019-04-25 MED ORDER — LIDOCAINE HCL (PF) 1 % IJ SOLN: 30.0000 mL | INTRAMUSCULAR | Status: DC | PRN

## 2019-04-25 MED ORDER — LIDOCAINE-EPINEPHRINE (PF) 2 %-1:200000 IJ SOLN
INTRAMUSCULAR | Status: DC | PRN
  Administered 2019-04-25: 12 mL via EPIDURAL

## 2019-04-25 MED ORDER — SOD CITRATE-CITRIC ACID 500-334 MG/5ML PO SOLN: 30.0000 mL | ORAL | Status: DC | PRN

## 2019-04-25 MED ORDER — LACTATED RINGERS IV SOLN: INTRAVENOUS | Status: DC

## 2019-04-25 MED ORDER — LACTATED RINGERS IV SOLN: 500.0000 mL | INTRAVENOUS | Status: DC | PRN

## 2019-04-25 NOTE — Progress Notes (Signed)
   04/25/19 1247  Fetal Heart Rate A  Mode External  Baseline Rate (A) 115 bpm  Variability 6-25 BPM  Accelerations 15 x 15  Decelerations Early;Variable  Uterine Activity  Mode Toco;Palpation  Contraction Frequency (min) 6-10  Contraction Duration (sec) 40-50  Contraction Quality Mild  Resting Tone Palpated Relaxed  Cervical Exam  Dilation 4  Effacement (%) 80  Cervical Position Middle  Cervical Consistency Soft  Vag. Bleeding None  Station -1  Presentation Vertex  Exam by: Claudette Laws rnc  Dr Leary Roca called re: pt arrival & background.  Above assessment reported. Orders rec'd to recheck cervix appox 2pm.

## 2019-04-25 NOTE — MAU Note (Signed)
Pt states she began having UCs last pm approx 1900 that have since then become more intense & frequent; rates as 9.5/10. Denies LOF or VB, states she does feel FM, but has been decreased x 2 days.
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
## 2019-04-25 NOTE — Anesthesia Preprocedure Evaluation (Signed)
Anesthesia Evaluation  Patient identified by MRN, date of birth, ID band Patient awake    Reviewed: Allergy & Precautions, NPO status , Patient's Chart, lab work & pertinent test results  Airway Mallampati: II  TM Distance: >3 FB Neck ROM: Full    Dental no notable dental hx. (+) Teeth Intact   Pulmonary asthma , Current Smoker,    Pulmonary exam normal breath sounds clear to auscultation       Cardiovascular hypertension, Normal cardiovascular exam Rhythm:Regular Rate:Normal     Neuro/Psych PSYCHIATRIC DISORDERS Bipolar Disorder negative neurological ROS     GI/Hepatic negative GI ROS, Neg liver ROS,   Endo/Other  negative endocrine ROS  Renal/GU negative Renal ROS     Musculoskeletal   Abdominal   Peds  Hematology hgb 11.1 plt 244   Anesthesia Other Findings   Reproductive/Obstetrics (+) Pregnancy                             Anesthesia Physical Anesthesia Plan  ASA: III  Anesthesia Plan: Epidural   Post-op Pain Management:    Induction:   PONV Risk Score and Plan:   Airway Management Planned:   Additional Equipment:   Intra-op Plan:   Post-operative Plan:   Informed Consent: I have reviewed the patients History and Physical, chart, labs and discussed the procedure including the risks, benefits and alternatives for the proposed anesthesia with the patient or authorized representative who has indicated his/her understanding and acceptance.       Plan Discussed with:   Anesthesia Plan Comments: (37.5 wk G3P2 w hx of Asthma and smoking  For LEA)        Anesthesia Quick Evaluation

## 2019-04-25 NOTE — Anesthesia Procedure Notes (Signed)

## 2019-04-25 NOTE — Discharge Summary (Signed)
Postpartum Discharge Summary     Patient Name: Caroline Ingram DOB: 11-Nov-1985 MRN: [id number]  Date of admission: 04/25/2019 Delivering Provider: Christin Fudge   Date of discharge: 04/27/2019  Admitting diagnosis: Labor and delivery, indication for care [O75.9] Intrauterine pregnancy: [redacted]w[redacted]d    Secondary diagnosis:  Active Problems:   Asthma   Bipolar 2 disorder, major depressive episode ([place]   History of preterm delivery, currently pregnant   Chronic hypertension affecting pregnancy   Nexplanon in place   Tobacco abuse   Obesity in pregnancy   Unwanted fertility   Labor and delivery, indication for care  Additional problems: none     Discharge diagnosis: Term Pregnancy Delivered                                                                                                Post partum procedures:None  Augmentation: none  Complications: None  Hospital course:  Onset of Labor With Vaginal Delivery     34y.o. yo [id number] 33w3dasat 339w3das admitted in Active Labor on 04/25/2019. Patient had an uncomplicated labor course as follows:  Membrane Rupture Time/Date: 9:08 PM ,04/25/2019   Intrapartum Procedures: Episiotomy: None [1]                                         Lacerations:  None [1]  Patient had a delivery of a Viable infant. 04/25/2019  Information for the patient's newborn:  [id number]Delivery Method: Vaginal, Spontaneous(Filed from Delivery Summary)     Pateint had an uncomplicated postpartum course. Reported stable mood post-partum and feeling "very happy and excited." Due to BTL paperwork being signed on 12/23, appt requested for interval BTL. She is ambulating, tolerating a regular diet, passing flatus, and urinating well. Patient is discharged home in stable condition on 04/27/19.  Delivery time: 9:37 PM    Magnesium Sulfate received: No BMZ received: No Rhophylac:N/A MMR:No Transfusion:No  Physical exam  Vitals:   04/26/19  0900 04/26/19 1510 04/26/19 2030 04/27/19 0530  BP: 108/73 119/74 129/65 127/84  Pulse: 69  72 (!) 53  Resp: 18 16 18 18   Temp: 98.4 F (36.9 C) 98.4 F (36.9 C) 98.4 F (36.9 C) 98 F (36.7 C)  TempSrc: Oral Oral Oral Oral  SpO2: 100% 100% 100% 100%  Weight:      Height:       General: alert, cooperative and no distress Lochia: appropriate Uterine Fundus: firm Incision: N/A DVT Evaluation: No evidence of DVT seen on physical exam. Labs: Lab Results  Component Value Date   WBC 6.7 04/25/2019   HGB 11.1 (L) 04/25/2019   HCT 34.9 (L) 04/25/2019   MCV 82.3 04/25/2019   PLT 244 04/25/2019   CMP Latest Ref Rng & Units 11/21/2017  Glucose 70 - 99 mg/dL 99  BUN 6 - 20 mg/dL 10  Creatinine 0.44 - 1.00 mg/dL 0.86  Sodium 135 - 145 mmol/L 138  Potassium 3.5 - 5.1 mmol/L 4.6  Chloride  98 - 111 mmol/L 109  CO2 22 - 32 mmol/L 22  Calcium 8.9 - 10.3 mg/dL 8.3(L)  Total Protein 6.5 - 8.1 g/dL -  Total Bilirubin 0.3 - 1.2 mg/dL -  Alkaline Phos 38 - 126 U/L -  AST 15 - 41 U/L -  ALT 0 - 44 U/L -    Discharge instruction: per After Visit Summary and "Baby and Me Booklet".  After visit meds:  Allergies as of 04/27/2019      Reactions   Chocolate Anaphylaxis   Shrimp [shellfish Allergy] Anaphylaxis   Iodine Itching      Medication List    STOP taking these medications   guaiFENesin 100 MG/5ML Soln Commonly known as: ROBITUSSIN     TAKE these medications   acetaminophen 325 MG tablet Commonly known as: Tylenol Take 2 tablets (650 mg total) by mouth every 6 (six) hours as needed (for pain scale < 4). What changed:   medication strength  how much to take  when to take this  reasons to take this   albuterol 108 (90 Base) MCG/ACT inhaler Commonly known as: VENTOLIN HFA Inhale 2 puffs into the lungs every 4 (four) hours as needed for wheezing or shortness of breath.   ibuprofen 800 MG tablet Commonly known as: ADVIL Take 1 tablet (800 mg total) by mouth every 8  (eight) hours as needed.   prenatal multivitamin Tabs tablet Take 1 tablet by mouth daily at 12 noon.       Diet: routine diet  Activity: Advance as tolerated. Pelvic rest for 6 weeks.   Outpatient follow up:4 weeks Follow up Appt: Future Appointments  Date Time Provider [place]  05/10/2019 10:20 AM [place]  05/28/2019  3:55 PM Darlina Rumpf, CNM [facility]   Follow up Visit:   Please schedule this patient for Postpartum visit in: 4 weeks with the following provider: Any provider For C/S patients schedule nurse incision check in weeks 2 weeks:  High risk pregnancy complicated by: HTN Delivery mode:  SVD Anticipated Birth Control:  Plans Interval BTL papers signed 12/23 PP Procedures needed: BP check  And BTL preop 1-2 weeks Schedule Integrated BH visit: no      Newborn Data: Live born female  Birth Weight: 2430g  APGAR: 9, 9  Newborn Delivery   Birth date/time: 04/25/2019 21:37:00 Delivery type: Vaginal, Spontaneous      Baby Feeding: Breast Disposition:home with mother   04/27/2019 Chauncey Mann, MD

## 2019-04-26 ENCOUNTER — Encounter (HOSPITAL_COMMUNITY): Payer: Self-pay | Admitting: Obstetrics and Gynecology

## 2019-04-26 MED ORDER — COCONUT OIL OIL: 1.0000 "application " | TOPICAL_OIL | Status: DC | PRN

## 2019-04-26 MED ORDER — ZOLPIDEM TARTRATE 5 MG PO TABS: 5.0000 mg | ORAL_TABLET | Freq: Every evening | ORAL | Status: DC | PRN

## 2019-04-26 MED ORDER — FERROUS SULFATE 325 (65 FE) MG PO TABS
325.0000 mg | ORAL_TABLET | Freq: Two times a day (BID) | ORAL | Status: DC
  Administered 2019-04-26 – 2019-04-27 (×3): 325 mg via ORAL
  Filled 2019-04-26 (×3): qty 1

## 2019-04-26 MED ORDER — METHYLERGONOVINE MALEATE 0.2 MG/ML IJ SOLN: 0.2000 mg | INTRAMUSCULAR | Status: DC | PRN

## 2019-04-26 MED ORDER — PRENATAL MULTIVITAMIN CH
1.0000 | ORAL_TABLET | Freq: Every day | ORAL | Status: DC
  Administered 2019-04-26 – 2019-04-27 (×2): 1 via ORAL
  Filled 2019-04-26 (×2): qty 1

## 2019-04-26 MED ORDER — ONDANSETRON HCL 4 MG PO TABS: 4.0000 mg | ORAL_TABLET | ORAL | Status: DC | PRN

## 2019-04-26 MED ORDER — TETANUS-DIPHTH-ACELL PERTUSSIS 5-2.5-18.5 LF-MCG/0.5 IM SUSP: 0.5000 mL | Freq: Once | INTRAMUSCULAR | Status: DC

## 2019-04-26 MED ORDER — BISACODYL 10 MG RE SUPP: 10.0000 mg | Freq: Every day | RECTAL | Status: DC | PRN

## 2019-04-26 MED ORDER — ONDANSETRON HCL 4 MG/2ML IJ SOLN: 4.0000 mg | INTRAMUSCULAR | Status: DC | PRN

## 2019-04-26 MED ORDER — DIPHENHYDRAMINE HCL 25 MG PO CAPS: 25.0000 mg | ORAL_CAPSULE | Freq: Four times a day (QID) | ORAL | Status: DC | PRN

## 2019-04-26 MED ORDER — ACETAMINOPHEN 325 MG PO TABS: 650.0000 mg | ORAL_TABLET | ORAL | Status: DC | PRN

## 2019-04-26 MED ORDER — BENZOCAINE-MENTHOL 20-0.5 % EX AERO: 1.0000 "application " | INHALATION_SPRAY | CUTANEOUS | Status: DC | PRN

## 2019-04-26 MED ORDER — PNEUMOCOCCAL VAC POLYVALENT 25 MCG/0.5ML IJ INJ
0.5000 mL | INJECTION | INTRAMUSCULAR | Status: AC
  Administered 2019-04-27: 0.5 mL via INTRAMUSCULAR
  Filled 2019-04-26: qty 0.5

## 2019-04-26 MED ORDER — SIMETHICONE 80 MG PO CHEW: 80.0000 mg | CHEWABLE_TABLET | ORAL | Status: DC | PRN

## 2019-04-26 MED ORDER — DOCUSATE SODIUM 100 MG PO CAPS
100.0000 mg | ORAL_CAPSULE | Freq: Two times a day (BID) | ORAL | Status: DC
  Administered 2019-04-26 – 2019-04-27 (×4): 100 mg via ORAL
  Filled 2019-04-26 (×4): qty 1

## 2019-04-26 MED ORDER — MEASLES, MUMPS & RUBELLA VAC IJ SOLR: 0.5000 mL | Freq: Once | INTRAMUSCULAR | Status: DC

## 2019-04-26 MED ORDER — FLEET ENEMA 7-19 GM/118ML RE ENEM: 1.0000 | ENEMA | Freq: Every day | RECTAL | Status: DC | PRN

## 2019-04-26 MED ORDER — METHYLERGONOVINE MALEATE 0.2 MG PO TABS: 0.2000 mg | ORAL_TABLET | ORAL | Status: DC | PRN

## 2019-04-26 MED ORDER — DIBUCAINE (PERIANAL) 1 % EX OINT: 1.0000 "application " | TOPICAL_OINTMENT | CUTANEOUS | Status: DC | PRN

## 2019-04-26 MED ORDER — IBUPROFEN 600 MG PO TABS
600.0000 mg | ORAL_TABLET | Freq: Four times a day (QID) | ORAL | Status: DC
  Administered 2019-04-26 – 2019-04-27 (×7): 600 mg via ORAL
  Filled 2019-04-26 (×8): qty 1

## 2019-04-26 MED ORDER — WITCH HAZEL-GLYCERIN EX PADS: 1.0000 "application " | MEDICATED_PAD | CUTANEOUS | Status: DC | PRN

## 2019-04-26 NOTE — Clinical Social Work Maternal (Signed)
CLINICAL SOCIAL WORK MATERNAL/CHILD NOTE  Patient Details  Name: Caroline Ingram MRN: [id number] Date of Birth: 11/01/1985  Date:  04/26/2019  Clinical Social Worker Initiating Note:  Elijio Miles Date/Time: Initiated:  04/26/19/0922     Child's Name:  Cyndi Lennert   Biological Parents:  Mother, Father(Ahana Woodbury and Caroline Ingram)   Need for Interpreter:  None   Reason for Referral:  Behavioral Health Concerns, Current Substance Use/Substance Use During Pregnancy    Address: [address], [place], [place] [address] Room [address]     Phone number:  [phone] (home)     Additional phone number:   Household Members/Support Persons (HM/SP):   Household Member/Support Person 1, Household Member/Support Person 2, Household Member/Support Person 3, Household Member/Support Person 4, Household Member/Support Person 5   HM/SP Name Relationship DOB or Age  HM/SP -[place] FOB    HM/SP -2 Joycie Peek FOB's daughter 11/15/2010  HM/SP -3 Osvaldo Angst FOB's daughter 04/21/2017  HM/SP -4 Kimm Sider Daughter (currently living with Darla Lesches) 02/04/2006  HM/SP -5 Adrianne Coad Daughter (currently living with [place]) 12/12/2006  HM/SP -6        HM/SP -7        HM/SP -8          Natural Supports (not living in the home):  Church, Immediate Family, Extended Family   Professional Supports: None   Employment: Full-time   Type of Work: PCA with [employer]   Education:  High school graduate   Homebound arranged:    Financial Resources:  Medicaid   Other Resources:  Physicist, medical , [place] Considerations Which May Impact Care:    Strengths:  Ability to meet basic needs , Home prepared for child , Pediatrician chosen   Psychotropic Medications:         Pediatrician:       Pediatrician List:   [place]       Pediatrician Fax Number:    Risk Factors/Current Problems:  Substance Use , Mental Health Concerns    Cognitive State:  Able to Concentrate , Alert , Linear Thinking , Insightful    Mood/Affect:  Bright , Calm , Comfortable , Interested , Happy , Relaxed    CSW Assessment: CSW received consult for history of anxiety, depression, bipolar disorder and THC use during pregnancy.  CSW met with MOB to offer support and complete assessment.    MOB resting in bed with infant asleep at bedside, when CSW entered the room. CSW very pleasant and welcoming of CSW visit and was noted to be appropriate and attentive to infant during assessment. MOB stated she and FOB are currently living in a motel with his two children. MOB reported they are in the process of transitioning to a new place and confirmed they can afford to stay in motel and have everything they need for infant there. MOB stated she also has two children of her own but that they are not currently in her care. Per MOB, her oldest daughter has been staying with a friend of MOB's in [place] until MOB can get settled. MOB stated her youngest daughter is with the paternal grandmother and that she is able to talk to her but that they will not allow MOB to see her. MOB denied any CPS involvement in placement of  either child and stated she made the arrangements on her own. MOB reported she still has all rights to both children and has not lost custody of either of them. MOB stated she currently works two jobs, one as a Loss adjuster, chartered for a [employer] and the other with [employer]. MOB stated she receives both [facility] and food stamps and is aware she needs to follow up with both to update them of her delivery.   CSW inquired about MOB's mental health history and MOB acknowledged previous suicide attempt about a year and a half ago. MOB reported previously being on medications and receiving therapy. MOB shared feeling proud of herself during her pregnancy  as she was able to do it unmedicated and did not experience symptoms of depression or SI. MOB did note some sad moments and stress but felt it was manageable. MOB reported stress around previous employment and the relationship with FOB's family but reported she has left her previous job and has blocked FOB's family from contacting her. MOB shared she has decided she won't let anything take her joy away this year and that she is blessed to have infant. MOB acknowledged previous diagnoses of bipolar and PTSD and stated she intends to get in with [facility] and start therapy and medication management. MOB reported feeling "great" and "happy" and shared her pregnancy made her feel good. CSW provided education regarding the baby blues period vs. perinatal mood disorders, discussed treatment and gave resources for mental health follow up if concerns arise. CSW recommended self-evaluation during the postpartum time period using the New Mom Checklist from Postpartum Progress and encouraged MOB to contact a medical professional if symptoms are noted at any time. MOB did not appear to be displaying any acute mental health symptoms and denied any current SI, HI or DV. MOB reported feeling well supported by her pastor, her sister, her friends, infant's god mother and MOB's god mother. MOB confirmed having all essential items for infant once home and stated infant would be sleeping in a pack 'n' play once home. CSW provided review of Sudden Infant Death Syndrome (SIDS) precautions and safe sleeping habits.    MOB did acknowledge use of THC to help cope during her pregnancy but reported she stopped using around 7 months into her pregnancy. CSW informed MOB of Hospital Drug Policy and explained UDS and CDS were still pending but that a CPS report would be made, if warranted. MOB denied any questions or concerns regarding policy but did share previous CPS involvement in 2008 or 2009. MOB reported cases were closed and denied any CPS  involvement since.   CSW will continue to monitor CDS results and make CPS report, if necessary.   CSW Plan/Description:  No Further Intervention Required/No Barriers to Discharge, Sudden Infant Death Syndrome (SIDS) Education, Perinatal Mood and Anxiety Disorder (PMADs) Education, [place], CSW Will Continue to Monitor Umbilical Cord Tissue Drug Screen Results and Make Report if Foye Spurling, LCSW 04/26/2019, 10:29 AM

## 2019-04-26 NOTE — Progress Notes (Addendum)
Post Partum Day 1 Subjective: no complaints, up ad lib, voiding, tolerating PO and + flatus  Objective: Blood pressure 117/73, pulse 63, temperature 98.2 F (36.8 C), temperature source Oral, resp. rate 17, height 5' 3"  (1.6 m), weight 85.3 kg, last menstrual period 07/11/2018, SpO2 100 %, unknown if currently breastfeeding.  Physical Exam:  General: alert, cooperative and no distress  Cardio: Normal S1, S2, no m/r/g Lochia: appropriate Uterine Fundus: firm DVT Evaluation: No evidence of DVT seen on physical exam. No significant calf/ankle edema.  Recent Labs    04/25/19 1500  HGB 11.1*  HCT 34.9*    Assessment/Plan: Caroline Ingram is a 34 yo G3P1203 PPD#1 from SVD at [redacted]w[redacted]d  BTL outpatient (BTL papers 12/23) Breastfeeding - continue lactation consultation  Circumcision outpatient  Plan for discharge tomorrow   LOS: 1 day   LPhill Mutter1/11/2019, 7:58 AM   I personally saw and evaluated the patient, performing the key elements of the service. I developed and verified the management plan that is described in the resident's/student's note, and I agree with the content with my edits above. VSS, HRR&R, Resp unlabored, Legs neg.  FNigel Berthold CNM 04/29/2019 10:20 AM

## 2019-04-26 NOTE — Lactation Note (Addendum)
This note was copied from a baby's chart. Lactation Consultation Note:  P3, Mother has a 69 and 33 yr old that she was unable to breastfeed.  Mother reports that infant is latching well.   Infant is 17 hours old. 37.3 weeks and weight is 5-5.   Mother was given LPI parent information sheet. Reviewed need to supplement infant after breastfeeding. Reviewed supplemental parameters.  Mother is supplementing infant with 22 cal Neosure.   Infant is only taking small amts. of formula.   Mother has good flow of colostrum when hand express.  Attempt to latch infant and infant took a few shallow sucks on mothers nipple.   Assist with hand expression and infant was spoon fed 2-3 ml.   Staff nurse gave mother a hand pump with instructions. Mother reports wanting to use an electric pump. Staff nurse Selena Batten to sat up a DEBP and assist mother with pumping. Mother advised to pump after breastfeeding and supplement infant with any amt of ebm and then formula.   Encouraged mother to watch for infants feeding cues and avoid using a pacifier.   Discussed cluster feeding and LPI behaviors.  Mother was given [facility] brochure and Basic teaching done.  Reviewed phone numbers for 24/7 hot line for breastfeeding questions or concerns.    Patient Name: Caroline Ingram SLHTD'S Date: 04/26/2019 Reason for consult: Initial assessment;1st time breastfeeding;Early term 37-38.6wks   Maternal Data    Feeding Feeding Type: Bottle Fed - Formula Nipple Type: Slow - flow  LATCH Score                   Interventions Interventions: Breast feeding basics reviewed;Assisted with latch;Skin to skin;Breast massage;Hand express;Adjust position;Support pillows;Position options;Hand pump  Lactation Tools Discussed/Used WIC Program: Yes Pump Review: Setup, frequency, and cleaning;Milk Storage Initiated by:: Staff nurse Date initiated:: 04/26/19   Consult Status Consult Status: Follow-up Date:  04/27/19 Follow-up type: In-patient    Stevan Born [facility] 04/26/2019, 3:39 PM

## 2019-04-26 NOTE — Anesthesia Postprocedure Evaluation (Signed)
Anesthesia Post Note  Patient: Caroline Ingram  Procedure(s) Performed: AN AD HOC LABOR EPIDURAL     Patient location during evaluation: Mother Baby Anesthesia Type: Epidural Level of consciousness: awake and alert Pain management: pain level controlled Vital Signs Assessment: post-procedure vital signs reviewed and stable Respiratory status: spontaneous breathing, nonlabored ventilation and respiratory function stable Cardiovascular status: stable Postop Assessment: no headache, no backache, epidural receding, no apparent nausea or vomiting, patient able to bend at knees, adequate PO intake and able to ambulate Anesthetic complications: no    Last Vitals:  Vitals:   04/26/19 0128 04/26/19 0541  BP: 117/83 117/73  Pulse: 67 63  Resp: 18 17  Temp: 36.8 C 36.8 C  SpO2: 100% 100%    Last Pain:  Vitals:   04/26/19 0745  TempSrc:   PainSc: 0-No pain   Pain Goal:                   [place]

## 2019-04-27 MED ORDER — IBUPROFEN 800 MG PO TABS: 800.0000 mg | ORAL_TABLET | Freq: Three times a day (TID) | ORAL | 0 refills | Status: DC | PRN

## 2019-04-27 MED ORDER — ACETAMINOPHEN 325 MG PO TABS: 650.0000 mg | ORAL_TABLET | Freq: Four times a day (QID) | ORAL | 0 refills | Status: DC | PRN

## 2019-05-01 ENCOUNTER — Other Ambulatory Visit: Payer: Medicaid Other

## 2019-05-01 ENCOUNTER — Encounter: Payer: Medicaid Other | Admitting: Obstetrics and Gynecology

## 2019-05-02 ENCOUNTER — Encounter (HOSPITAL_COMMUNITY): Payer: Self-pay

## 2019-05-02 ENCOUNTER — Ambulatory Visit (HOSPITAL_COMMUNITY): Payer: Medicaid Other

## 2019-05-10 ENCOUNTER — Ambulatory Visit: Payer: Medicaid Other

## 2019-05-21 ENCOUNTER — Encounter: Payer: Self-pay | Admitting: *Deleted

## 2019-05-28 ENCOUNTER — Ambulatory Visit: Payer: Medicaid Other | Admitting: Advanced Practice Midwife
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
## 2019-05-30 ENCOUNTER — Encounter (INDEPENDENT_AMBULATORY_CARE_PROVIDER_SITE_OTHER): Payer: Medicaid Other | Admitting: Obstetrics and Gynecology

## 2019-05-30 NOTE — Progress Notes (Signed)
Patient did not keep her appointment today.   Duane Lope, NP 05/30/2019 4:19 PM

## 2019-05-30 NOTE — Progress Notes (Signed)
Called pt at 1521; error message stating call could not be completed. Called pt contact; VM left stating I am trying to reach [facility] and requested she call the office.  Called pt at 1545; same error message received.   Fleet Contras RN 05/30/19

## 2019-05-31 ENCOUNTER — Encounter: Payer: Self-pay | Admitting: *Deleted

## 2019-05-31 ENCOUNTER — Telehealth: Payer: Self-pay | Admitting: *Deleted

## 2019-05-31 NOTE — Telephone Encounter (Signed)
Appointment made for February 23rd at 10am.

## 2019-05-31 NOTE — Telephone Encounter (Signed)
Called pt with appointment information. Information sent to pt advocate at [facility] as well Tilda Franco)  [email or web address] .Gerre Scull
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
## 2019-06-05 ENCOUNTER — Other Ambulatory Visit: Payer: Self-pay

## 2019-06-05 ENCOUNTER — Telehealth: Payer: Medicaid Other | Admitting: Obstetrics and Gynecology

## 2019-06-05 NOTE — Progress Notes (Signed)
Patient did not keep her appointment today.  Duane Lope, NP 06/05/2019 9:05 PM

## 2019-06-05 NOTE — Progress Notes (Signed)
Call cannot be completed as dialed and never loged in to text invite

## 2019-06-12 ENCOUNTER — Other Ambulatory Visit: Payer: Self-pay

## 2019-06-12 DIAGNOSIS — Z20822 Contact with and (suspected) exposure to covid-19: Secondary | ICD-10-CM

## 2019-06-13 LAB — NOVEL CORONAVIRUS, NAA: SARS-CoV-2, NAA: NOT DETECTED

## 2019-06-18 ENCOUNTER — Encounter: Payer: Self-pay | Admitting: *Deleted
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
## 2019-12-09 LAB — RPR: RPR Ser Ql: NONREACTIVE — AB

## 2019-12-26 ENCOUNTER — Other Ambulatory Visit: Payer: Self-pay

## 2019-12-26 ENCOUNTER — Inpatient Hospital Stay (HOSPITAL_COMMUNITY): Payer: Medicaid Other

## 2019-12-26 ENCOUNTER — Inpatient Hospital Stay (HOSPITAL_COMMUNITY)
Admission: AD | Admit: 2019-12-26 | Discharge: 2019-12-26 | Disposition: A | Discharge status: Home / Self Care | Payer: Medicaid Other | Attending: Obstetrics & Gynecology | Admitting: Obstetrics & Gynecology

## 2019-12-26 ENCOUNTER — Encounter (HOSPITAL_COMMUNITY): Payer: Self-pay | Admitting: Obstetrics & Gynecology

## 2019-12-26 DIAGNOSIS — O30041 Twin pregnancy, dichorionic/diamniotic, first trimester: Secondary | ICD-10-CM | POA: Diagnosis not present

## 2019-12-26 DIAGNOSIS — O26891 Other specified pregnancy related conditions, first trimester: Secondary | ICD-10-CM | POA: Diagnosis not present

## 2019-12-26 DIAGNOSIS — O219 Vomiting of pregnancy, unspecified: Secondary | ICD-10-CM | POA: Diagnosis not present

## 2019-12-26 DIAGNOSIS — Z79899 Other long term (current) drug therapy: Secondary | ICD-10-CM | POA: Insufficient documentation

## 2019-12-26 DIAGNOSIS — O209 Hemorrhage in early pregnancy, unspecified: Secondary | ICD-10-CM | POA: Insufficient documentation

## 2019-12-26 DIAGNOSIS — O99341 Other mental disorders complicating pregnancy, first trimester: Secondary | ICD-10-CM | POA: Diagnosis not present

## 2019-12-26 DIAGNOSIS — R109 Unspecified abdominal pain: Secondary | ICD-10-CM | POA: Insufficient documentation

## 2019-12-26 DIAGNOSIS — O26851 Spotting complicating pregnancy, first trimester: Secondary | ICD-10-CM

## 2019-12-26 DIAGNOSIS — Z638 Other specified problems related to primary support group: Secondary | ICD-10-CM | POA: Diagnosis not present

## 2019-12-26 DIAGNOSIS — Z3A01 Less than 8 weeks gestation of pregnancy: Secondary | ICD-10-CM | POA: Diagnosis not present

## 2019-12-26 DIAGNOSIS — Z87891 Personal history of nicotine dependence: Secondary | ICD-10-CM | POA: Insufficient documentation

## 2019-12-26 DIAGNOSIS — Z915 Personal history of self-harm: Secondary | ICD-10-CM | POA: Diagnosis not present

## 2019-12-26 DIAGNOSIS — F909 Attention-deficit hyperactivity disorder, unspecified type: Secondary | ICD-10-CM | POA: Insufficient documentation

## 2019-12-26 DIAGNOSIS — F329 Major depressive disorder, single episode, unspecified: Secondary | ICD-10-CM | POA: Insufficient documentation

## 2019-12-26 LAB — WET PREP, GENITAL
Clue Cells Wet Prep HPF POC: NONE SEEN
Sperm: NONE SEEN
Trich, Wet Prep: NONE SEEN
Yeast Wet Prep HPF POC: NONE SEEN

## 2019-12-26 LAB — POCT PREGNANCY, URINE: Preg Test, Ur: POSITIVE — AB

## 2019-12-26 LAB — CBC
HCT: 35.5 % — ABNORMAL LOW (ref 36.0–46.0)
Hemoglobin: 11.4 g/dL — ABNORMAL LOW (ref 12.0–15.0)
MCH: 27 pg (ref 26.0–34.0)
MCHC: 32.1 g/dL (ref 30.0–36.0)
MCV: 84.1 fL (ref 80.0–100.0)
Platelets: 223 10*3/uL (ref 150–400)
RBC: 4.22 MIL/uL (ref 3.87–5.11)
RDW: 13.8 % (ref 11.5–15.5)
WBC: 6.1 10*3/uL (ref 4.0–10.5)
nRBC: 0 % (ref 0.0–0.2)

## 2019-12-26 LAB — URINALYSIS, ROUTINE W REFLEX MICROSCOPIC
Bacteria, UA: NONE SEEN
Bilirubin Urine: NEGATIVE
Glucose, UA: NEGATIVE mg/dL
Ketones, ur: 5 mg/dL — AB
Leukocytes,Ua: NEGATIVE
Nitrite: NEGATIVE
Protein, ur: 30 mg/dL — AB
Specific Gravity, Urine: 1.019 (ref 1.005–1.030)
pH: 9 — ABNORMAL HIGH (ref 5.0–8.0)

## 2019-12-26 LAB — ABO/RH: ABO/RH(D): A POS

## 2019-12-26 LAB — HCG, QUANTITATIVE, PREGNANCY: hCG, Beta Chain, Quant, S: 166776 m[IU]/mL — ABNORMAL HIGH (ref <5)

## 2019-12-26 MED ORDER — SCOPOLAMINE 1 MG/3DAYS TD PT72: 1.0000 | MEDICATED_PATCH | TRANSDERMAL | 12 refills | Status: DC

## 2019-12-26 MED ORDER — FAMOTIDINE 20 MG PO TABS: 20.0000 mg | ORAL_TABLET | Freq: Two times a day (BID) | ORAL | 5 refills | Status: DC

## 2019-12-26 MED ORDER — SCOPOLAMINE 1 MG/3DAYS TD PT72
1.0000 | MEDICATED_PATCH | TRANSDERMAL | Status: DC
  Administered 2019-12-26: 1.5 mg via TRANSDERMAL
  Filled 2019-12-26: qty 1

## 2019-12-26 MED ORDER — ONDANSETRON 4 MG PO TBDP
8.0000 mg | ORAL_TABLET | Freq: Once | ORAL | Status: AC
  Administered 2019-12-26: 8 mg via ORAL
  Filled 2019-12-26: qty 2

## 2019-12-26 NOTE — Discharge Instructions (Signed)
National Guideline Alliance ([place]).Twin and Triplet Pregnancy. [place]: National Institute for Health and Care Excellence ([place]); 2019.">  Multiple Pregnancy Multiple pregnancy means that a woman is carrying more than one baby at a time. She may be pregnant with twins, triplets, or more. The majority of multiple pregnancies are twins. Naturally conceiving triplets or more (higher-order multiples) is rare. Multiple pregnancies are riskier than single pregnancies. A woman with a multiple pregnancy is more likely to have certain problems during her pregnancy. How does a multiple pregnancy happen? A multiple pregnancy happens when:  The woman's body releases more than one egg at a time, and then each egg gets fertilized by a different sperm. ? This is the most common type of multiple pregnancy. ? Twins or other multiples produced this way are called fraternal. They are no more alike than non-multiple siblings are.  One sperm fertilizes one egg, which then divides into more than one embryo. ? Twins or other multiples produced this way are called identical. Identical multiples are always the same gender, and they look very much alike. Who is most likely to have a multiple pregnancy? A multiple pregnancy is more likely to develop in women who:  Have had fertility treatment, especially if the treatment included fertility medicines.  Are older than 33 years of age.  Have already had four or more children.  Have a family history of multiple pregnancy. How is a multiple pregnancy diagnosed? A multiple pregnancy may be diagnosed based on:  Symptoms such as: ? Rapid weight gain in the first 3 months of pregnancy (first trimester). ? More severe nausea and breast tenderness than what is typical of a single pregnancy. ? A larger uterus than what is normal for the stage of the pregnancy.  Blood tests that detect a higher-than-normal level of human chorionic gonadotropin (hCG). This is a hormone that your  body produces in early pregnancy.  An ultrasound exam. This is used to confirm that you are carrying multiples. What risks come with multiple pregnancy? A multiple pregnancy puts you at a higher risk for certain problems during or after your pregnancy. These include:  Delivering your babies before your due date (preterm birth). A full-term pregnancy lasts for at least 37 weeks. ? Babies born before 37 weeks may have a higher risk for breathing problems, feeding difficulties, cerebral palsy, and learning disabilities.  Diabetes.  Preeclampsia. This is a serious condition that causes high blood pressure and headaches during pregnancy.  Too much blood loss after childbirth (postpartum hemorrhage).  Postpartum depression.  Low birth weight of the babies. How will having a multiple pregnancy affect my care? Your health care team will monitor you more closely. You may need more frequent prenatal visits. This will ensure that you are healthy and that your babies are growing normally. Follow these instructions at home: Eating and drinking  Increase your nutrition. ? Follow your health care provider's recommendations for weight gain. You may need to gain a little extra weight when you are pregnant with multiples. ? Eat healthy snacks often throughout the day. This will add calories and reduce nausea.  Drink enough fluid to keep your urine pale yellow.  Take prenatal vitamins. Ask your health care provider what vitamins are right for you. Activity Limit your activities by 20-24 weeks of pregnancy.  Rest often.  Avoid activities, exercise, and work that take a lot of effort.  Ask your health care provider when you should stop having sex. General instructions  Do not use any   products that contain nicotine or tobacco, such as cigarettes, e-cigarettes, and chewing tobacco. If you need help quitting, ask your health care provider.  Do not drink alcohol or use illegal drugs.  Take  over-the-counter and prescription medicines only as told by your health care provider.  Arrange for extra help around the house.  Keep all follow-up visits and all prenatal visits as told by your health care provider. This is important. Where to find more information  American College of Obstetricians and Gynecology: www.acog.org Contact a health care provider if:  You have dizziness.  You have nausea, vomiting, or diarrhea that does not go away.  You have depression or other emotions that are interfering with your normal activities.  You have a fever.  You have pain with urination.  You have a bad-smelling vaginal discharge.  You notice increased swelling in your face, hands, legs, or ankles. Get help right away if:  You have fluid leaking from your vagina.  You have bleeding from your vagina.  You have pelvic cramps, pelvic pressure, or nagging pain in your abdomen or lower back.  You are having regular contractions.  You have a severe headache, with or without changes in how you see.  You have chest pain or shortness of breath.  You notice that your babies move less often, or do not move at all. Summary  Having a multiple pregnancy means that a woman is carrying more than one baby at a time.  A multiple pregnancy puts you at a higher risk for delivering your babies before your due date, having diabetes, preeclampsia, too much blood loss after childbirth, or low birth weight of the babies.  Your health care provider will monitor you more closely during your pregnancy.  You may need to make some lifestyle changes during pregnancy. This includes eating more, limiting your activities after 20-24 weeks of pregnancy, and arranging for extra help around the house.  Follow up with your health care provider as instructed if you experience any complications. This information is not intended to replace advice given to you by your health care provider. Make sure you discuss  any questions you have with your health care provider. Document Revised: 11/26/2018 Document Reviewed: 11/26/2018 Elsevier Patient Education  2020 Elsevier Inc.  
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
## 2019-12-26 NOTE — MAU Note (Signed)
Pt reports LMP ? July. Pt reports some blood on tissue when she wipes since last pm. Lower abd cramping x one week. Nausea/vomiting every day (15-20 times).

## 2019-12-27 LAB — CULTURE, OB URINE

## 2019-12-27 LAB — GC/CHLAMYDIA PROBE AMP (~~LOC~~) NOT AT ARMC
Chlamydia: NEGATIVE
Comment: NEGATIVE
Comment: NORMAL
Neisseria Gonorrhea: NEGATIVE

## 2019-12-28 ENCOUNTER — Other Ambulatory Visit: Payer: Self-pay | Admitting: Certified Nurse Midwife

## 2019-12-28 DIAGNOSIS — O219 Vomiting of pregnancy, unspecified: Secondary | ICD-10-CM

## 2019-12-28 MED ORDER — ONDANSETRON 8 MG PO TBDP: 8.0000 mg | ORAL_TABLET | Freq: Three times a day (TID) | ORAL | 0 refills | Status: DC | PRN

## 2019-12-28 MED ORDER — SCOPOLAMINE 1 MG/3DAYS TD PT72: 1.0000 | MEDICATED_PATCH | TRANSDERMAL | 3 refills | Status: DC

## 2019-12-28 NOTE — Progress Notes (Signed)
Meds ordered that are covered by pt's insurance.

## 2020-01-09 ENCOUNTER — Telehealth: Payer: Self-pay | Admitting: *Deleted

## 2020-01-09 NOTE — Telephone Encounter (Signed)
Pt left VM message stating that she is 8-[redacted] wks pregnant and having a lot of cramping - mostly on her Rt side. She knows this can be normal but is worried and would like a call back. Also she requests in-office visits for her prenatal care.

## 2020-01-13 ENCOUNTER — Telehealth (INDEPENDENT_AMBULATORY_CARE_PROVIDER_SITE_OTHER): Payer: Medicaid Other | Admitting: *Deleted

## 2020-01-13 ENCOUNTER — Other Ambulatory Visit: Payer: Self-pay

## 2020-01-13 DIAGNOSIS — O0990 Supervision of high risk pregnancy, unspecified, unspecified trimester: Secondary | ICD-10-CM | POA: Insufficient documentation

## 2020-01-13 NOTE — Progress Notes (Signed)
10:38 Rilley not connected virtually.I called her and left a message I am calling for your virtual visit. I did not see you connected and am trying to reach you. I am running a bit late and I apologize for that. Please connect virtually in the next 5 minutes; if I do not see you virtually I will call once more. If we do not connect - please call our office to reschedule Oswin Griffith,RN  10:44 Aislin not connected virtually. I called and left a message I was calling again for your virtual visit and since I was unable to connect with you; you will need to call to reschedule your visit. Donney Caraveo,RN

## 2020-01-14 NOTE — Telephone Encounter (Signed)
Returned patients call. She did not answer. Mailbox was full and not able leave message. Will send My Chart message.

## 2020-01-16 NOTE — Telephone Encounter (Addendum)
Called pt and stated that I am following up on her call from last week to see how she is feeling. Pt stated that she is fine however would like options for where to call for an abortion. I advised that I cannot recommend where she should go however she may access the information requested online. Pt voiced understanding and had no further questions or complaints. Per chart review, note from Edd Arbour on 9/9 @ MAU visit stated that a referral for SW visit w/[facility] would be made however there is no appointment scheduled. I sent a message to Sue Lush requesting her to contact pt.

## 2020-01-20 ENCOUNTER — Encounter: Payer: Medicaid Other | Admitting: Obstetrics and Gynecology

## 2020-01-20 ENCOUNTER — Encounter: Payer: Self-pay | Admitting: Obstetrics and Gynecology

## 2020-01-20 ENCOUNTER — Telehealth: Payer: Self-pay | Admitting: Obstetrics and Gynecology

## 2020-01-20 NOTE — Telephone Encounter (Signed)
Patient missed her New OB appointment. I have called and left her a message to call us back for a new appointment.

## 2020-01-20 NOTE — Progress Notes (Signed)
Patient did not keep her new OB appointment for 01/20/2020.  Cornelia Copa MD Attending [facility] [employer] Midwife)

## 2020-01-21 ENCOUNTER — Telehealth: Payer: Self-pay

## 2020-01-21 NOTE — Telephone Encounter (Signed)
Prior authorization request received from [facility] pharmacy for scopolamine patches. Per chart review, pt has no showed 2 prior appts. Front office staff has not been able to reach patient via phone. Called pharmacy to request medication be put on hold until pt has followed up with office.

## 2020-01-29 NOTE — BH Specialist Note (Signed)
Pt did not arrive to video visit and did not answer the phone ; Voicemail is not set up and unable to leave voice message; left MyChart message for patient.

## 2020-02-04 ENCOUNTER — Encounter: Payer: Medicaid Other | Admitting: Obstetrics and Gynecology

## 2020-02-04 ENCOUNTER — Telehealth: Payer: Self-pay | Admitting: Obstetrics and Gynecology

## 2020-02-04 ENCOUNTER — Ambulatory Visit: Payer: Medicaid Other | Admitting: Clinical

## 2020-02-04 DIAGNOSIS — Z5329 Procedure and treatment not carried out because of patient's decision for other reasons: Secondary | ICD-10-CM

## 2020-02-04 DIAGNOSIS — Z91199 Patient's noncompliance with other medical treatment and regimen due to unspecified reason: Secondary | ICD-10-CM

## 2020-02-04 NOTE — Telephone Encounter (Signed)
Attempted to reach patient via telephone for her missed New OB appointment. Was not able to leave a voicemail. She does not have one.

## 2020-02-12 ENCOUNTER — Encounter: Payer: Medicaid Other | Admitting: Obstetrics and Gynecology

## 2020-02-19 ENCOUNTER — Other Ambulatory Visit: Payer: Self-pay

## 2020-02-19 ENCOUNTER — Encounter (HOSPITAL_COMMUNITY): Payer: Self-pay | Admitting: Obstetrics and Gynecology

## 2020-02-19 ENCOUNTER — Inpatient Hospital Stay (HOSPITAL_COMMUNITY)
Admission: AD | Admit: 2020-02-19 | Discharge: 2020-02-19 | Disposition: A | Discharge status: Home / Self Care | Payer: Medicaid Other | Attending: Obstetrics and Gynecology | Admitting: Obstetrics and Gynecology

## 2020-02-19 DIAGNOSIS — O368390 Maternal care for abnormalities of the fetal heart rate or rhythm, unspecified trimester, not applicable or unspecified: Secondary | ICD-10-CM | POA: Diagnosis not present

## 2020-02-19 DIAGNOSIS — O26892 Other specified pregnancy related conditions, second trimester: Secondary | ICD-10-CM | POA: Diagnosis not present

## 2020-02-19 DIAGNOSIS — M5431 Sciatica, right side: Secondary | ICD-10-CM

## 2020-02-19 DIAGNOSIS — O99891 Other specified diseases and conditions complicating pregnancy: Secondary | ICD-10-CM | POA: Diagnosis not present

## 2020-02-19 DIAGNOSIS — R519 Headache, unspecified: Secondary | ICD-10-CM | POA: Diagnosis not present

## 2020-02-19 DIAGNOSIS — M549 Dorsalgia, unspecified: Secondary | ICD-10-CM | POA: Diagnosis not present

## 2020-02-19 DIAGNOSIS — M5432 Sciatica, left side: Secondary | ICD-10-CM | POA: Diagnosis not present

## 2020-02-19 DIAGNOSIS — Z3A15 15 weeks gestation of pregnancy: Secondary | ICD-10-CM | POA: Diagnosis not present

## 2020-02-19 DIAGNOSIS — O99332 Smoking (tobacco) complicating pregnancy, second trimester: Secondary | ICD-10-CM | POA: Diagnosis not present

## 2020-02-19 DIAGNOSIS — O0990 Supervision of high risk pregnancy, unspecified, unspecified trimester: Secondary | ICD-10-CM

## 2020-02-19 DIAGNOSIS — O219 Vomiting of pregnancy, unspecified: Secondary | ICD-10-CM | POA: Diagnosis not present

## 2020-02-19 DIAGNOSIS — O30042 Twin pregnancy, dichorionic/diamniotic, second trimester: Secondary | ICD-10-CM | POA: Insufficient documentation

## 2020-02-19 DIAGNOSIS — F17210 Nicotine dependence, cigarettes, uncomplicated: Secondary | ICD-10-CM | POA: Insufficient documentation

## 2020-02-19 LAB — URINALYSIS, ROUTINE W REFLEX MICROSCOPIC
Bilirubin Urine: NEGATIVE
Glucose, UA: NEGATIVE mg/dL
Hgb urine dipstick: NEGATIVE
Ketones, ur: NEGATIVE mg/dL
Leukocytes,Ua: NEGATIVE
Nitrite: NEGATIVE
Protein, ur: NEGATIVE mg/dL
Specific Gravity, Urine: 1.029 (ref 1.005–1.030)
pH: 5 (ref 5.0–8.0)

## 2020-02-19 MED ORDER — CYCLOBENZAPRINE HCL 5 MG PO TABS: 5.0000 mg | ORAL_TABLET | Freq: Three times a day (TID) | ORAL | 0 refills | Status: DC | PRN

## 2020-02-19 MED ORDER — SCOPOLAMINE 1 MG/3DAYS TD PT72
1.0000 | MEDICATED_PATCH | Freq: Once | TRANSDERMAL | Status: DC
  Administered 2020-02-19: 1.5 mg via TRANSDERMAL
  Filled 2020-02-19: qty 1

## 2020-02-19 MED ORDER — PROCHLORPERAZINE MALEATE 10 MG PO TABS: 10.0000 mg | ORAL_TABLET | Freq: Three times a day (TID) | ORAL | 0 refills | Status: DC | PRN

## 2020-02-19 MED ORDER — COMFORT FIT MATERNITY SUPP SM MISC: 1.0000 [IU] | Freq: Every day | 0 refills | Status: DC | PRN

## 2020-02-19 NOTE — Discharge Instructions (Signed)
PREGNANCY SUPPORT BELT: You are not alone, Seventy-five percent of women have some sort of abdominal or back pain at some point in their pregnancy. Your baby is growing at a fast pace, which means that your whole body is rapidly trying to adjust to the changes. As your uterus grows, your back may start feeling a bit under stress and this can result in back or abdominal pain that can go from mild, and therefore bearable, to severe pains that will not allow you to sit or lay down comfortably, When it comes to dealing with pregnancy-related pains and cramps, some pregnant women usually prefer natural remedies, which the market is filled with nowadays. For example, wearing a pregnancy support belt can help ease and lessen your discomfort and pain. WHAT ARE THE BENEFITS OF WEARING A PREGNANCY SUPPORT BELT? A pregnancy support belt provides support to the lower portion of the belly taking some of the weight of the growing uterus and distributing to the other parts of your body. It is designed make you comfortable and gives you extra support. Over the years, the pregnancy apparel market has been studying the needs and wants of pregnant women and they have come up with the most comfortable pregnancy support belts that woman could ever ask for. In fact, you will no longer have to wear a stretched-out or bulky pregnancy belt that is visible underneath your clothes and makes you feel even more uncomfortable. Nowadays, a pregnancy support belt is made of comfortable and stretchy materials that will not irritate your skin but will actually make you feel at ease and you will not even notice you are wearing it. They are easy to put on and adjust during the day and can be worn at night for additional support.  BENEFITS: . Relives Back pain . Relieves Abdominal Muscle and Leg Pain . Stabilizes the Pelvic Ring . Offers a Cushioned Abdominal Lift Pad . Relieves pressure on the Sciatic Nerve Within Minutes WHERE TO GET  YOUR PREGNANCY BELT: [employer] [phone] @[address]  [address] [place], [place]      Safe Medications in Pregnancy   Acne: Benzoyl Peroxide Salicylic Acid  Backache/Headache: Tylenol: 2 regular strength every 4 hours OR              2 Extra strength every 6 hours  Colds/Coughs/Allergies: Benadryl (alcohol free) 25 mg every 6 hours as needed Breath right strips Claritin Cepacol throat lozenges Chloraseptic throat spray Cold-Eeze- up to three times per day Cough drops, alcohol free Flonase (by prescription only) Guaifenesin Mucinex Robitussin DM (plain only, alcohol free) Saline nasal spray/drops Sudafed (pseudoephedrine) & Actifed ** use only after [redacted] weeks gestation and if you do not have high blood pressure Tylenol Vicks Vaporub Zinc lozenges Zyrtec   Constipation: Colace Ducolax suppositories Fleet enema Glycerin suppositories Metamucil Milk of magnesia Miralax Senokot Smooth move tea  Diarrhea: Kaopectate Imodium A-D  *NO pepto Bismol  Hemorrhoids: Anusol Anusol HC Preparation H Tucks  Indigestion: Tums Maalox Mylanta Zantac  Pepcid  Insomnia: Benadryl (alcohol free) 25mg  every 6 hours as needed Tylenol PM Unisom, no Gelcaps  Leg Cramps: Tums MagGel  Nausea/Vomiting:  Bonine Dramamine Emetrol Ginger extract Sea bands Meclizine  Nausea medication to take during pregnancy:  Unisom (doxylamine succinate 25 mg tablets) Take one tablet daily at bedtime. If symptoms are not adequately controlled, the dose can be increased to a maximum recommended dose of two tablets daily (1/2 tablet in the morning, 1/2 tablet mid-afternoon  and one at bedtime). Vitamin B6 100mg  tablets. Take one tablet twice a day (up to 200 mg per day).  Skin Rashes: Aveeno products Benadryl cream or 25mg  every 6 hours as needed Calamine Lotion 1% cortisone cream  Yeast infection: Gyne-lotrimin 7 Monistat 7  Gum/tooth  pain: Anbesol  **If taking multiple medications, please check labels to avoid duplicating the same active ingredients **take medication as directed on the label ** Do not exceed 4000 mg of tylenol in 24 hours **Do not take medications that contain aspirin or ibuprofen     Morning Sickness  Morning sickness is when a woman feels nauseous during pregnancy. This nauseous feeling may or may not come with vomiting. It often occurs in the morning, but it can be a problem at any time of day. Morning sickness is most common during the first trimester. In some cases, it may continue throughout pregnancy. Although morning sickness is unpleasant, it is usually harmless unless the woman develops severe and continual vomiting (hyperemesis gravidarum), a condition that requires more intense treatment. What are the causes? The exact cause of this condition is not known, but it seems to be related to normal hormonal changes that occur in pregnancy. What increases the risk? You are more likely to develop this condition if:  You experienced nausea or vomiting before your pregnancy.  You had morning sickness during a previous pregnancy.  You are pregnant with more than one baby, such as twins. What are the signs or symptoms? Symptoms of this condition include:  Nausea.  Vomiting. How is this diagnosed? This condition is usually diagnosed based on your signs and symptoms. How is this treated? In many cases, treatment is not needed for this condition. Making some changes to what you eat may help to control symptoms. Your health care provider may also prescribe or recommend:  Vitamin B6 supplements.  Anti-nausea medicines.  Ginger. Follow these instructions at home: Medicines  Take over-the-counter and prescription medicines only as told by your health care provider. Do not use any prescription, over-the-counter, or herbal medicines for morning sickness without first talking with your health  care provider.  Taking multivitamins before getting pregnant can prevent or decrease the severity of morning sickness in most women. Eating and drinking  Eat a piece of dry toast or crackers before getting out of bed in the morning.  Eat 5 or 6 small meals a day.  Eat dry and bland foods, such as rice or a baked potato. Foods that are high in carbohydrates are often helpful.  Avoid greasy, fatty, and spicy foods.  Have someone cook for you if the smell of any food causes nausea and vomiting.  If you feel nauseous after taking prenatal vitamins, take the vitamins at night or with a snack.  Snack on protein foods between meals if you are hungry. Nuts, yogurt, and cheese are good options.  Drink fluids throughout the day.  Try ginger ale made with real ginger, ginger tea made from fresh grated ginger, or ginger candies. General instructions  Do not use any products that contain nicotine or tobacco, such as cigarettes and e-cigarettes. If you need help quitting, ask your health care provider.  Get an air purifier to keep the air in your house free of odors.  Get plenty of fresh air.  Try to avoid odors that trigger your nausea.  Consider trying these methods to help relieve symptoms: ? Wearing an acupressure wristband. These wristbands are often worn for seasickness. ? Acupuncture. Contact a  health care provider if:  Your home remedies are not working and you need medicine.  You feel dizzy or light-headed.  You are losing weight. Get help right away if:  You have persistent and uncontrolled nausea and vomiting.  You faint.  You have severe pain in your abdomen. Summary  Morning sickness is when a woman feels nauseous during pregnancy. This nauseous feeling may or may not come with vomiting.  Morning sickness is most common during the first trimester.  It often occurs in the morning, but it can be a problem at any time of day.  In many cases, treatment is not  needed for this condition. Making some changes to what you eat may help to control symptoms. This information is not intended to replace advice given to you by your health care provider. Make sure you discuss any questions you have with your health care provider. Document Revised: 03/17/2017 Document Reviewed: 05/07/2016 Elsevier Patient Education  2020 [employer].
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
## 2020-02-19 NOTE — MAU Note (Addendum)
Presents with c/o pain on right side radiating into  right leg.  States pain feels like pin and needles and she has difficulty walking.  Reports she felt "something pop" when changing positions in the bed. States she has Hx of sciatic pain with previous pregnancy.  Took Tylenol @ 0400 this morning, no relief. Denies VB or LOF. Reports Twin gestation.
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
## 2020-02-26 ENCOUNTER — Encounter: Payer: Medicaid Other | Admitting: Obstetrics & Gynecology

## 2020-04-02 ENCOUNTER — Telehealth: Payer: Self-pay | Admitting: Obstetrics & Gynecology

## 2020-04-02 ENCOUNTER — Encounter: Payer: Medicaid Other | Admitting: Obstetrics & Gynecology

## 2020-04-02 NOTE — Telephone Encounter (Signed)
Attempted to reach patient about her missed appointment, and was not able to leave a message. No answered phone.

## 2020-04-09 ENCOUNTER — Telehealth: Payer: Self-pay | Admitting: Family Medicine

## 2020-04-09 NOTE — Telephone Encounter (Signed)
Returned patients call. Patient did not answer. LM for patient to call the office is she still has questions or concerns. Left holiday hours on her voicemail and advised she can call or send My Chart message in today.

## 2020-04-09 NOTE — Telephone Encounter (Signed)
Patient called to reschedule her appointment. She stated she knew she had missed a few appointments, but would come to this one for sure. She has a few questions for the nurse, and is requesting a call back today.

## 2020-04-18 ENCOUNTER — Other Ambulatory Visit: Payer: Self-pay

## 2020-04-18 NOTE — L&D Delivery Note (Signed)
Delivery Note Baby A Patient arrived via EMS, fully dilated with bag intact and feet in the vagina. Instructed to push. SVD of viable female infant over intact perineum en caul. Clear fluid, bag cut and opened. Spontaneous cry heard. Cord clamped x 2, cut. Spontaneous cry heard. Weight and Apgars pending. Cord blood obtained. Placenta delivered with the infant.  Delivery Note Baby B U/S revealed baby B breech and bag intact. Pushed x 3 contractions and delivered. BOW ruptured with delivery. Cord clamped x 2 and cut and given to waiting peds. Spontaneous cry heard. Cord blood obtained. Placenta delivered intact and sent to pathology Vagina inspected.0 lacerations noted.  EBL: 100 cc Anesthesia: none

## 2020-04-20 ENCOUNTER — Other Ambulatory Visit: Payer: Self-pay | Admitting: Obstetrics and Gynecology

## 2020-04-20 MED ORDER — PROCHLORPERAZINE MALEATE 10 MG PO TABS
10.0000 mg | ORAL_TABLET | Freq: Three times a day (TID) | ORAL | 0 refills | Status: DC | PRN
Start: 2020-04-20 — End: 2020-05-18

## 2020-04-20 NOTE — Progress Notes (Signed)
Compazine refill sent.  Patient should be evaluated for back pain prior to refill on flexeril.    Thank you,  Victorino Dike, NP

## 2020-04-22 ENCOUNTER — Encounter: Payer: Self-pay | Admitting: Family Medicine

## 2020-04-22 ENCOUNTER — Ambulatory Visit (INDEPENDENT_AMBULATORY_CARE_PROVIDER_SITE_OTHER): Payer: Medicaid Other | Admitting: Family Medicine

## 2020-04-22 ENCOUNTER — Other Ambulatory Visit: Payer: Self-pay

## 2020-04-22 ENCOUNTER — Other Ambulatory Visit (HOSPITAL_COMMUNITY)
Admission: RE | Admit: 2020-04-22 | Discharge: 2020-04-22 | Disposition: A | Discharge status: Home / Self Care | Payer: Medicaid Other | Source: Ambulatory Visit | Attending: Family Medicine | Admitting: Family Medicine

## 2020-04-22 VITALS — BP 122/65 | HR 61

## 2020-04-22 DIAGNOSIS — O10919 Unspecified pre-existing hypertension complicating pregnancy, unspecified trimester: Secondary | ICD-10-CM

## 2020-04-22 DIAGNOSIS — O30042 Twin pregnancy, dichorionic/diamniotic, second trimester: Secondary | ICD-10-CM | POA: Diagnosis not present

## 2020-04-22 DIAGNOSIS — Z3009 Encounter for other general counseling and advice on contraception: Secondary | ICD-10-CM

## 2020-04-22 DIAGNOSIS — J45909 Unspecified asthma, uncomplicated: Secondary | ICD-10-CM | POA: Diagnosis not present

## 2020-04-22 DIAGNOSIS — O10912 Unspecified pre-existing hypertension complicating pregnancy, second trimester: Secondary | ICD-10-CM

## 2020-04-22 DIAGNOSIS — F3181 Bipolar II disorder: Secondary | ICD-10-CM

## 2020-04-22 DIAGNOSIS — O0990 Supervision of high risk pregnancy, unspecified, unspecified trimester: Secondary | ICD-10-CM

## 2020-04-22 DIAGNOSIS — Z720 Tobacco use: Secondary | ICD-10-CM

## 2020-04-22 DIAGNOSIS — Z975 Presence of (intrauterine) contraceptive device: Secondary | ICD-10-CM

## 2020-04-22 DIAGNOSIS — O0992 Supervision of high risk pregnancy, unspecified, second trimester: Secondary | ICD-10-CM | POA: Diagnosis not present

## 2020-04-22 DIAGNOSIS — Z3A24 24 weeks gestation of pregnancy: Secondary | ICD-10-CM

## 2020-04-22 LAB — POCT URINALYSIS DIP (DEVICE)
Bilirubin Urine: NEGATIVE
Glucose, UA: NEGATIVE mg/dL
Hgb urine dipstick: NEGATIVE
Nitrite: NEGATIVE
Protein, ur: 100 mg/dL — AB
Specific Gravity, Urine: >=1.03 (ref 1.005–1.030)
Urobilinogen, UA: 2 mg/dL — ABNORMAL HIGH (ref 0.0–1.0)
pH: 6 (ref 5.0–8.0)

## 2020-04-22 MED ORDER — CYCLOBENZAPRINE HCL 5 MG PO TABS: 5.0000 mg | ORAL_TABLET | Freq: Three times a day (TID) | ORAL | 0 refills | Status: DC | PRN

## 2020-04-22 MED ORDER — COMFORT FIT MATERNITY SUPP MED MISC: 1.0000 | 0 refills | Status: DC | PRN

## 2020-04-22 MED ORDER — POLYETHYLENE GLYCOL 3350 17 GM/SCOOP PO POWD: 17.0000 g | Freq: Every day | ORAL | 3 refills | Status: DC | PRN

## 2020-04-22 MED ORDER — SERTRALINE HCL 150 MG PO CAPS: 150.0000 mg | ORAL_CAPSULE | Freq: Every day | ORAL | 5 refills | Status: DC

## 2020-04-22 MED ORDER — PILL SPLITTER MISC: 1.0000 | 0 refills | Status: DC | PRN

## 2020-04-22 NOTE — Progress Notes (Signed)
Informal bedside ultrasound obtained for [facility]. Baby A- 143 Baby B- 133
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
## 2020-04-22 NOTE — Patient Instructions (Addendum)
You can obtain a maternity belt at [facility] or [facility], call ahead to confirm they have stock    Contraception Choices Contraception, also called birth control, refers to methods or devices that prevent pregnancy. Hormonal methods Contraceptive implant  A contraceptive implant is a thin, plastic tube that contains a hormone. It is inserted into the upper part of the arm. It can remain in place for up to 3 years. Progestin-only injections Progestin-only injections are injections of progestin, a synthetic form of the hormone progesterone. They are given every 3 months by a health care provider. Birth control pills  Birth control pills are pills that contain hormones that prevent pregnancy. They must be taken once a day, preferably at the same time each day. Birth control patch  The birth control patch contains hormones that prevent pregnancy. It is placed on the skin and must be changed once a week for three weeks and removed on the fourth week. A prescription is needed to use this method of contraception. Vaginal ring  A vaginal ring contains hormones that prevent pregnancy. It is placed in the vagina for three weeks and removed on the fourth week. After that, the process is repeated with a new ring. A prescription is needed to use this method of contraception. Emergency contraceptive Emergency contraceptives prevent pregnancy after unprotected sex. They come in pill form and can be taken up to 5 days after sex. They work best the sooner they are taken after having sex. Most emergency contraceptives are available without a prescription. This method should not be used as your only form of birth control. Barrier methods Female condom  A female condom is a thin sheath that is worn over the penis during sex. Condoms keep sperm from going inside a woman's body. They can be used with a spermicide to increase their effectiveness. They should be disposed after a  single use. Female condom  A female condom is a soft, loose-fitting sheath that is put into the vagina before sex. The condom keeps sperm from going inside a woman's body. They should be disposed after a single use. Diaphragm  A diaphragm is a soft, dome-shaped barrier. It is inserted into the vagina before sex, along with a spermicide. The diaphragm blocks sperm from entering the uterus, and the spermicide kills sperm. A diaphragm should be left in the vagina for 6-8 hours after sex and removed within 24 hours. A diaphragm is prescribed and fitted by a health care provider. A diaphragm should be replaced every 1-2 years, after giving birth, after gaining more than 15 lb (6.8 kg), and after pelvic surgery. Cervical cap  A cervical cap is a round, soft latex or plastic cup that fits over the cervix. It is inserted into the vagina before sex, along with spermicide. It blocks sperm from entering the uterus. The cap should be left in place for 6-8 hours after sex and removed within 48 hours. A cervical cap must be prescribed and fitted by a health care provider. It should be replaced every 2 years. Sponge  A sponge is a soft, circular piece of polyurethane foam with spermicide on it. The sponge helps block sperm from entering the uterus, and the spermicide kills sperm. To use it, you make it wet and then insert it into the vagina. It should be inserted before sex, left in for at least 6 hours after sex, and removed and thrown away within 30 hours. Spermicides Spermicides are chemicals that kill or block  sperm from entering the cervix and uterus. They can come as a cream, jelly, suppository, foam, or tablet. A spermicide should be inserted into the vagina with an applicator at least 10-15 minutes before sex to allow time for it to work. The process must be repeated every time you have sex. Spermicides do not require a prescription. Intrauterine contraception Intrauterine device (IUD) An IUD is a  T-shaped device that is put in a woman's uterus. There are two types:  Hormone IUD.This type contains progestin, a synthetic form of the hormone progesterone. This type can stay in place for 3-5 years.  Copper IUD.This type is wrapped in copper wire. It can stay in place for 10 years.  Permanent methods of contraception Female tubal ligation In this method, a woman's fallopian tubes are sealed, tied, or blocked during surgery to prevent eggs from traveling to the uterus. Hysteroscopic sterilization In this method, a small, flexible insert is placed into each fallopian tube. The inserts cause scar tissue to form in the fallopian tubes and block them, so sperm cannot reach an egg. The procedure takes about 3 months to be effective. Another form of birth control must be used during those 3 months. Female sterilization This is a procedure to tie off the tubes that carry sperm (vasectomy). After the procedure, the man can still ejaculate fluid (semen). Natural planning methods Natural family planning In this method, a couple does not have sex on days when the woman could become pregnant. Calendar method This means keeping track of the length of each menstrual cycle, identifying the days when pregnancy can happen, and not having sex on those days. Ovulation method In this method, a couple avoids sex during ovulation. Symptothermal method This method involves not having sex during ovulation. The woman typically checks for ovulation by watching changes in her temperature and in the consistency of cervical mucus. Post-ovulation method In this method, a couple waits to have sex until after ovulation. Summary  Contraception, also called birth control, means methods or devices that prevent pregnancy.  Hormonal methods of contraception include implants, injections, pills, patches, vaginal rings, and emergency contraceptives.  Barrier methods of contraception can include female condoms, female condoms,  diaphragms, cervical caps, sponges, and spermicides.  There are two types of IUDs (intrauterine devices). An IUD can be put in a woman's uterus to prevent pregnancy for 3-5 years.  Permanent sterilization can be done through a procedure for males, females, or both.  Natural family planning methods involve not having sex on days when the woman could become pregnant. This information is not intended to replace advice given to you by your health care provider. Make sure you discuss any questions you have with your health care provider. Document Revised: 04/06/2017 Document Reviewed: 05/07/2016 Elsevier Patient Education  2020 [employer]   Breastfeeding  Choosing to breastfeed is one of the best decisions you can make for yourself and your baby. A change in hormones during pregnancy causes your breasts to make breast milk in your milk-producing glands. Hormones prevent breast milk from being released before your baby is born. They also prompt milk flow after birth. Once breastfeeding has begun, thoughts of your baby, as well as his or her sucking or crying, can stimulate the release of milk from your milk-producing glands. Benefits of breastfeeding Research shows that breastfeeding offers many health benefits for infants and mothers. It also offers a cost-free and convenient way to feed your baby. For your baby  Your first milk (colostrum) helps your  baby's digestive system to function better.  Special cells in your milk (antibodies) help your baby to fight off infections.  Breastfed babies are less likely to develop asthma, allergies, obesity, or type 2 diabetes. They are also at lower risk for sudden infant death syndrome (SIDS).  Nutrients in breast milk are better able to meet your baby's needs compared to infant formula.  Breast milk improves your baby's brain development. For you  Breastfeeding helps to create a very special bond between you and your baby.  Breastfeeding is  convenient. Breast milk costs nothing and is always available at the correct temperature.  Breastfeeding helps to burn calories. It helps you to lose the weight that you gained during pregnancy.  Breastfeeding makes your uterus return faster to its size before pregnancy. It also slows bleeding (lochia) after you give birth.  Breastfeeding helps to lower your risk of developing type 2 diabetes, osteoporosis, rheumatoid arthritis, cardiovascular disease, and breast, ovarian, uterine, and endometrial cancer later in life. Breastfeeding basics Starting breastfeeding  Find a comfortable place to sit or lie down, with your neck and back well-supported.  Place a pillow or a rolled-up blanket under your baby to bring him or her to the level of your breast (if you are seated). Nursing pillows are specially designed to help support your arms and your baby while you breastfeed.  Make sure that your baby's tummy (abdomen) is facing your abdomen.  Gently massage your breast. With your fingertips, massage from the outer edges of your breast inward toward the nipple. This encourages milk flow. If your milk flows slowly, you may need to continue this action during the feeding.  Support your breast with 4 fingers underneath and your thumb above your nipple (make the letter "C" with your hand). Make sure your fingers are well away from your nipple and your baby's mouth.  Stroke your baby's lips gently with your finger or nipple.  When your baby's mouth is open wide enough, quickly bring your baby to your breast, placing your entire nipple and as much of the areola as possible into your baby's mouth. The areola is the colored area around your nipple. ? More areola should be visible above your baby's upper lip than below the lower lip. ? Your baby's lips should be opened and extended outward (flanged) to ensure an adequate, comfortable latch. ? Your baby's tongue should be between his or her lower gum and your  breast.  Make sure that your baby's mouth is correctly positioned around your nipple (latched). Your baby's lips should create a seal on your breast and be turned out (everted).  It is common for your baby to suck about 2-3 minutes in order to start the flow of breast milk. Latching Teaching your baby how to latch onto your breast properly is very important. An improper latch can cause nipple pain, decreased milk supply, and poor weight gain in your baby. Also, if your baby is not latched onto your nipple properly, he or she may swallow some air during feeding. This can make your baby fussy. Burping your baby when you switch breasts during the feeding can help to get rid of the air. However, teaching your baby to latch on properly is still the best way to prevent fussiness from swallowing air while breastfeeding. Signs that your baby has successfully latched onto your nipple  Silent tugging or silent sucking, without causing you pain. Infant's lips should be extended outward (flanged).  Swallowing heard between every 3-4 sucks  once your milk has started to flow (after your let-down milk reflex occurs).  Muscle movement above and in front of his or her ears while sucking. Signs that your baby has not successfully latched onto your nipple  Sucking sounds or smacking sounds from your baby while breastfeeding.  Nipple pain. If you think your baby has not latched on correctly, slip your finger into the corner of your baby's mouth to break the suction and place it between your baby's gums. Attempt to start breastfeeding again. Signs of successful breastfeeding Signs from your baby  Your baby will gradually decrease the number of sucks or will completely stop sucking.  Your baby will fall asleep.  Your baby's body will relax.  Your baby will retain a small amount of milk in his or her mouth.  Your baby will let go of your breast by himself or herself. Signs from you  Breasts that have  increased in firmness, weight, and size 1-3 hours after feeding.  Breasts that are softer immediately after breastfeeding.  Increased milk volume, as well as a change in milk consistency and color by the fifth day of breastfeeding.  Nipples that are not sore, cracked, or bleeding. Signs that your baby is getting enough milk  Wetting at least 1-2 diapers during the first 24 hours after birth.  Wetting at least 5-6 diapers every 24 hours for the first week after birth. The urine should be clear or pale yellow by the age of 5 days.  Wetting 6-8 diapers every 24 hours as your baby continues to grow and develop.  At least 3 stools in a 24-hour period by the age of 5 days. The stool should be soft and yellow.  At least 3 stools in a 24-hour period by the age of 7 days. The stool should be seedy and yellow.  No loss of weight greater than 10% of birth weight during the first 3 days of life.  Average weight gain of 4-7 oz (113-198 g) per week after the age of 4 days.  Consistent daily weight gain by the age of 5 days, without weight loss after the age of 2 weeks. After a feeding, your baby may spit up a small amount of milk. This is normal. Breastfeeding frequency and duration Frequent feeding will help you make more milk and can prevent sore nipples and extremely full breasts (breast engorgement). Breastfeed when you feel the need to reduce the fullness of your breasts or when your baby shows signs of hunger. This is called "breastfeeding on demand." Signs that your baby is hungry include:  Increased alertness, activity, or restlessness.  Movement of the head from side to side.  Opening of the mouth when the corner of the mouth or cheek is stroked (rooting).  Increased sucking sounds, smacking lips, cooing, sighing, or squeaking.  Hand-to-mouth movements and sucking on fingers or hands.  Fussing or crying. Avoid introducing a pacifier to your baby in the first 4-6 weeks after your  baby is born. After this time, you may choose to use a pacifier. Research has shown that pacifier use during the first year of a baby's life decreases the risk of sudden infant death syndrome (SIDS). Allow your baby to feed on each breast as long as he or she wants. When your baby unlatches or falls asleep while feeding from the first breast, offer the second breast. Because newborns are often sleepy in the first few weeks of life, you may need to awaken your baby to  get him or her to feed. Breastfeeding times will vary from baby to baby. However, the following rules can serve as a guide to help you make sure that your baby is properly fed:  Newborns (babies 69 weeks of age or younger) may breastfeed every 1-3 hours.  Newborns should not go without breastfeeding for longer than 3 hours during the day or 5 hours during the night.  You should breastfeed your baby a minimum of 8 times in a 24-hour period. Breast milk pumping     Pumping and storing breast milk allows you to make sure that your baby is exclusively fed your breast milk, even at times when you are unable to breastfeed. This is especially important if you go back to work while you are still breastfeeding, or if you are not able to be present during feedings. Your lactation consultant can help you find a method of pumping that works best for you and give you guidelines about how long it is safe to store breast milk. Caring for your breasts while you breastfeed Nipples can become dry, cracked, and sore while breastfeeding. The following recommendations can help keep your breasts moisturized and healthy:  Avoid using soap on your nipples.  Wear a supportive bra designed especially for nursing. Avoid wearing underwire-style bras or extremely tight bras (sports bras).  Air-dry your nipples for 3-4 minutes after each feeding.  Use only cotton bra pads to absorb leaked breast milk. Leaking of breast milk between feedings is normal.  Use  lanolin on your nipples after breastfeeding. Lanolin helps to maintain your skin's normal moisture barrier. Pure lanolin is not harmful (not toxic) to your baby. You may also hand express a few drops of breast milk and gently massage that milk into your nipples and allow the milk to air-dry. In the first few weeks after giving birth, some women experience breast engorgement. Engorgement can make your breasts feel heavy, warm, and tender to the touch. Engorgement peaks within 3-5 days after you give birth. The following recommendations can help to ease engorgement:  Completely empty your breasts while breastfeeding or pumping. You may want to start by applying warm, moist heat (in the shower or with warm, water-soaked hand towels) just before feeding or pumping. This increases circulation and helps the milk flow. If your baby does not completely empty your breasts while breastfeeding, pump any extra milk after he or she is finished.  Apply ice packs to your breasts immediately after breastfeeding or pumping, unless this is too uncomfortable for you. To do this: ? Put ice in a plastic bag. ? Place a towel between your skin and the bag. ? Leave the ice on for 20 minutes, 2-3 times a day.  Make sure that your baby is latched on and positioned properly while breastfeeding. If engorgement persists after 48 hours of following these recommendations, contact your health care provider or a Advertising copywriter. Overall health care recommendations while breastfeeding  Eat 3 healthy meals and 3 snacks every day. Well-nourished mothers who are breastfeeding need an additional 450-500 calories a day. You can meet this requirement by increasing the amount of a balanced diet that you eat.  Drink enough water to keep your urine pale yellow or clear.  Rest often, relax, and continue to take your prenatal vitamins to prevent fatigue, stress, and low vitamin and mineral levels in your body (nutrient  deficiencies).  Do not use any products that contain nicotine or tobacco, such as cigarettes and e-cigarettes. Your baby  may be harmed by chemicals from cigarettes that pass into breast milk and exposure to secondhand smoke. If you need help quitting, ask your health care provider.  Avoid alcohol.  Do not use illegal drugs or marijuana.  Talk with your health care provider before taking any medicines. These include over-the-counter and prescription medicines as well as vitamins and herbal supplements. Some medicines that may be harmful to your baby can pass through breast milk.  It is possible to become pregnant while breastfeeding. If birth control is desired, ask your health care provider about options that will be safe while breastfeeding your baby. Where to find more information: [employer]: www.llli.org Contact a health care provider if:  You feel like you want to stop breastfeeding or have become frustrated with breastfeeding.  Your nipples are cracked or bleeding.  Your breasts are red, tender, or warm.  You have: ? Painful breasts or nipples. ? A swollen area on either breast. ? A fever or chills. ? Nausea or vomiting. ? Drainage other than breast milk from your nipples.  Your breasts do not become full before feedings by the fifth day after you give birth.  You feel sad and depressed.  Your baby is: ? Too sleepy to eat well. ? Having trouble sleeping. ? More than 22 week old and wetting fewer than 6 diapers in a 24-hour period. ? Not gaining weight by 67 days of age.  Your baby has fewer than 3 stools in a 24-hour period.  Your baby's skin or the white parts of his or her eyes become yellow. Get help right away if:  Your baby is overly tired (lethargic) and does not want to wake up and feed.  Your baby develops an unexplained fever. Summary  Breastfeeding offers many health benefits for infant and mothers.  Try to breastfeed your infant when  he or she shows early signs of hunger.  Gently tickle or stroke your baby's lips with your finger or nipple to allow the baby to open his or her mouth. Bring the baby to your breast. Make sure that much of the areola is in your baby's mouth. Offer one side and burp the baby before you offer the other side.  Talk with your health care provider or lactation consultant if you have questions or you face problems as you breastfeed. This information is not intended to replace advice given to you by your health care provider. Make sure you discuss any questions you have with your health care provider. Document Revised: 06/29/2017 Document Reviewed: 05/06/2016 Elsevier Patient Education  [employer].

## 2020-04-23 DIAGNOSIS — O30042 Twin pregnancy, dichorionic/diamniotic, second trimester: Secondary | ICD-10-CM | POA: Insufficient documentation

## 2020-04-23 LAB — CBC
Hematocrit: 31.5 % — ABNORMAL LOW (ref 34.0–46.6)
Hemoglobin: 10.3 g/dL — ABNORMAL LOW (ref 11.1–15.9)
MCH: 27.8 pg (ref 26.6–33.0)
MCHC: 32.7 g/dL (ref 31.5–35.7)
MCV: 85 fL (ref 79–97)
Platelets: 260 10*3/uL (ref 150–450)
RBC: 3.71 x10E6/uL — ABNORMAL LOW (ref 3.77–5.28)
RDW: 13 % (ref 11.7–15.4)
WBC: 8 10*3/uL (ref 3.4–10.8)

## 2020-04-23 LAB — COMPREHENSIVE METABOLIC PANEL
ALT: 12 IU/L (ref 0–32)
AST: 11 IU/L (ref 0–40)
Albumin/Globulin Ratio: 1.4 (ref 1.2–2.2)
Albumin: 3.8 g/dL (ref 3.8–4.8)
Alkaline Phosphatase: 78 IU/L (ref 44–121)
BUN/Creatinine Ratio: 16 (ref 9–23)
BUN: 7 mg/dL (ref 6–20)
Bilirubin Total: <0.2 mg/dL (ref 0.0–1.2)
CO2: 21 mmol/L (ref 20–29)
Calcium: 9 mg/dL (ref 8.7–10.2)
Chloride: 105 mmol/L (ref 96–106)
Creatinine, Ser: 0.43 mg/dL — ABNORMAL LOW (ref 0.57–1.00)
GFR calc Af Amer: 155 mL/min/{1.73_m2} (ref >59)
GFR calc non Af Amer: 134 mL/min/{1.73_m2} (ref >59)
Globulin, Total: 2.7 g/dL (ref 1.5–4.5)
Glucose: 85 mg/dL (ref 65–99)
Potassium: 4 mmol/L (ref 3.5–5.2)
Sodium: 138 mmol/L (ref 134–144)
Total Protein: 6.5 g/dL (ref 6.0–8.5)

## 2020-04-23 LAB — PROTEIN / CREATININE RATIO, URINE
Creatinine, Urine: 253.1 mg/dL
Protein, Ur: 40.6 mg/dL
Protein/Creat Ratio: 160 mg/g creat (ref 0–200)

## 2020-04-23 LAB — GC/CHLAMYDIA PROBE AMP (~~LOC~~) NOT AT ARMC
Chlamydia: NEGATIVE
Comment: NEGATIVE
Comment: NORMAL
Neisseria Gonorrhea: NEGATIVE

## 2020-04-23 LAB — HEMOGLOBIN A1C
Est. average glucose Bld gHb Est-mCnc: 108 mg/dL
Hgb A1c MFr Bld: 5.4 % (ref 4.8–5.6)

## 2020-04-23 NOTE — Progress Notes (Signed)
Home medicaid form completed   Zannie Kehr RN 04/23/20

## 2020-04-24 LAB — CULTURE, OB URINE

## 2020-04-24 LAB — URINE CULTURE, OB REFLEX

## 2020-04-28 NOTE — Progress Notes (Signed)
Medicaid Home Form Completed-04/22/20

## 2020-05-07 NOTE — BH Specialist Note (Deleted)
Integrated Behavioral Health via Telemedicine Visit  05/07/2020 BRYONNA SUNDBY [id number]  Number of Integrated Behavioral Health visits: 1 Session Start time: 9:15***  Session End time: 10:15*** Total time: {IBH Total Time:21014050}  Referring Provider: *** Patient/Family location: *** [facility] Provider location: *** All persons participating in visit: *** Types of Service: {CHL AMB TYPE OF SERVICE:[phone]}  I connected with [name] and/or Cloey M Zell's {family members:20773} by Telephone  (Video is Surveyor, mining) and verified that I am speaking with the correct person using two identifiers.Discussed confidentiality: {YES/NO:21197}  I discussed the limitations of telemedicine and the availability of in person appointments.  Discussed there is a possibility of technology failure and discussed alternative modes of communication if that failure occurs.  I discussed that engaging in this telemedicine visit, they consent to the provision of behavioral healthcare and the services will be billed under their insurance.  Patient and/or legal guardian expressed understanding and consented to Telemedicine visit: {YES/NO:21197}  Presenting Concerns: Patient and/or family reports the following symptoms/concerns: *** Duration of problem: ***; Severity of problem: {Mild/Moderate/Severe:20260}  Patient and/or Family's Strengths/Protective Factors: {CHL AMB BH PROTECTIVE FACTORS:[phone]}  Goals Addressed: Patient will: 1.  Reduce symptoms of: {IBH Symptoms:21014056}  2.  Increase knowledge and/or ability of: {IBH Patient Tools:21014057}  3.  Demonstrate ability to: {IBH Goals:21014053}  Progress towards Goals: {CHL AMB BH PROGRESS TOWARDS GOALS:[id number]}  Interventions: Interventions utilized:  {IBH Interventions:21014054} Standardized Assessments completed: {IBH Screening Tools:21014051}  Patient and/or Family Response: ***  Assessment: Patient  currently experiencing ***.   Patient may benefit from ***.  Plan: 1. Follow up with behavioral health clinician on : *** 2. Behavioral recommendations: *** 3. Referral(s): {IBH Referrals:21014055}  I discussed the assessment and treatment plan with the patient and/or parent/guardian. They were provided an opportunity to ask questions and all were answered. They agreed with the plan and demonstrated an understanding of the instructions.   They were advised to call back or seek an in-person evaluation if the symptoms worsen or if the condition fails to improve as anticipated.  Valetta Close Allicia Culley, LCSW

## 2020-05-08 ENCOUNTER — Inpatient Hospital Stay (HOSPITAL_COMMUNITY)
Admission: AD | Admit: 2020-05-08 | Discharge: 2020-05-11 | DRG: 831 | Disposition: A | Discharge status: Home / Self Care | Payer: Medicaid Other | Attending: Family Medicine | Admitting: Family Medicine

## 2020-05-08 ENCOUNTER — Encounter (HOSPITAL_COMMUNITY): Payer: Self-pay | Admitting: Family Medicine

## 2020-05-08 ENCOUNTER — Encounter: Payer: Self-pay | Admitting: *Deleted

## 2020-05-08 ENCOUNTER — Ambulatory Visit: Payer: Medicaid Other | Admitting: *Deleted

## 2020-05-08 ENCOUNTER — Ambulatory Visit (HOSPITAL_BASED_OUTPATIENT_CLINIC_OR_DEPARTMENT_OTHER): Payer: Medicaid Other

## 2020-05-08 ENCOUNTER — Other Ambulatory Visit: Payer: Self-pay

## 2020-05-08 ENCOUNTER — Other Ambulatory Visit: Payer: Self-pay | Admitting: Family Medicine

## 2020-05-08 DIAGNOSIS — O0990 Supervision of high risk pregnancy, unspecified, unspecified trimester: Secondary | ICD-10-CM

## 2020-05-08 DIAGNOSIS — Z3686 Encounter for antenatal screening for cervical length: Secondary | ICD-10-CM | POA: Diagnosis not present

## 2020-05-08 DIAGNOSIS — O365920 Maternal care for other known or suspected poor fetal growth, second trimester, not applicable or unspecified: Secondary | ICD-10-CM | POA: Diagnosis present

## 2020-05-08 DIAGNOSIS — O365990 Maternal care for other known or suspected poor fetal growth, unspecified trimester, not applicable or unspecified: Secondary | ICD-10-CM | POA: Diagnosis present

## 2020-05-08 DIAGNOSIS — Z8759 Personal history of other complications of pregnancy, childbirth and the puerperium: Secondary | ICD-10-CM | POA: Diagnosis not present

## 2020-05-08 DIAGNOSIS — I10 Essential (primary) hypertension: Secondary | ICD-10-CM | POA: Insufficient documentation

## 2020-05-08 DIAGNOSIS — J4540 Moderate persistent asthma, uncomplicated: Secondary | ICD-10-CM | POA: Insufficient documentation

## 2020-05-08 DIAGNOSIS — F17210 Nicotine dependence, cigarettes, uncomplicated: Secondary | ICD-10-CM | POA: Diagnosis present

## 2020-05-08 DIAGNOSIS — Z23 Encounter for immunization: Secondary | ICD-10-CM

## 2020-05-08 DIAGNOSIS — O99512 Diseases of the respiratory system complicating pregnancy, second trimester: Secondary | ICD-10-CM | POA: Diagnosis present

## 2020-05-08 DIAGNOSIS — O3430 Maternal care for cervical incompetence, unspecified trimester: Secondary | ICD-10-CM | POA: Diagnosis present

## 2020-05-08 DIAGNOSIS — O09212 Supervision of pregnancy with history of pre-term labor, second trimester: Secondary | ICD-10-CM

## 2020-05-08 DIAGNOSIS — O4702 False labor before 37 completed weeks of gestation, second trimester: Secondary | ICD-10-CM | POA: Diagnosis present

## 2020-05-08 DIAGNOSIS — E782 Mixed hyperlipidemia: Secondary | ICD-10-CM | POA: Insufficient documentation

## 2020-05-08 DIAGNOSIS — F32A Depression, unspecified: Secondary | ICD-10-CM | POA: Diagnosis not present

## 2020-05-08 DIAGNOSIS — Z3A26 26 weeks gestation of pregnancy: Secondary | ICD-10-CM

## 2020-05-08 DIAGNOSIS — O30042 Twin pregnancy, dichorionic/diamniotic, second trimester: Secondary | ICD-10-CM

## 2020-05-08 DIAGNOSIS — O09292 Supervision of pregnancy with other poor reproductive or obstetric history, second trimester: Secondary | ICD-10-CM

## 2020-05-08 DIAGNOSIS — O0992 Supervision of high risk pregnancy, unspecified, second trimester: Secondary | ICD-10-CM

## 2020-05-08 DIAGNOSIS — O99332 Smoking (tobacco) complicating pregnancy, second trimester: Secondary | ICD-10-CM | POA: Diagnosis present

## 2020-05-08 DIAGNOSIS — O365921 Maternal care for other known or suspected poor fetal growth, second trimester, fetus 1: Secondary | ICD-10-CM

## 2020-05-08 DIAGNOSIS — O3432 Maternal care for cervical incompetence, second trimester: Secondary | ICD-10-CM | POA: Diagnosis present

## 2020-05-08 DIAGNOSIS — O99212 Obesity complicating pregnancy, second trimester: Secondary | ICD-10-CM | POA: Diagnosis present

## 2020-05-08 DIAGNOSIS — O09892 Supervision of other high risk pregnancies, second trimester: Secondary | ICD-10-CM | POA: Diagnosis not present

## 2020-05-08 DIAGNOSIS — O365922 Maternal care for other known or suspected poor fetal growth, second trimester, fetus 2: Secondary | ICD-10-CM

## 2020-05-08 DIAGNOSIS — E669 Obesity, unspecified: Secondary | ICD-10-CM | POA: Diagnosis not present

## 2020-05-08 DIAGNOSIS — O10919 Unspecified pre-existing hypertension complicating pregnancy, unspecified trimester: Secondary | ICD-10-CM | POA: Diagnosis present

## 2020-05-08 DIAGNOSIS — J45909 Unspecified asthma, uncomplicated: Secondary | ICD-10-CM | POA: Diagnosis present

## 2020-05-08 DIAGNOSIS — O98512 Other viral diseases complicating pregnancy, second trimester: Secondary | ICD-10-CM | POA: Diagnosis present

## 2020-05-08 DIAGNOSIS — O322XX2 Maternal care for transverse and oblique lie, fetus 2: Secondary | ICD-10-CM

## 2020-05-08 DIAGNOSIS — O09899 Supervision of other high risk pregnancies, unspecified trimester: Secondary | ICD-10-CM

## 2020-05-08 DIAGNOSIS — U071 COVID-19: Secondary | ICD-10-CM | POA: Diagnosis present

## 2020-05-08 DIAGNOSIS — O99342 Other mental disorders complicating pregnancy, second trimester: Secondary | ICD-10-CM | POA: Diagnosis not present

## 2020-05-08 DIAGNOSIS — O9852 Other viral diseases complicating childbirth: Secondary | ICD-10-CM | POA: Diagnosis not present

## 2020-05-08 DIAGNOSIS — O99210 Obesity complicating pregnancy, unspecified trimester: Secondary | ICD-10-CM | POA: Diagnosis present

## 2020-05-08 DIAGNOSIS — O10912 Unspecified pre-existing hypertension complicating pregnancy, second trimester: Secondary | ICD-10-CM | POA: Diagnosis not present

## 2020-05-08 DIAGNOSIS — F319 Bipolar disorder, unspecified: Secondary | ICD-10-CM | POA: Diagnosis not present

## 2020-05-08 LAB — TYPE AND SCREEN
ABO/RH(D): A POS
Antibody Screen: NEGATIVE

## 2020-05-08 MED ORDER — CALCIUM CARBONATE ANTACID 500 MG PO CHEW
2.0000 | CHEWABLE_TABLET | ORAL | Status: DC | PRN
  Filled 2020-05-08: qty 2

## 2020-05-08 MED ORDER — DOCUSATE SODIUM 100 MG PO CAPS
100.0000 mg | ORAL_CAPSULE | Freq: Every day | ORAL | Status: DC
  Administered 2020-05-09 – 2020-05-10 (×2): 100 mg via ORAL
  Filled 2020-05-08 (×3): qty 1

## 2020-05-08 MED ORDER — ACETAMINOPHEN 325 MG PO TABS
650.0000 mg | ORAL_TABLET | ORAL | Status: DC | PRN
  Administered 2020-05-09: 650 mg via ORAL
  Filled 2020-05-08: qty 2

## 2020-05-08 MED ORDER — PRENATAL MULTIVITAMIN CH
1.0000 | ORAL_TABLET | Freq: Every day | ORAL | Status: DC
  Administered 2020-05-09 – 2020-05-11 (×3): 1 via ORAL
  Filled 2020-05-08 (×3): qty 1

## 2020-05-08 MED ORDER — AMLODIPINE BESYLATE 10 MG PO TABS
10.0000 mg | ORAL_TABLET | Freq: Every day | ORAL | Status: DC
  Administered 2020-05-09: 10 mg via ORAL
  Filled 2020-05-08 (×2): qty 1

## 2020-05-08 MED ORDER — SERTRALINE HCL 50 MG PO TABS
150.0000 mg | ORAL_TABLET | Freq: Every day | ORAL | Status: DC
  Administered 2020-05-08 – 2020-05-10 (×3): 150 mg via ORAL
  Filled 2020-05-08 (×4): qty 3

## 2020-05-08 MED ORDER — BETAMETHASONE SOD PHOS & ACET 6 (3-3) MG/ML IJ SUSP
12.0000 mg | INTRAMUSCULAR | Status: AC
  Administered 2020-05-08 – 2020-05-09 (×2): 12 mg via INTRAMUSCULAR
  Filled 2020-05-08: qty 5

## 2020-05-08 MED ORDER — CYCLOBENZAPRINE HCL 10 MG PO TABS
5.0000 mg | ORAL_TABLET | Freq: Three times a day (TID) | ORAL | Status: DC | PRN
  Administered 2020-05-09 – 2020-05-10 (×5): 5 mg via ORAL
  Filled 2020-05-08 (×5): qty 1

## 2020-05-08 MED ORDER — ZOLPIDEM TARTRATE 5 MG PO TABS: 5.0000 mg | ORAL_TABLET | Freq: Every evening | ORAL | Status: DC | PRN

## 2020-05-08 MED ORDER — INFLUENZA VAC SPLIT QUAD 0.5 ML IM SUSY
0.5000 mL | PREFILLED_SYRINGE | INTRAMUSCULAR | Status: AC | PRN
  Administered 2020-05-11: 0.5 mL via INTRAMUSCULAR
  Filled 2020-05-08: qty 0.5

## 2020-05-08 NOTE — H&P (Signed)
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         Diaphragm:              Appears normal  Cerebellum:            Appears normal         Stomach:                Appears normal, left                                                                        sided  Posterior Fossa:       Appears normal         Abdomen:                Appears normal  Nuchal Fold:           Not applicable (>20    Abdominal Wall:         Appears nml (cord                         wks GA)                                        insert, abd wall)  Face:                  Not well visualized    Cord Vessels:           Appears normal (3  vessel cord)  Lips:                  Appears normal         Kidneys:                 Appear normal  Palate:                Not well visualized    Bladder:                Appears normal  Thoracic:              Appears normal         Spine:                  Appears normal  Heart:                 Appears normal         Upper Extremities:      Visualized                         (4CH, axis, and                         situs)  RVOT:                  Not well visualized    Lower Extremities:      Appears normal  Other:  Fetus appears to be a female.Heels visualized. VC, 3VV ---------------------------------------------------------------------- Doppler - Fetal Vessels (Fetus A)  Umbilical Artery   S/D     %tile      RI    %tile                             ADFV    RDFV   4.38       92    0.77       88                                No      No ---------------------------------------------------------------------- Fetal Evaluation (Fetus B)  Num Of Fetuses:         2  Fetal Heart Rate(bpm):  129  Cardiac Activity:       Observed  Fetal Lie:              Upper Fetus  Presentation:           Transverse, head to maternal left  Placenta:               Right lateral  P. Cord Insertion:      Not well visualized  Membrane Desc:      Dividing Membrane seen - Dichorionic.  Amniotic Fluid  AFI FV:      Within normal limits                              Largest Pocket(cm)                              4.4 ---------------------------------------------------------------------- Biometry (Fetus B)  BPD:      64.3  mm     G. Age:  26w 27d  30  %    CI:        74.81   %    70 - 86                                                          FL/HC:      19.2   %    18.6 - 20.4  HC:      235.9  mm     G. Age:  25w 4d         10  %    HC/AC:      1.16        1.04 - 1.22  AC:      203.6  mm     G. Age:  25w 0d          9  %    FL/BPD:     70.3   %    71 - 87  FL:       45.2  mm     G. Age:  25w 0d          7  %    FL/AC:      22.2   %    20 - 24  HUM:      39.7  mm     G. Age:  24w 1d        < 5  %  CER:      29.7  mm     G. Age:   26w 0d         45  %  LV:        4.6  mm  CM:          7  mm  Est. FW:     767  gm    1 lb 11 oz       6  %     FW Discordancy         3  % ---------------------------------------------------------------------- Gestational Age (Fetus B)  U/S Today:     25w 3d                                        EDD:   08/18/20  Best:          26w 2d     Det. By:  Marcella Dubs         EDD:   08/12/20                                      (12/26/19) ---------------------------------------------------------------------- Anatomy (Fetus B)  Cranium:               Appears normal         LVOT:                   Appears normal  Cavum:                 Appears normal         Aortic Arch:            Appears normal  Ventricles:            Appears normal         Ductal Arch:            Appears normal  Choroid Plexus:        Appears normal         Diaphragm:              Not well visualized  Cerebellum:            Appears normal         Stomach:                Appears normal, left                                                                        sided  Posterior Fossa:       Appears normal         Abdomen:                Appears normal  Nuchal Fold:           Not applicable (>20    Abdominal Wall:         Not well visualized                         wks GA)  Face:                  Appears normal         Cord Vessels:           Appears normal (3                         (orbits and profile)                           vessel cord)  Lips:                  Appears normal         Kidneys:                Appear normal  Palate:                Not well visualized    Bladder:                Appears normal  Thoracic:              Appears normal         Spine:                  Appears normal  Heart:                 Appears normal; EIF    Upper Extremities:      Visualized  RVOT:                  Appears normal         Lower Extremities:      Appears normal  Other:  Fetus appears to be a female. VC, 3VV visualized. Left foot/heel  visualized.  ---------------------------------------------------------------------- Doppler - Fetal Vessels (Fetus B)  Umbilical Artery   S/D     %tile      RI    %tile                             ADFV    RDFV   6.37   > 97.5    0.84   > 97.5                                No      No ---------------------------------------------------------------------- Cervix Uterus Adnexa  Cervix  No measurable cervix. See comments.  Uterus  No abnormality visualized.  Right Ovary  Within normal limits.  Left Ovary  Within normal limits.  Cul De Sac  No free fluid seen.  Adnexa  No abnormality visualized. ---------------------------------------------------------------------- Impression  Twin intrauterine pregnancy with features suggestive of  Diamniotic Dichorionic pregnancy.  Normal anatomy with good amniotic fluid and fetal movement  was observed in Twin A and B.  Suboptimal views of fetal anatomy was obtained as  documented above.  I reviewed the normal nature of today's ultrasound. Ms.  Gatliff conveyed that she is taking low dose aspirin for  preeclampsia prevention.  Today we observed FGR in both Twin A and B. In particular  Twin B had elevated UA Dopplers with no evidence of AEDF  or REDF. Twin A had normal UA Dopplers.  Secondly, a transvaginal exam was performed to assess  suspected a cervical funneling. We first performed a sterile  speculum exam that revealed a closed external os. However,  on TV there was funneling to the external os.  Ms. Ellegood  denied vaginal bleeding or pelvic pain. I discussed the  recommendation to be seen to MAU for monitoring, steroids  and possible tocolysis. In addition, she needs an NST as she  could not stay for monitoring given today's finding of IUGR.  She plan to return to MAU by  6:30 pm as she has children at  home to care for until her significant other arrives home.  If she is discharged we have scheduled her for weekly testing  with NST and repeat growth in 3 weeks.  We reviewed the sonographic  findings and limitations of  ultrasound. The potential risks associated with a twin  gestation were discussed.  This discussion included a review  of the increased risk of miscarriages, anomalies, preterm  labor, and/or delivery, malpresentation, delivery via cesarean  section, gestational diabetes, and/or preeclampsia.  With  regards to fetal risks, there is an increased risk for fetal  growth restriction of one or both twins, preterm labor, and  associated morbidity, and intrauterine fetal demise.  As noted  on today's exam.  We recommend growth scans every 4 weeks starting at 24  weeks with the initation of weekly antenatal testing in the  form of twice weekly NST or weekly BPP should abnormal  fetal growth or intertwin discordance of greater than 20-25%  is noted.  Following counseling, all questions were addressed. ---------------------------------------------------------------------- Recommendations  To MAU by 6:30 pm  NST, BMZ and possible tocolysis.  Weekly UA Dopplers.  NICU consultation.  I discussed with Dr. Adrian Blackwater who is aware of this plan made  with Ms. Janey Greaser ----------------------------------------------------------------------               Lin Landsman, MD Electronically Signed  Final Report   05/08/2020 06:04 pm ----------------------------------------------------------------------  [place] MFM OB DETAIL ADDL GEST +14 WK  Result Date: 05/08/2020 ----------------------------------------------------------------------  OBSTETRICS REPORT                       (Signed Final 05/08/2020 06:04 pm) ---------------------------------------------------------------------- Patient Info  ID #:       [id number]                          D.O.B.:  Dec 23, 1985 (34 yrs)  Name:       [name]               Visit Date: 05/08/2020 12:54 pm ---------------------------------------------------------------------- Performed By  Attending:        Lin Landsman      Ref. Address:     [address]                     [place]                                                             [place], [place]                                                             [address]  Performed By:     Eden Lathe BS      [facility]                                 [facility]  Referred By:      [facility] ---------------------------------------------------------------------- Orders  #  Description                           Code        Ordered By  1  [place] MFM OB DETAIL +14 WK               [id number]  MATTHEW ECKSTAT  2  [place] MFM OB DETAIL ADDL GEST            76811.02    MATTHEW ECKSTAT     +14 WK  3  [place] MFM UA CORD DOPPLER                76820.02    MATTHEW ECKSTAT  4  [place] MFM UA ADDL GEST                   76820.01    MATTHEW ECKSTAT  5  [place] MFM OB TRANSVAGINAL                16109.6     MATTHEW ECKSTAT ----------------------------------------------------------------------  #  Order #                     Accession #                Episode #  1  [id number]                   [id number]                 [id number]  2  [id number]                   [id number]                 [id number]  3  [id number]                   [id number]                 [id number]  4  [id number]                   [id number]                 [id number]  5  [id number]                   [id number]                 [id number] ---------------------------------------------------------------------- Indications  Antenatal screening for malformations          Z36.3  Encounter for cervical length                  Z36.86  Twin pregnancy, di/di, second trimester        O30.042  Short interval between pregancies, 2nd         O09.892  trimester  Poor obstetric history: Previous preterm       O09.219  delivery, antepartum x 2 (36 weeks)  Poor obstetric history: Previous fetal growth   O09.299  restriction (FGR)  Cervical insufficiency, 2nd                    O34.32  [redacted] weeks gestation of pregnancy                Z3A.26 ---------------------------------------------------------------------- Fetal Evaluation (Fetus A)  Num Of Fetuses:         2  Fetal Heart Rate(bpm):  136  Cardiac Activity:       Observed  Fetal Lie:              Lower Fetus  Presentation:           Variable  Placenta:  Anterior  P. Cord Insertion:      Visualized  Membrane Desc:      Dividing Membrane seen - Dichorionic.  Amniotic Fluid  AFI FV:      Within normal limits                              Largest Pocket(cm)                              4.4 ---------------------------------------------------------------------- Biometry (Fetus A)  BPD:      64.4  mm     G. Age:  26w 0d         31  %    CI:        74.68   %    70 - 86                                                          FL/HC:      18.3   %    18.6 - 20.4  HC:      236.5  mm     G. Age:  25w 5d         11  %    HC/AC:      1.11        1.04 - 1.22  AC:      213.7  mm     G. Age:  25w 6d         29  %    FL/BPD:     67.1   %    71 - 87  FL:       43.2  mm     G. Age:  24w 1d        1.6  %    FL/AC:      20.2   %    20 - 24  HUM:      41.2  mm     G. Age:  25w 0d         12  %  CER:      30.2  mm     G. Age:  26w 2d         53  %  LV:        4.8  mm  CM:        7.2  mm  Est. FW:     788  gm    1 lb 12 oz       9  %     FW Discordancy      0 \ 3 % ---------------------------------------------------------------------- OB History  Gravidity:    4         Term:   1        Prem:   2        SAB:   0  TOP:          0       Ectopic:  0        Living: 3 ---------------------------------------------------------------------- Gestational Age (Fetus A)  U/S Today:     25w 3d  EDD:   08/18/20  Best:          [place] 2d     Det. ByMarcella Dubs         EDD:   08/12/20                                      (12/26/19)  ---------------------------------------------------------------------- Anatomy (Fetus A)  Cranium:               Appears normal         LVOT:                   Appears normal  Cavum:                 Appears normal         Aortic Arch:            Not well visualized  Ventricles:            Appears normal         Ductal Arch:            Not well visualized  Choroid Plexus:        Appears normal         Diaphragm:              Appears normal  Cerebellum:            Appears normal         Stomach:                Appears normal, left                                                                        sided  Posterior Fossa:       Appears normal         Abdomen:                Appears normal  Nuchal Fold:           Not applicable (>20    Abdominal Wall:         Appears nml (cord                         wks GA)                                        insert, abd wall)  Face:                  Not well visualized    Cord Vessels:           Appears normal (3  vessel cord)  Lips:                  Appears normal         Kidneys:                Appear normal  Palate:                Not well visualized    Bladder:                Appears normal  Thoracic:              Appears normal         Spine:                  Appears normal  Heart:                 Appears normal         Upper Extremities:      Visualized                         (4CH, axis, and                         situs)  RVOT:                  Not well visualized    Lower Extremities:      Appears normal  Other:  Fetus appears to be a female.Heels visualized. VC, 3VV ---------------------------------------------------------------------- Doppler - Fetal Vessels (Fetus A)  Umbilical Artery   S/D     %tile      RI    %tile                             ADFV    RDFV   4.38       92    0.77       88                                No      No ----------------------------------------------------------------------  Fetal Evaluation (Fetus B)  Num Of Fetuses:         2  Fetal Heart Rate(bpm):  129  Cardiac Activity:       Observed  Fetal Lie:              Upper Fetus  Presentation:           Transverse, head to maternal left  Placenta:               Right lateral  P. Cord Insertion:      Not well visualized  Membrane Desc:      Dividing Membrane seen - Dichorionic.  Amniotic Fluid  AFI FV:      Within normal limits                              Largest Pocket(cm)                              4.4 ---------------------------------------------------------------------- Biometry (Fetus B)  BPD:      64.3  mm     G. Age:  26w 78d  30  %    CI:        74.81   %    70 - 86                                                          FL/HC:      19.2   %    18.6 - 20.4  HC:      235.9  mm     G. Age:  25w 4d         10  %    HC/AC:      1.16        1.04 - 1.22  AC:      203.6  mm     G. Age:  25w 0d          9  %    FL/BPD:     70.3   %    71 - 87  FL:       45.2  mm     G. Age:  25w 0d          7  %    FL/AC:      22.2   %    20 - 24  HUM:      39.7  mm     G. Age:  24w 1d        < 5  %  CER:      29.7  mm     G. Age:  26w 0d         45  %  LV:        4.6  mm  CM:          7  mm  Est. FW:     767  gm    1 lb 11 oz       6  %     FW Discordancy         3  % ---------------------------------------------------------------------- Gestational Age (Fetus B)  U/S Today:     25w 3d                                        EDD:   08/18/20  Best:          26w 2d     Det. By:  Marcella Dubs         EDD:   08/12/20                                      (12/26/19) ---------------------------------------------------------------------- Anatomy (Fetus B)  Cranium:               Appears normal         LVOT:                   Appears normal  Cavum:                 Appears normal         Aortic Arch:            Appears normal  Ventricles:            Appears normal         Ductal Arch:            Appears normal  Choroid Plexus:        Appears normal          Diaphragm:              Not well visualized  Cerebellum:            Appears normal         Stomach:                Appears normal, left                                                                        sided  Posterior Fossa:       Appears normal         Abdomen:                Appears normal  Nuchal Fold:           Not applicable (>20    Abdominal Wall:         Not well visualized                         wks GA)  Face:                  Appears normal         Cord Vessels:           Appears normal (3                         (orbits and profile)                           vessel cord)  Lips:                  Appears normal         Kidneys:                Appear normal  Palate:                Not well visualized    Bladder:                Appears normal  Thoracic:              Appears normal         Spine:                  Appears normal  Heart:                 Appears normal; EIF    Upper Extremities:      Visualized  RVOT:                  Appears normal         Lower Extremities:      Appears normal  Other:  Fetus appears to be a female. VC, 3VV visualized. Left foot/heel  visualized. ---------------------------------------------------------------------- Doppler - Fetal Vessels (Fetus B)  Umbilical Artery   S/D     %tile      RI    %tile                             ADFV    RDFV   6.37   > 97.5    0.84   > 97.5                                No      No ---------------------------------------------------------------------- Cervix Uterus Adnexa  Cervix  No measurable cervix. See comments.  Uterus  No abnormality visualized.  Right Ovary  Within normal limits.  Left Ovary  Within normal limits.  Cul De Sac  No free fluid seen.  Adnexa  No abnormality visualized. ---------------------------------------------------------------------- Impression  Twin intrauterine pregnancy with features suggestive of  Diamniotic Dichorionic pregnancy.  Normal anatomy with good amniotic fluid and fetal movement  was observed  in Twin A and B.  Suboptimal views of fetal anatomy was obtained as  documented above.  I reviewed the normal nature of today's ultrasound. Ms.  Abeyta conveyed that she is taking low dose aspirin for  preeclampsia prevention.  Today we observed FGR in both Twin A and B. In particular  Twin B had elevated UA Dopplers with no evidence of AEDF  or REDF. Twin A had normal UA Dopplers.  Secondly, a transvaginal exam was performed to assess  suspected a cervical funneling. We first performed a sterile  speculum exam that revealed a closed external os. However,  on TV there was funneling to the external os.  Ms. Mcdonald  denied vaginal bleeding or pelvic pain. I discussed the  recommendation to be seen to MAU for monitoring, steroids  and possible tocolysis. In addition, she needs an NST as she  could not stay for monitoring given today's finding of IUGR.  She plan to return to MAU by  6:30 pm as she has children at  home to care for until her significant other arrives home.  If she is discharged we have scheduled her for weekly testing  with NST and repeat growth in 3 weeks.  We reviewed the sonographic findings and limitations of  ultrasound. The potential risks associated with a twin  gestation were discussed.  This discussion included a review  of the increased risk of miscarriages, anomalies, preterm  labor, and/or delivery, malpresentation, delivery via cesarean  section, gestational diabetes, and/or preeclampsia.  With  regards to fetal risks, there is an increased risk for fetal  growth restriction of one or both twins, preterm labor, and  associated morbidity, and intrauterine fetal demise.  As noted  on today's exam.  We recommend growth scans every 4 weeks starting at 24  weeks with the initation of weekly antenatal testing in the  form of twice weekly NST or weekly BPP should abnormal  fetal growth or intertwin discordance of greater than 20-25%  is noted.  Following counseling, all questions were addressed.  ---------------------------------------------------------------------- Recommendations  To MAU by 6:30 pm  NST, BMZ and possible tocolysis.  Weekly UA Dopplers.  NICU consultation.  I discussed with Dr. Adrian Blackwater who is aware of this plan made  with Ms. Janey Greaser ----------------------------------------------------------------------               Lin Landsman, MD Electronically Signed  Final Report   05/08/2020 06:04 pm ----------------------------------------------------------------------  [place] MFM UA ADDL GEST  Result Date: 05/08/2020 ----------------------------------------------------------------------  OBSTETRICS REPORT                       (Signed Final 05/08/2020 06:04 pm) ---------------------------------------------------------------------- Patient Info  ID #:       [id number]                          D.O.B.:  Dec 10, 1985 (34 yrs)  Name:       [name]               Visit Date: 05/08/2020 12:54 pm ---------------------------------------------------------------------- Performed By  Attending:        Lin Landsman      Ref. Address:     [address]                    [place]                                                             [place], [place]                                                             [address]  Performed By:     Eden Lathe BS      Location[facility] RV[facility]                                 [facility]  Referred By:      [facility] ---------------------------------------------------------------------- Orders  #  Description                           Code        Ordered By  1  [place] MFM OB DETAIL +14 WK               76811.01    MATTHEW  ECKSTAT  2  [place] MFM OB DETAIL ADDL GEST            76811.02    MATTHEW ECKSTAT     +14 WK  3  US MFM UA CORD DOPPLER                 76820.02    MATTHEW ECKSTAT  4  US MFM UA ADDL GEST                   76820.01    MATTHEW ECKSTAT  5  US MFM OB TRANSVAGINAL                16109.676817.2     MATTHEW ECKSTAT ----------------------------------------------------------------------  #  Order #                     Accession #                Episode #  1  [id number]                   [id number]                 [id number]  2  [id number]                   [id number]                 [id number]  3  [id number]                   [id number]                 [id number]  4  [id number]                   [id number]                 [id number]  5  [id number]                   [phone]                 [id number] ---------------------------------------------------------------------- Indications  Antenatal screening for malformations          Z36.3  Encounter for cervical length                  Z36.86  Twin pregnancy, di/di, second trimester        O30.042  Short interval between pregancies, 2nd         O09.892  trimester  Poor obstetric history: Previous preterm       O09.219  delivery, antepartum x 2 (36 weeks)  Poor obstetric history: Previous fetal growth  O09.299  restriction (FGR)  Cervical insufficiency, 2nd                    O34.32  [redacted] weeks gestation of pregnancy                Z3A.26 ---------------------------------------------------------------------- Fetal Evaluation (Fetus A)  Num Of Fetuses:         2  Fetal Heart Rate(bpm):  136  Cardiac Activity:       Observed  Fetal Lie:              Lower Fetus  Presentation:           Variable  Placenta:               Anterior  P. Cord Insertion:      Visualized  Membrane Desc:      Dividing Membrane seen - Dichorionic.  Amniotic Fluid  AFI FV:      Within normal limits                              Largest Pocket(cm)                              4.4 ---------------------------------------------------------------------- Biometry (Fetus A)  BPD:      64.4  mm     G. Age:  26w 0d         31  %    CI:        74.68   %    70 - 86                                                           FL/HC:      18.3   %    18.6 - 20.4  HC:      236.5  mm     G. Age:  25w 5d         11  %    HC/AC:      1.11        1.04 - 1.22  AC:      213.7  mm     G. Age:  25w 6d         29  %    FL/BPD:     67.1   %    71 - 87  FL:       43.2  mm     G. Age:  24w 1d        1.6  %    FL/AC:      20.2   %    20 - 24  HUM:      41.2  mm     G. Age:  25w 0d         12  %  CER:      30.2  mm     G. Age:  26w 2d         53  %  LV:        4.8  mm  CM:        7.2  mm  Est. FW:     788  gm    1 lb 12 oz       9  %     FW Discordancy      0 \ 3 % ---------------------------------------------------------------------- OB History  Gravidity:    4         Term:   1        Prem:   2        SAB:   0  TOP:          0       Ectopic:  0        Living: 3 ---------------------------------------------------------------------- Gestational Age (Fetus A)  U/S Today:     25w 3d  EDD:   08/18/20  Best:          Altamese Cabal 2d     Det. ByMarcella Dubs         EDD:   08/12/20                                      (12/26/19) ---------------------------------------------------------------------- Anatomy (Fetus A)  Cranium:               Appears normal         LVOT:                   Appears normal  Cavum:                 Appears normal         Aortic Arch:            Not well visualized  Ventricles:            Appears normal         Ductal Arch:            Not well visualized  Choroid Plexus:        Appears normal         Diaphragm:              Appears normal  Cerebellum:            Appears normal         Stomach:                Appears normal, left                                                                        sided  Posterior Fossa:       Appears normal         Abdomen:                Appears normal  Nuchal Fold:           Not applicable (>20    Abdominal Wall:         Appears nml (cord                         wks GA)                                         insert, abd wall)  Face:                  Not well visualized    Cord Vessels:           Appears normal (3  vessel cord)  Lips:                  Appears normal         Kidneys:                Appear normal  Palate:                Not well visualized    Bladder:                Appears normal  Thoracic:              Appears normal         Spine:                  Appears normal  Heart:                 Appears normal         Upper Extremities:      Visualized                         (4CH, axis, and                         situs)  RVOT:                  Not well visualized    Lower Extremities:      Appears normal  Other:  Fetus appears to be a female.Heels visualized. VC, 3VV ---------------------------------------------------------------------- Doppler - Fetal Vessels (Fetus A)  Umbilical Artery   S/D     %tile      RI    %tile                             ADFV    RDFV   4.38       92    0.77       88                                No      No ---------------------------------------------------------------------- Fetal Evaluation (Fetus B)  Num Of Fetuses:         2  Fetal Heart Rate(bpm):  129  Cardiac Activity:       Observed  Fetal Lie:              Upper Fetus  Presentation:           Transverse, head to maternal left  Placenta:               Right lateral  P. Cord Insertion:      Not well visualized  Membrane Desc:      Dividing Membrane seen - Dichorionic.  Amniotic Fluid  AFI FV:      Within normal limits                              Largest Pocket(cm)                              4.4 ---------------------------------------------------------------------- Biometry (Fetus B)  BPD:      64.3  mm     G. Age:  26w 73d  30  %    CI:        74.81   %    70 - 86                                                          FL/HC:      19.2   %    18.6 - 20.4  HC:      235.9  mm     G. Age:  25w 4d         10  %    HC/AC:      1.16        1.04 - 1.22  AC:       203.6  mm     G. Age:  25w 0d          9  %    FL/BPD:     70.3   %    71 - 87  FL:       45.2  mm     G. Age:  25w 0d          7  %    FL/AC:      22.2   %    20 - 24  HUM:      39.7  mm     G. Age:  24w 1d        < 5  %  CER:      29.7  mm     G. Age:  26w 0d         45  %  LV:        4.6  mm  CM:          7  mm  Est. FW:     767  gm    1 lb 11 oz       6  %     FW Discordancy         3  % ---------------------------------------------------------------------- Gestational Age (Fetus B)  U/S Today:     25w 3d                                        EDD:   08/18/20  Best:          26w 2d     Det. By:  Marcella DubsEarly Ultrasound         EDD:   08/12/20                                      (12/26/19) ---------------------------------------------------------------------- Anatomy (Fetus B)  Cranium:               Appears normal         LVOT:                   Appears normal  Cavum:                 Appears normal         Aortic Arch:            Appears normal  Ventricles:            Appears normal         Ductal Arch:            Appears normal  Choroid Plexus:        Appears normal         Diaphragm:              Not well visualized  Cerebellum:            Appears normal         Stomach:                Appears normal, left                                                                        sided  Posterior Fossa:       Appears normal         Abdomen:                Appears normal  Nuchal Fold:           Not applicable (>20    Abdominal Wall:         Not well visualized                         wks GA)  Face:                  Appears normal         Cord Vessels:           Appears normal (3                         (orbits and profile)                           vessel cord)  Lips:                  Appears normal         Kidneys:                Appear normal  Palate:                Not well visualized    Bladder:                Appears normal  Thoracic:              Appears normal         Spine:                  Appears normal   Heart:                 Appears normal; EIF    Upper Extremities:      Visualized  RVOT:                  Appears normal         Lower Extremities:      Appears normal  Other:  Fetus appears to be a female. VC, 3VV visualized. Left foot/heel  visualized. ---------------------------------------------------------------------- Doppler - Fetal Vessels (Fetus B)  Umbilical Artery   S/D     %tile      RI    %tile                             ADFV    RDFV   6.37   > 97.5    0.84   > 97.5                                No      No ---------------------------------------------------------------------- Cervix Uterus Adnexa  Cervix  No measurable cervix. See comments.  Uterus  No abnormality visualized.  Right Ovary  Within normal limits.  Left Ovary  Within normal limits.  Cul De Sac  No free fluid seen.  Adnexa  No abnormality visualized. ---------------------------------------------------------------------- Impression  Twin intrauterine pregnancy with features suggestive of  Diamniotic Dichorionic pregnancy.  Normal anatomy with good amniotic fluid and fetal movement  was observed in Twin A and B.  Suboptimal views of fetal anatomy was obtained as  documented above.  I reviewed the normal nature of today's ultrasound. Ms.  Waltz conveyed that she is taking low dose aspirin for  preeclampsia prevention.  Today we observed FGR in both Twin A and B. In particular  Twin B had elevated UA Dopplers with no evidence of AEDF  or REDF. Twin A had normal UA Dopplers.  Secondly, a transvaginal exam was performed to assess  suspected a cervical funneling. We first performed a sterile  speculum exam that revealed a closed external os. However,  on TV there was funneling to the external os.  Ms. Favor  denied vaginal bleeding or pelvic pain. I discussed the  recommendation to be seen to MAU for monitoring, steroids  and possible tocolysis. In addition, she needs an NST as she  could not stay for monitoring given today's finding of  IUGR.  She plan to return to MAU by  6:30 pm as she has children at  home to care for until her significant other arrives home.  If she is discharged we have scheduled her for weekly testing  with NST and repeat growth in 3 weeks.  We reviewed the sonographic findings and limitations of  ultrasound. The potential risks associated with a twin  gestation were discussed.  This discussion included a review  of the increased risk of miscarriages, anomalies, preterm  labor, and/or delivery, malpresentation, delivery via cesarean  section, gestational diabetes, and/or preeclampsia.  With  regards to fetal risks, there is an increased risk for fetal  growth restriction of one or both twins, preterm labor, and  associated morbidity, and intrauterine fetal demise.  As noted  on today's exam.  We recommend growth scans every 4 weeks starting at 24  weeks with the initation of weekly antenatal testing in the  form of twice weekly NST or weekly BPP should abnormal  fetal growth or intertwin discordance of greater than 20-25%  is noted.  Following counseling, all questions were addressed. ---------------------------------------------------------------------- Recommendations  To MAU by 6:30 pm  NST, BMZ and possible tocolysis.  Weekly UA Dopplers.  NICU consultation.  I discussed with Dr. Adrian Blackwater who is aware of this plan made  with Ms. Janey Greaser ----------------------------------------------------------------------               Lin Landsman, MD Electronically Signed Final  Report   05/08/2020 06:04 pm ----------------------------------------------------------------------  [place] MFM UA CORD DOPPLER  Result Date: 05/08/2020 ----------------------------------------------------------------------  OBSTETRICS REPORT                       (Signed Final 05/08/2020 06:04 pm) ---------------------------------------------------------------------- Patient Info  ID #:       [id number]                          D.O.B.:  12/23/1985 (34 yrs)   Name:       CHERISH RUNDE [facility]               Visit Date: 05/08/2020 12:54 pm ---------------------------------------------------------------------- Performed By  Attending:        Lin Landsman      Ref. Address:     [address]                    [place]                                                             [place], [place]                                                             [address]  Performed By:     Eden Lathe BS      [facility]  Referred By:      [facility] ---------------------------------------------------------------------- Orders  #  Description                           Code        Ordered By  1  [place] MFM OB DETAIL +14 WK               76811.01    MATTHEW  ECKSTAT  2  [place] MFM OB DETAIL ADDL GEST            76811.02    MATTHEW ECKSTAT     +14 WK  3  [place] MFM UA CORD DOPPLER                76820.02    MATTHEW ECKSTAT  4  [place] MFM UA ADDL GEST                   76820.01    MATTHEW ECKSTAT  5  [place] MFM OB TRANSVAGINAL                16109.6     MATTHEW ECKSTAT ----------------------------------------------------------------------  #  Order #                     Accession #                Episode #  1  [id number]                   [id number]                 [id number]  2  [id number]                   [id number]                 [id number]  3  [id number]                   [id number]                 [id number]  4  [id number]                   [id number]                 [id number]  5  [id number]                   [id number]                 [id number] ---------------------------------------------------------------------- Indications  Antenatal screening for malformations          Z36.3  Encounter for cervical length                  Z36.86  Twin  pregnancy, di/di, second trimester        O30.042  Short interval between pregancies, 2nd         O09.892  trimester  Poor obstetric history: Previous preterm       O09.219  delivery, antepartum x 2 (36 weeks)  Poor obstetric history: Previous fetal growth  O09.299  restriction (FGR)  Cervical insufficiency, 2nd                    O34.32  [redacted] weeks gestation of pregnancy                Z3A.26 ---------------------------------------------------------------------- Fetal Evaluation (Fetus A)  Num Of Fetuses:         2  Fetal Heart Rate(bpm):  136  Cardiac Activity:       Observed  Fetal Lie:              Lower Fetus  Presentation:           Variable  Placenta:               Anterior  P. Cord Insertion:      Visualized  Membrane Desc:      Dividing Membrane seen - Dichorionic.  Amniotic Fluid  AFI FV:      Within normal limits                              Largest Pocket(cm)                              4.4 ---------------------------------------------------------------------- Biometry (Fetus A)  BPD:      64.4  mm     G. Age:  26w 0d         31  %    CI:        74.68   %    70 - 86                                                          FL/HC:      18.3   %    18.6 - 20.4  HC:      236.5  mm     G. Age:  25w 5d         11  %    HC/AC:      1.11        1.04 - 1.22  AC:      213.7  mm     G. Age:  25w 6d         29  %    FL/BPD:     67.1   %    71 - 87  FL:       43.2  mm     G. Age:  24w 1d        1.6  %    FL/AC:      20.2   %    20 - 24  HUM:      41.2  mm     G. Age:  25w 0d         12  %  CER:      30.2  mm     G. Age:  26w 2d         53  %  LV:        4.8  mm  CM:        7.2  mm  Est. FW:     788  gm    1 lb 12 oz       9  %     FW Discordancy      0 \ 3 % ---------------------------------------------------------------------- OB History  Gravidity:    4         Term:   1        Prem:   2        SAB:   0  TOP:          0       Ectopic:  0        Living: 3  ---------------------------------------------------------------------- Gestational Age (Fetus A)  U/S Today:     25w 3d  EDD:   08/18/20  Best:          [place]al 2d     Det. ByMarcella Dubs         EDD:   08/12/20                                      (12/26/19) ---------------------------------------------------------------------- Anatomy (Fetus A)  Cranium:               Appears normal         LVOT:                   Appears normal  Cavum:                 Appears normal         Aortic Arch:            Not well visualized  Ventricles:            Appears normal         Ductal Arch:            Not well visualized  Choroid Plexus:        Appears normal         Diaphragm:              Appears normal  Cerebellum:            Appears normal         Stomach:                Appears normal, left                                                                        sided  Posterior Fossa:       Appears normal         Abdomen:                Appears normal  Nuchal Fold:           Not applicable (>20    Abdominal Wall:         Appears nml (cord                         wks GA)                                        insert, abd wall)  Face:                  Not well visualized    Cord Vessels:           Appears normal (3  vessel cord)  Lips:                  Appears normal         Kidneys:                Appear normal  Palate:                Not well visualized    Bladder:                Appears normal  Thoracic:              Appears normal         Spine:                  Appears normal  Heart:                 Appears normal         Upper Extremities:      Visualized                         (4CH, axis, and                         situs)  RVOT:                  Not well visualized    Lower Extremities:      Appears normal  Other:  Fetus appears to be a female.Heels visualized. VC, 3VV  ---------------------------------------------------------------------- Doppler - Fetal Vessels (Fetus A)  Umbilical Artery   S/D     %tile      RI    %tile                             ADFV    RDFV   4.38       92    0.77       88                                No      No ---------------------------------------------------------------------- Fetal Evaluation (Fetus B)  Num Of Fetuses:         2  Fetal Heart Rate(bpm):  129  Cardiac Activity:       Observed  Fetal Lie:              Upper Fetus  Presentation:           Transverse, head to maternal left  Placenta:               Right lateral  P. Cord Insertion:      Not well visualized  Membrane Desc:      Dividing Membrane seen - Dichorionic.  Amniotic Fluid  AFI FV:      Within normal limits                              Largest Pocket(cm)                              4.4 ---------------------------------------------------------------------- Biometry (Fetus B)  BPD:      64.3  mm     G. Age:  26w 39d  30  %    CI:        74.81   %    70 - 86                                                          FL/HC:      19.2   %    18.6 - 20.4  HC:      235.9  mm     G. Age:  25w 4d         10  %    HC/AC:      1.16        1.04 - 1.22  AC:      203.6  mm     G. Age:  25w 0d          9  %    FL/BPD:     70.3   %    71 - 87  FL:       45.2  mm     G. Age:  25w 0d          7  %    FL/AC:      22.2   %    20 - 24  HUM:      39.7  mm     G. Age:  24w 1d        < 5  %  CER:      29.7  mm     G. Age:  26w 0d         45  %  LV:        4.6  mm  CM:          7  mm  Est. FW:     767  gm    1 lb 11 oz       6  %     FW Discordancy         3  % ---------------------------------------------------------------------- Gestational Age (Fetus B)  U/S Today:     25w 3d                                        EDD:   08/18/20  Best:          26w 2d     Det. By:  Marcella Dubs         EDD:   08/12/20                                      (12/26/19)  ---------------------------------------------------------------------- Anatomy (Fetus B)  Cranium:               Appears normal         LVOT:                   Appears normal  Cavum:                 Appears normal         Aortic Arch:            Appears normal  Ventricles:            Appears normal         Ductal Arch:            Appears normal  Choroid Plexus:        Appears normal         Diaphragm:              Not well visualized  Cerebellum:            Appears normal         Stomach:                Appears normal, left                                                                        sided  Posterior Fossa:       Appears normal         Abdomen:                Appears normal  Nuchal Fold:           Not applicable (>20    Abdominal Wall:         Not well visualized                         wks GA)  Face:                  Appears normal         Cord Vessels:           Appears normal (3                         (orbits and profile)                           vessel cord)  Lips:                  Appears normal         Kidneys:                Appear normal  Palate:                Not well visualized    Bladder:                Appears normal  Thoracic:              Appears normal         Spine:                  Appears normal  Heart:                 Appears normal; EIF    Upper Extremities:      Visualized  RVOT:                  Appears normal         Lower Extremities:      Appears normal  Other:  Fetus appears to be a female. VC, 3VV visualized. Left foot/heel  visualized. ---------------------------------------------------------------------- Doppler - Fetal Vessels (Fetus B)  Umbilical Artery   S/D     %tile      RI    %tile                             ADFV    RDFV   6.37   > 97.5    0.84   > 97.5                                No      No ---------------------------------------------------------------------- Cervix Uterus Adnexa  Cervix  No measurable cervix. See comments.  Uterus  No abnormality  visualized.  Right Ovary  Within normal limits.  Left Ovary  Within normal limits.  Cul De Sac  No free fluid seen.  Adnexa  No abnormality visualized. ---------------------------------------------------------------------- Impression  Twin intrauterine pregnancy with features suggestive of  Diamniotic Dichorionic pregnancy.  Normal anatomy with good amniotic fluid and fetal movement  was observed in Twin A and B.  Suboptimal views of fetal anatomy was obtained as  documented above.  I reviewed the normal nature of today's ultrasound. Ms.  Arvizu conveyed that she is taking low dose aspirin for  preeclampsia prevention.  Today we observed FGR in both Twin A and B. In particular  Twin B had elevated UA Dopplers with no evidence of AEDF  or REDF. Twin A had normal UA Dopplers.  Secondly, a transvaginal exam was performed to assess  suspected a cervical funneling. We first performed a sterile  speculum exam that revealed a closed external os. However,  on TV there was funneling to the external os.  Ms. Segundo  denied vaginal bleeding or pelvic pain. I discussed the  recommendation to be seen to MAU for monitoring, steroids  and possible tocolysis. In addition, she needs an NST as she  could not stay for monitoring given today's finding of IUGR.  She plan to return to MAU by  6:30 pm as she has children at  home to care for until her significant other arrives home.  If she is discharged we have scheduled her for weekly testing  with NST and repeat growth in 3 weeks.  We reviewed the sonographic findings and limitations of  ultrasound. The potential risks associated with a twin  gestation were discussed.  This discussion included a review  of the increased risk of miscarriages, anomalies, preterm  labor, and/or delivery, malpresentation, delivery via cesarean  section, gestational diabetes, and/or preeclampsia.  With  regards to fetal risks, there is an increased risk for fetal  growth restriction of one or both twins,  preterm labor, and  associated morbidity, and intrauterine fetal demise.  As noted  on today's exam.  We recommend growth scans every 4 weeks starting at 24  weeks with the initation of weekly antenatal testing in the  form of twice weekly NST or weekly BPP should abnormal  fetal growth or intertwin discordance of greater than 20-25%  is noted.  Following counseling, all questions were addressed. ---------------------------------------------------------------------- Recommendations  To MAU by 6:30 pm  NST, BMZ and possible tocolysis.  Weekly UA Dopplers.  NICU consultation.  I discussed with Dr. Adrian Blackwater who is aware of this plan made  with Ms. Janey Greaser ----------------------------------------------------------------------               Lin Landsman, MD Electronically Signed  Final Report   05/08/2020 06:04 pm ----------------------------------------------------------------------    Assessment and Plan: Patient Active Problem List   Diagnosis Date Noted  . Pregnancy affected by fetal growth restriction 05/08/2020  . Cervical insufficiency during pregnancy, antepartum 05/08/2020  . 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 Supervision of high risk pregnancy, antepartum 01/13/2020  . Obesity in pregnancy 03/27/2019  . Unwanted fertility 03/27/2019  . History of preterm delivery, currently pregnant 11/05/2018  . Chronic hypertension affecting pregnancy 11/05/2018  . Nexplanon in place 11/05/2018  . Marijuana abuse 11/05/2018  . Tobacco abuse 11/05/2018  . Antepartum anemia 11/05/2018  . Bipolar 2 disorder, major depressive episode (HCC) 11/14/2017  . 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 Substance induced mood disorder (HCC) 01/13/2009  . DEPRESSION 01/13/2009  . Asthma 01/13/2009   Admit to Antenatal Routine antenatal care  # Mercie Eon twins with FGR x2 (elevated dopplers on Twin B) - MFM consult for timing of delivery - NST q  shift   #Cervical Insufficiency - Funneling to external os on [place], closed via MFM speculum exam - cervical exam deferred - Recent GC/CT negative on 1/5 - UCx ordered - UDS ordered  #Fetal well being - BMZ - #1 in MAU, plan for second in 12 hr - NICU consult placed  #Pregnancy supervision - strongly desires BTS, recommend signing medicaid papers today - Pastoral care consult due to stress  - SW consult due to insurance coverage and inability to get medications. Also has significant depression.   #Depression: continue zoloft 150 mg daily  #Dispo: admit, likely 2 night stay perhaps longer depending on clinic course  Federico Flake, MD, MPH, ABFM Attending Physician Faculty Practice- [facility]

## 2020-05-08 NOTE — MAU Note (Signed)
Pt sent from MFM for prolonged monitoring.  Denies any pain or discomfort. Reports good movement from babies.

## 2020-05-08 NOTE — Progress Notes (Signed)
Pt tearful, when asked if anything was wrong-stated" I'm just ready to get this over with." When asked if she meant ultrasound, stated "everything."

## 2020-05-09 DIAGNOSIS — F32A Depression, unspecified: Secondary | ICD-10-CM

## 2020-05-09 DIAGNOSIS — O99342 Other mental disorders complicating pregnancy, second trimester: Secondary | ICD-10-CM

## 2020-05-09 DIAGNOSIS — O365920 Maternal care for other known or suspected poor fetal growth, second trimester, not applicable or unspecified: Secondary | ICD-10-CM

## 2020-05-09 DIAGNOSIS — O98512 Other viral diseases complicating pregnancy, second trimester: Secondary | ICD-10-CM

## 2020-05-09 DIAGNOSIS — O3432 Maternal care for cervical incompetence, second trimester: Secondary | ICD-10-CM

## 2020-05-09 DIAGNOSIS — O30042 Twin pregnancy, dichorionic/diamniotic, second trimester: Secondary | ICD-10-CM

## 2020-05-09 DIAGNOSIS — U071 COVID-19: Secondary | ICD-10-CM

## 2020-05-09 DIAGNOSIS — O99512 Diseases of the respiratory system complicating pregnancy, second trimester: Secondary | ICD-10-CM

## 2020-05-09 DIAGNOSIS — F319 Bipolar disorder, unspecified: Secondary | ICD-10-CM

## 2020-05-09 DIAGNOSIS — J45909 Unspecified asthma, uncomplicated: Secondary | ICD-10-CM

## 2020-05-09 DIAGNOSIS — O10912 Unspecified pre-existing hypertension complicating pregnancy, second trimester: Secondary | ICD-10-CM

## 2020-05-09 DIAGNOSIS — Z3A26 26 weeks gestation of pregnancy: Secondary | ICD-10-CM

## 2020-05-09 LAB — RAPID URINE DRUG SCREEN, HOSP PERFORMED
Amphetamines: NOT DETECTED
Barbiturates: NOT DETECTED
Benzodiazepines: NOT DETECTED
Cocaine: NOT DETECTED
Opiates: NOT DETECTED
Tetrahydrocannabinol: POSITIVE — AB

## 2020-05-09 LAB — SARS CORONAVIRUS 2 (TAT 6-24 HRS): SARS Coronavirus 2: POSITIVE — AB

## 2020-05-09 MED ORDER — ALBUTEROL SULFATE HFA 108 (90 BASE) MCG/ACT IN AERS
1.0000 | INHALATION_SPRAY | Freq: Four times a day (QID) | RESPIRATORY_TRACT | Status: DC | PRN
Start: 2020-05-09 — End: 2020-05-11

## 2020-05-09 MED ORDER — GUAIFENESIN 100 MG/5ML PO SOLN
5.0000 mL | ORAL | Status: DC | PRN
  Administered 2020-05-09: 100 mg via ORAL
  Filled 2020-05-09: qty 15

## 2020-05-09 MED ORDER — ONDANSETRON HCL 4 MG PO TABS
4.0000 mg | ORAL_TABLET | Freq: Three times a day (TID) | ORAL | Status: DC | PRN
  Administered 2020-05-09: 4 mg via ORAL
  Filled 2020-05-09: qty 1

## 2020-05-09 NOTE — Progress Notes (Signed)
Patient ID: Caroline Ingram, female   DOB: 1985/08/28, 34 y.o.   MRN: [id number] ACULTY PRACTICE ANTEPARTUM COMPREHENSIVE PROGRESS NOTE  Caroline Ingram is a 35 y.o. [phone] at [redacted]w[redacted]d  who is admitted for cervical insuffiencey, FGR and elevated dopplers in Tw B in setting of Di/Di twins Fetal presentation is unsure. Length of Stay:  1  Days  Subjective: Pt without complaints this morning. Denies SOB or difficultly with breathing. She does have a H/O asthma and uses a MDI PRN Patient reports good fetal movement.  She reports no uterine contractions, no bleeding and no loss of fluid per vagina.  Vitals:  Blood pressure 132/86, pulse 71, temperature 100.1 F (37.8 C), temperature source Oral, resp. rate 18, height 5\' 4"  (1.626 m), weight 83.7 kg, SpO2 100 %, unknown if currently breastfeeding.   Physical Examination: Lungs clear Heart RRR Abd soft + BS gravid non tender  Fetal Monitoring:  120-140's x 2, + Accels, no Decels or ut ctx  Labs:  Results for orders placed or performed during the hospital encounter of 05/08/20 (from the past 24 hour(s))  Urine rapid drug screen (hosp performed)   Collection Time: 05/08/20 10:32 PM  Result Value Ref Range   Opiates NONE DETECTED NONE DETECTED   Cocaine NONE DETECTED NONE DETECTED   Benzodiazepines NONE DETECTED NONE DETECTED   Amphetamines NONE DETECTED NONE DETECTED   Tetrahydrocannabinol POSITIVE (A) NONE DETECTED   Barbiturates NONE DETECTED NONE DETECTED  SARS CORONAVIRUS 2 (TAT 6-24 HRS) Nasopharyngeal Nasopharyngeal Swab   Collection Time: 05/08/20 10:36 PM   Specimen: Nasopharyngeal Swab  Result Value Ref Range   SARS Coronavirus 2 POSITIVE (A) NEGATIVE  Type and screen [facility]   Collection Time: 05/08/20 10:51 PM  Result Value Ref Range   ABO/RH(D) A POS    Antibody Screen NEG    Sample Expiration      05/11/2020,2359 Performed at [facility], [address], [place], [place]      Imaging Studies:    U/S yesterday   Medications:  Scheduled . amLODipine  10 mg Oral Daily  . betamethasone acetate-betamethasone sodium phosphate  12 mg Intramuscular Q24 Hr x 2  . docusate sodium  100 mg Oral Daily  . prenatal multivitamin  1 tablet Oral Q1200  . sertraline  150 mg Oral QHS   I have reviewed the patient's current medications.  ASSESSMENT: IUP 26 3/7 weeks Di/Di twins Cervical Insuffiencey FGR CHTN Covid 19 + (05/08/20) no Sx Depression/Bipolar Asthma    PLAN: Over all doing well. No evidence of PTL. Will completed second dose of BMZ today. BP stable on Norvasc. Asx from a COVID prospective. Albuterol MDI PRN. Continue with Zoloft. Continue routine antenatal care.   05/10/20 05/09/2020,11:31 AM

## 2020-05-09 NOTE — Progress Notes (Signed)
CSW completed chart review and called in to speak with patient via telephone due to patient's positive COVID screen. Patient answered the phone and CSW explained CSW's role. Patient was receptive to speaking with CSW and confirmed that she was able to speak privately with CSW. Patient asked several questions about insurance and the process to select a new Medicaid plan.  CSW encouraged patient to contact her [facility] worker and request instruction to switch her current Medicaid plan to [facility] [place] or [facility]; patient agreed to call on Monday (1/24).  Patient denied having any other questions or concerns. CSW provided patient with CSW contact information and encouraged patient to contact CSW if a need arises; patient agreed.   CSW is signing off.   Blaine Hamper, MSW, LCSW Clinical Social Work [phone]

## 2020-05-10 DIAGNOSIS — O9852 Other viral diseases complicating childbirth: Secondary | ICD-10-CM

## 2020-05-10 LAB — URINE CULTURE

## 2020-05-10 MED ORDER — PROGESTERONE 200 MG PO CAPS
200.0000 mg | ORAL_CAPSULE | Freq: Every day | ORAL | Status: DC
  Administered 2020-05-10: 200 mg via VAGINAL
  Filled 2020-05-10 (×3): qty 1

## 2020-05-10 MED ORDER — AMLODIPINE BESYLATE 5 MG PO TABS
2.5000 mg | ORAL_TABLET | Freq: Every day | ORAL | Status: DC
  Administered 2020-05-10 – 2020-05-11 (×2): 2.5 mg via ORAL
  Filled 2020-05-10 (×2): qty 1

## 2020-05-10 NOTE — Progress Notes (Signed)
Patient declined assessment and fetal heart monitoring at this time. Patient requesting to continue to sleep and that assessment and fetal heart monitoring can be performed @2200  with scheduled medications. Will attempt assessment @2200 .

## 2020-05-10 NOTE — Progress Notes (Signed)
Patient wanting to be taken off monitor.  She stated she was feeling very nervous and shakey.  Afreed to be placed back on monitor after she calms herself.

## 2020-05-10 NOTE — Progress Notes (Signed)
Patient ID: Caroline Ingram, female   DOB: February 03, 1986, 35 y.o.   MRN: [id number] FACULTY PRACTICE ANTEPARTUM(COMPREHENSIVE) NOTE  Caroline Ingram is a 35 y.o. [id number] at [redacted]w[redacted]d by early ultrasound who is admitted for shortened cervix.   Fetal presentation is variable x 2. Length of Stay:  2  Days  ASSESSMENT: Active Problems:   History of preterm delivery, currently pregnant   Obesity in pregnancy   Supervision of high risk pregnancy, antepartum   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   Pregnancy affected by fetal growth restriction   Cervical insufficiency during pregnancy, antepartum   PLAN: Begin Prometrium S/p BMZ Occasional Ctx, can consider Procardia if persistent Likely home tomorrow.  Subjective: Sad about outcomes that have led her here. She is upset about COVID, the poor growth and the short cervix. She also misses her kids. Patient reports the fetal movement as active. Patient reports uterine contraction  activity as none. Patient reports  vaginal bleeding as none. Patient describes fluid per vagina as None.  Vitals:  Blood pressure (!) 116/54, pulse 73, temperature 98.7 F (37.1 C), temperature source Oral, resp. rate 17, height 5\' 4"  (1.626 m), weight 83.7 kg, SpO2 99 %, unknown if currently breastfeeding. Physical Examination:  General appearance - alert, well appearing, and in no distress Chest - normal effort Abdomen - gravid, non-tender Fundal Height:  size greater than dates Extremities: Homans sign is negative, no sign of DVT  Membranes:intact  Fetal Monitoring: Twin A Baseline: 120 bpm, Variability: Good {> 6 bpm), Accelerations: Reactive and Decelerations: Absent  Twin B Baseline: 130 bpm, Variability: Good {> 6 bpm), Accelerations: Reactive and Decelerations: Absent  Medications:  Scheduled . amLODipine  2.5 mg Oral Daily  . docusate sodium  100 mg Oral Daily  . prenatal multivitamin  1 tablet Oral Q1200  . progesterone  200 mg  Vaginal QHS  . sertraline  150 mg Oral QHS   I have reviewed the patient's current medications.   , MD 05/10/2020,5:35 PM

## 2020-05-11 ENCOUNTER — Other Ambulatory Visit: Payer: Self-pay | Admitting: *Deleted

## 2020-05-11 DIAGNOSIS — Z8759 Personal history of other complications of pregnancy, childbirth and the puerperium: Secondary | ICD-10-CM

## 2020-05-11 DIAGNOSIS — O30042 Twin pregnancy, dichorionic/diamniotic, second trimester: Secondary | ICD-10-CM

## 2020-05-11 DIAGNOSIS — E669 Obesity, unspecified: Secondary | ICD-10-CM

## 2020-05-11 DIAGNOSIS — O99212 Obesity complicating pregnancy, second trimester: Secondary | ICD-10-CM

## 2020-05-11 DIAGNOSIS — O98512 Other viral diseases complicating pregnancy, second trimester: Secondary | ICD-10-CM | POA: Diagnosis present

## 2020-05-11 DIAGNOSIS — U071 COVID-19: Secondary | ICD-10-CM | POA: Diagnosis present

## 2020-05-11 DIAGNOSIS — O0992 Supervision of high risk pregnancy, unspecified, second trimester: Secondary | ICD-10-CM

## 2020-05-11 DIAGNOSIS — O4702 False labor before 37 completed weeks of gestation, second trimester: Principal | ICD-10-CM

## 2020-05-11 MED ORDER — GUAIFENESIN 100 MG/5ML PO SOLN: 5.0000 mL | ORAL | 1 refills | Status: DC | PRN

## 2020-05-11 MED ORDER — DOCUSATE SODIUM 100 MG PO CAPS: 100.0000 mg | ORAL_CAPSULE | Freq: Two times a day (BID) | ORAL | 0 refills | Status: DC | PRN

## 2020-05-11 MED ORDER — PROGESTERONE 200 MG PO CAPS: 200.0000 mg | ORAL_CAPSULE | Freq: Every day | ORAL | 1 refills | Status: DC

## 2020-05-11 NOTE — Progress Notes (Signed)
Pt discharged after discharge instructions given. All questions answered. Pt discharged via wheelchair in stable condition.

## 2020-05-11 NOTE — Discharge Instructions (Signed)
Preventing Preterm Birth Preterm birth is when a baby is delivered between 20 weeks and 37 weeks of pregnancy. A full-term pregnancy lasts for at least 37 weeks. Preterm birth can be dangerous for your baby because the last few weeks of pregnancy are an important time for your baby to grow and reach a normal birth weight. How can preterm birth affect my baby? Complications of preterm birth may include:  Breathing problems.  Brain damage that affects movement and coordination (cerebral palsy).  Trouble with feeding.  Problems with vision or hearing.  Infections or inflammation of the digestive tract (colitis).  Developmental delays and learning disabilities.  Low birth weight or very low birth weight.  Higher risk for diabetes, heart disease, and high blood pressure later in life. What can increase my risk of having a preterm birth? The exact cause of preterm birth is unknown. The following factors make you more likely to have a preterm birth:  Being diagnosed with placenta previa. This is a condition in which the placenta covers the lowest part of your uterus (cervix), which opens into the vagina.  Certain conditions of your current and past pregnancies, such as: ? Having had a preterm birth before. ? Being pregnant with multiples. ? Waiting less than 6 months between giving birth and becoming pregnant again. ? Certain abnormalities in your unborn baby. ? Vaginal bleeding during pregnancy. ? Becoming pregnant through in vitro fertilization (IVF).  Being overweight or underweight.  Medical history of: ? STIs (sexually transmitted infections) or other infections of the urinary tract and the vagina. ? Long-term (chronic) illnesses, such as blood clotting problems, diabetes, or high blood pressure. ? Short cervix.  Lifestyle and environmental factors, such as: ? Using tobacco products or drugs. ? Drinking alcohol. ? Having stress and no social support. ? Violence in the home  (domestic violence). ? Being exposed to certain chemicals or pollutants in the environment. What actions can I take to prevent preterm birth? Medical care The most important thing you can do to lower your risk for preterm birth is to get routine medical care during pregnancy (prenatal care). Keep all follow-up visits as told by your health care provider. This is important.  If you have a high risk of preterm birth:  You may be referred to a health care provider who specializes in managing high-risk pregnancies (perinatologist).  You may be given medicine to help prevent preterm birth. Lifestyle Certain lifestyle changes can also lower your risk of preterm birth:  Wait at least 6 months after a pregnancy to become pregnant again.  Get to a healthy weight before getting pregnant. If you are overweight, work with your health care provider to safely lose weight.  Do not use any products that contain nicotine or tobacco, such as cigarettes, e-cigarettes, and chewing tobacco. If you need help quitting, ask your health care provider.  Do not drink alcohol.  Do not use drugs.  Eat a healthy diet.  Manage other medical problems, such as diabetes or high blood pressure.   Where to find support For more support, consider:  Talking with your health care provider.  Talking with a therapist or substance abuse counselor, if you need help quitting.  Working with a dietitian or a personal trainer to maintain a healthy weight.  Joining a support group. Where to find more information Learn more about preventing preterm birth from:  Centers for Disease Control and Prevention: cdc.gov  March of Dimes: marchofdimes.org  American Pregnancy Association: americanpregnancy.org Contact a   health care provider if: You have any of the following signs or symptoms of preterm labor before 37 weeks:  A change or increase in vaginal discharge.  Fluid leaking from your vagina.  Pressure or cramps in  your lower abdomen.  A backache that does not go away or gets worse.  Regular tightening (contractions) in your lower abdomen. Get help right away if:  You are having regular painful contractions every 5 minutes or less.  Your water breaks. Summary  Preterm birth means having your baby during weeks 20-37 of pregnancy.  Preterm birth may put your baby at risk for physical and mental problems.  The exact cause of preterm birth is unknown. However, being diagnosed with placenta previa or having vaginal bleeding or an STI (sexually transmitted infection) increases your risk for preterm birth.  Getting good prenatal care can help prevent preterm birth. Keep all follow-up visits as told by your health care provider. This is important.  Contact a health care provider if you have signs or symptoms of preterm labor. This information is not intended to replace advice given to you by your health care provider. Make sure you discuss any questions you have with your health care provider. Document Revised: 03/11/2019 Document Reviewed: 03/11/2019 Elsevier Patient Education  2021 [employer]  

## 2020-05-11 NOTE — Discharge Summary (Addendum)
Antenatal Physician Discharge Summary  Patient ID: Caroline Ingram MRN: [id number] DOB/AGE: 34-Oct-1987 34 y.o.  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 [id number] with IUP at [redacted]w[redacted]d admitted on 05/08/2020 for FGR and cervical insufficiency in the setting of di-di twin pregnancy.  She was noted to have a closed cervical os by MFM speculum examination prior to admission. On admission, denied contractions, leaking of fluid or bleeding. Admission labs showed +COVID, she was asymptomatic and placed on proper precautions. She was started on betamethasone x 2 doses.  She was observed, fetal heart rate monitoring remained reassuring, and she had no signs/symptoms of progressing preterm labor or other maternal-fetal concerns. Prometrium prescribed. She was deemed stable for discharge to home with outpatient follow up.  She was told to continue COVID quarantine at home.  Discharge Exam: Temp:  [98.1 F (36.7 C)-98.7 F (37.1 C)] 98.3 F (36.8 C) (01/24 0810) Pulse Rate:  [60-73] 60 (01/24 0810) Resp:  [17-19] 18 (01/24 0810) BP: (106-140)/(48-70) 111/63 (01/24 0810) SpO2:  [97 %-100 %] 99 % (01/24 0810) Physical Examination: CONSTITUTIONAL: Well-developed, well-nourished female in no acute distress.  HENT:  Normocephalic, atraumatic, External right and left ear normal. Oropharynx is clear and moist EYES:  Conjunctivae and EOM are normal. Pupils are equal, round, and reactive to light. No scleral icterus.  NECK: Normal range of motion, supple, no masses SKIN: Skin is warm and dry. No rash noted. Not diaphoretic. No erythema. No pallor. NEUROLGIC: Alert and oriented to person, place, and time. Normal reflexes, muscle tone coordination. No cranial nerve deficit noted. PSYCHIATRIC: Normal mood and affect. Normal behavior. Normal judgment and thought content. CARDIOVASCULAR: Normal heart rate noted, regular rhythm RESPIRATORY: Effort and breath sounds normal, no problems with respiration noted MUSCULOSKELETAL: Normal range of motion. No edema and no tenderness. 2+ distal pulses. ABDOMEN: Soft, nontender, nondistended, gravid. CERVIX:  Deferred  Fetal monitoring: FHR: A 120 bpm and B 130 bpm, Variability: moderate, Accelerations: 10 x10 Present x 2, Decelerations: Absent x 2 Uterine activity: No contractions  Significant Diagnostic Studies:  Results for orders placed or performed during the hospital encounter of 05/08/20 (from the past 168 hour(s))  Urine culture   Collection Time: 05/08/20 10:32 PM   Specimen: Urine, Clean Catch  Result Value Ref Range   Specimen Description URINE, CLEAN CATCH    Special Requests      NONE Performed at [facility], [address] [place], [place] [address]    Culture MULTIPLE SPECIES PRESENT, SUGGEST RECOLLECTION (A)    Report Status 05/10/2020 FINAL   Urine rapid drug screen (hosp performed)   Collection Time: 05/08/20 10:32 PM  Result Value Ref Range   Opiates NONE DETECTED NONE DETECTED   Cocaine NONE DETECTED NONE DETECTED   Benzodiazepines NONE DETECTED NONE DETECTED   Amphetamines NONE DETECTED NONE DETECTED   Tetrahydrocannabinol POSITIVE (A) NONE DETECTED   Barbiturates NONE DETECTED NONE DETECTED  SARS CORONAVIRUS 2 (TAT 6-24 HRS)  Nasopharyngeal Nasopharyngeal Swab   Collection Time: 05/08/20 10:36 PM   Specimen: Nasopharyngeal Swab   Result Value Ref Range   SARS Coronavirus 2 POSITIVE (A) NEGATIVE  Type and screen [facility]   Collection Time: 05/08/20 10:51 PM  Result Value Ref Range   ABO/RH(D) A POS    Antibody Screen NEG    Sample Expiration      05/11/2020,2359 Performed at [facility], [address], [place], [place] [address]    [place] MFM OB Transvaginal  Result Date: 05/08/2020 ----------------------------------------------------------------------  OBSTETRICS REPORT                       (Signed Final 05/08/2020 06:04 pm) ---------------------------------------------------------------------- Patient Info  ID #:       [id number]                          D.O.B.:  [birth year] (35 yrs)  Name:       Caroline Ingram               Visit Date: 05/08/2020 12:54 pm ---------------------------------------------------------------------- Performed By  Attending:        Lin Landsman      Ref. Address:     [address]                    [place]                                                             [place], [place]                                                             [address]  Performed By:     Eden Lathe BS      Location:         [facility]                    RDMS RVT                                 [facility]  Referred By:      [facility] ---------------------------------------------------------------------- Orders  #  Description                           Code        Ordered By  1  [place] MFM OB DETAIL +14 WK               [id number]    MATTHEW ECKSTAT  2  [place] MFM OB DETAIL ADDL GEST            76811.02    MATTHEW ECKSTAT     +14 WK  3  [place] MFM UA CORD DOPPLER                76820.02    MATTHEW ECKSTAT  4  [place] MFM UA ADDL GEST                   76820.01    MATTHEW ECKSTAT  5  [place] MFM OB TRANSVAGINAL                [id number]      MATTHEW ECKSTAT ----------------------------------------------------------------------  #  Order #                     Accession #                Episode #  1  [id number]                   [id number]                 [id number]  2  [id number]                   [id number]                 [id number]  3  [id number]                   [id number]                 [id number]  4  [id number]                   [id number]                 [id number]  5  [id number]                   [id number]                 [id number] ---------------------------------------------------------------------- Indications  Antenatal screening for malformations          Z36.3  Encounter for cervical length                  Z36.86  Twin pregnancy, di/di, second trimester        O30.042  Short interval between pregancies, 2nd         O09.892  trimester  Poor obstetric history: Previous preterm       O09.219  delivery, antepartum x 2 (36 weeks)  Poor obstetric history: Previous fetal growth  O09.299  restriction (FGR)  Cervical insufficiency, 2nd                    O34.32  [redacted] weeks gestation of pregnancy                Z3A.26 ---------------------------------------------------------------------- Fetal Evaluation (Fetus A)  Num Of Fetuses:  2  Fetal Heart Rate(bpm):  136  Cardiac Activity:       Observed  Fetal Lie:              Lower Fetus  Presentation:           Variable  Placenta:               Anterior  P. Cord Insertion:      Visualized  Membrane Desc:      Dividing Membrane seen - Dichorionic.  Amniotic Fluid  AFI FV:      Within normal limits                              Largest Pocket(cm)                              4.4 ---------------------------------------------------------------------- Biometry (Fetus A)  BPD:      64.4  mm     G. Age:  26w 0d         31  %    CI:        74.68   %    70 - 86                                                          FL/HC:      18.3   %    18.6 - 20.4  HC:      236.5  mm     G. Age:  25w 5d         11  %    HC/AC:       1.11        1.04 - 1.22  AC:      213.7  mm     G. Age:  25w 6d         29  %    FL/BPD:     67.1   %    71 - 87  FL:       43.2  mm     G. Age:  24w 1d        1.6  %    FL/AC:      20.2   %    20 - 24  HUM:      41.2  mm     G. Age:  25w 0d         12  %  CER:      30.2  mm     G. Age:  26w 2d         53  %  LV:        4.8  mm  CM:        7.2  mm  Est. FW:     788  gm    1 lb 12 oz       9  %     FW Discordancy      0 \ 3 % ---------------------------------------------------------------------- OB History  Gravidity:    4         Term:   1        Prem:   2  SAB:   0  TOP:          0       Ectopic:  0        Living: 3 ---------------------------------------------------------------------- Gestational Age (Fetus A)  U/S Today:     25w 3d                                        EDD:   08/18/20  Best:          26w 2d     Det. ByMarcella Dubs         EDD:   08/12/20                                      (12/26/19) ---------------------------------------------------------------------- Anatomy (Fetus A)  Cranium:               Appears normal         LVOT:                   Appears normal  Cavum:                 Appears normal         Aortic Arch:            Not well visualized  Ventricles:            Appears normal         Ductal Arch:            Not well visualized  Choroid Plexus:        Appears normal         Diaphragm:              Appears normal  Cerebellum:            Appears normal         Stomach:                Appears normal, left                                                                        sided  Posterior Fossa:       Appears normal         Abdomen:                Appears normal  Nuchal Fold:           Not applicable (>20    Abdominal Wall:         Appears nml (cord                         wks GA)                                        insert, abd wall)  Face:  Not well visualized    Cord Vessels:           Appears normal (3                                                                         vessel cord)  Lips:                  Appears normal         Kidneys:                Appear normal  Palate:                Not well visualized    Bladder:                Appears normal  Thoracic:              Appears normal         Spine:                  Appears normal  Heart:                 Appears normal         Upper Extremities:      Visualized                         (4CH, axis, and                         situs)  RVOT:                  Not well visualized    Lower Extremities:      Appears normal  Other:  Fetus appears to be a female.Heels visualized. VC, 3VV ---------------------------------------------------------------------- Doppler - Fetal Vessels (Fetus A)  Umbilical Artery   S/D     %tile      RI    %tile                             ADFV    RDFV   4.38       92    0.77       88                                No      No ---------------------------------------------------------------------- Fetal Evaluation (Fetus B)  Num Of Fetuses:         2  Fetal Heart Rate(bpm):  129  Cardiac Activity:       Observed  Fetal Lie:              Upper Fetus  Presentation:           Transverse, head to maternal left  Placenta:               Right lateral  P. Cord Insertion:      Not well visualized  Membrane Desc:      Dividing Membrane seen - Dichorionic.  Amniotic Fluid  AFI FV:  Within normal limits                              Largest Pocket(cm)                              4.4 ---------------------------------------------------------------------- Biometry (Fetus B)  BPD:      64.3  mm     G. Age:  26w 0d         30  %    CI:        74.81   %    70 - 86                                                          FL/HC:      19.2   %    18.6 - 20.4  HC:      235.9  mm     G. Age:  25w 4d         10  %    HC/AC:      1.16        1.04 - 1.22  AC:      203.6  mm     G. Age:  25w 0d          9  %    FL/BPD:     70.3   %    71 - 87  FL:       45.2  mm     G. Age:  25w 0d          7  %    FL/AC:       22.2   %    20 - 24  HUM:      39.7  mm     G. Age:  24w 1d        < 5  %  CER:      29.7  mm     G. Age:  26w 0d         45  %  LV:        4.6  mm  CM:          7  mm  Est. FW:     767  gm    1 lb 11 oz       6  %     FW Discordancy         3  % ---------------------------------------------------------------------- Gestational Age (Fetus B)  U/S Today:     25w 3d                                        EDD:   08/18/20  Best:          26w 2d     Det. ByMarcella Dubs         EDD:   08/12/20                                      (  12/26/19) ---------------------------------------------------------------------- Anatomy (Fetus B)  Cranium:               Appears normal         LVOT:                   Appears normal  Cavum:                 Appears normal         Aortic Arch:            Appears normal  Ventricles:            Appears normal         Ductal Arch:            Appears normal  Choroid Plexus:        Appears normal         Diaphragm:              Not well visualized  Cerebellum:            Appears normal         Stomach:                Appears normal, left                                                                        sided  Posterior Fossa:       Appears normal         Abdomen:                Appears normal  Nuchal Fold:           Not applicable (>20    Abdominal Wall:         Not well visualized                         wks GA)  Face:                  Appears normal         Cord Vessels:           Appears normal (3                         (orbits and profile)                           vessel cord)  Lips:                  Appears normal         Kidneys:                Appear normal  Palate:                Not well visualized    Bladder:                Appears normal  Thoracic:              Appears normal         Spine:  Appears normal  Heart:                 Appears normal; EIF    Upper Extremities:      Visualized  RVOT:                  Appears normal         Lower Extremities:       Appears normal  Other:  Fetus appears to be a female. VC, 3VV visualized. Left foot/heel          visualized. ---------------------------------------------------------------------- Doppler - Fetal Vessels (Fetus B)  Umbilical Artery   S/D     %tile      RI    %tile                             ADFV    RDFV   6.37   > 97.5    0.84   > 97.5                                No      No ---------------------------------------------------------------------- Cervix Uterus Adnexa  Cervix  No measurable cervix. See comments.  Uterus  No abnormality visualized.  Right Ovary  Within normal limits.  Left Ovary  Within normal limits.  Cul De Sac  No free fluid seen.  Adnexa  No abnormality visualized. ---------------------------------------------------------------------- Impression  Twin intrauterine pregnancy with features suggestive of  Diamniotic Dichorionic pregnancy.  Normal anatomy with good amniotic fluid and fetal movement  was observed in Twin A and B.  Suboptimal views of fetal anatomy was obtained as  documented above.  I reviewed the normal nature of today's ultrasound. Ms.  Freeze conveyed that she is taking low dose aspirin for  preeclampsia prevention.  Today we observed FGR in both Twin A and B. In particular  Twin B had elevated UA Dopplers with no evidence of AEDF  or REDF. Twin A had normal UA Dopplers.  Secondly, a transvaginal exam was performed to assess  suspected a cervical funneling. We first performed a sterile  speculum exam that revealed a closed external os. However,  on TV there was funneling to the external os.  Ms. Farler  denied vaginal bleeding or pelvic pain. I discussed the  recommendation to be seen to MAU for monitoring, steroids  and possible tocolysis. In addition, she needs an NST as she  could not stay for monitoring given today's finding of IUGR.  She plan to return to MAU by  6:30 pm as she has children at  home to care for until her significant other arrives home.  If she is discharged  we have scheduled her for weekly testing  with NST and repeat growth in 3 weeks.  We reviewed the sonographic findings and limitations of  ultrasound. The potential risks associated with a twin  gestation were discussed.  This discussion included a review  of the increased risk of miscarriages, anomalies, preterm  labor, and/or delivery, malpresentation, delivery via cesarean  section, gestational diabetes, and/or preeclampsia.  With  regards to fetal risks, there is an increased risk for fetal  growth restriction of one or both twins, preterm labor, and  associated morbidity, and intrauterine fetal demise.  As noted  on today's exam.  We recommend growth scans every 4 weeks starting at 24  weeks with the initation  of weekly antenatal testing in the  form of twice weekly NST or weekly BPP should abnormal  fetal growth or intertwin discordance of greater than 20-25%  is noted.  Following counseling, all questions were addressed. ---------------------------------------------------------------------- Recommendations  To MAU by 6:30 pm  NST, BMZ and possible tocolysis.  Weekly UA Dopplers.  NICU consultation.  I discussed with Dr. Adrian Blackwater who is aware of this plan made  with Ms. Janey Greaser ----------------------------------------------------------------------               Lin Landsman, MD Electronically Signed Final Report   05/08/2020 06:04 pm ----------------------------------------------------------------------  [place] MFM OB DETAIL +14 WK  Result Date: 05/08/2020 ----------------------------------------------------------------------  OBSTETRICS REPORT                       (Signed Final 05/08/2020 06:04 pm) ---------------------------------------------------------------------- Patient Info  ID #:       [id number]                          D.O.B.:  1985/07/27 (34 yrs)  Name:       Caroline Ingram               Visit Date: 05/08/2020 12:54 pm ----------------------------------------------------------------------  Performed By  Attending:        Lin Landsman      Ref. Address:     [address]                    [place]                                                             [place], [place]                                                             [address]  Performed By:     Eden Lathe BS      Location:         [facility]                    RDMS RVT                                 [facility]  Referred By:  [facility] ---------------------------------------------------------------------- Orders  #  Description                           Code        Ordered By  1  [place] MFM OB DETAIL +14 WK               [id number]    MATTHEW ECKSTAT  2  [place] MFM OB DETAIL ADDL GEST            76811.02    MATTHEW ECKSTAT     +14 WK  3  [place] MFM UA CORD DOPPLER                76820.02    MATTHEW ECKSTAT  4  [place] MFM UA ADDL GEST                   76820.01    MATTHEW ECKSTAT  5  [place] MFM OB TRANSVAGINAL                [id number]     MATTHEW ECKSTAT ----------------------------------------------------------------------  #  Order #                     Accession #                Episode #  1  [id number]                   [id number]                 [id number]  2  [id number]                   [id number]                 [id number]  3  [id number]                   [id number]                 [id number]  4  [id number]                   [id number]                 [id number]  5  [id number]                   [id number]                 [id number] ---------------------------------------------------------------------- Indications  Antenatal screening for malformations          Z36.3  Encounter for cervical length                  Z36.86  Twin pregnancy, di/di, second trimester        O30.042  Short interval between pregancies, 2nd         O09.892  trimester  Poor obstetric history: Previous  preterm       O09.219  delivery, antepartum x 2 (36 weeks)  Poor obstetric history: Previous fetal growth  O09.299  restriction (FGR)  Cervical insufficiency, 2nd                    O34.32  [redacted] weeks gestation of pregnancy  Z3A.26 ---------------------------------------------------------------------- Fetal Evaluation (Fetus A)  Num Of Fetuses:         2  Fetal Heart Rate(bpm):  136  Cardiac Activity:       Observed  Fetal Lie:              Lower Fetus  Presentation:           Variable  Placenta:               Anterior  P. Cord Insertion:      Visualized  Membrane Desc:      Dividing Membrane seen - Dichorionic.  Amniotic Fluid  AFI FV:      Within normal limits                              Largest Pocket(cm)                              4.4 ---------------------------------------------------------------------- Biometry (Fetus A)  BPD:      64.4  mm     G. Age:  26w 0d         31  %    CI:        74.68   %    70 - 86                                                          FL/HC:      18.3   %    18.6 - 20.4  HC:      236.5  mm     G. Age:  25w 5d         11  %    HC/AC:      1.11        1.04 - 1.22  AC:      213.7  mm     G. Age:  25w 6d         29  %    FL/BPD:     67.1   %    71 - 87  FL:       43.2  mm     G. Age:  24w 1d        1.6  %    FL/AC:      20.2   %    20 - 24  HUM:      41.2  mm     G. Age:  25w 0d         12  %  CER:      30.2  mm     G. Age:  26w 2d         53  %  LV:        4.8  mm  CM:        7.2  mm  Est. FW:     788  gm    1 lb 12 oz       9  %     FW Discordancy      0 \ 3 % ---------------------------------------------------------------------- OB History  Gravidity:    4         Term:  1        Prem:   2        SAB:   0  TOP:          0       Ectopic:  0        Living: 3 ---------------------------------------------------------------------- Gestational Age (Fetus A)  U/S Today:     25w 3d                                        EDD:   08/18/20  Best:          26w 2d     Det. ByMarcella Dubs:   Early Ultrasound         EDD:   08/12/20                                      (12/26/19) ---------------------------------------------------------------------- Anatomy (Fetus A)  Cranium:               Appears normal         LVOT:                   Appears normal  Cavum:                 Appears normal         Aortic Arch:            Not well visualized  Ventricles:            Appears normal         Ductal Arch:            Not well visualized  Choroid Plexus:        Appears normal         Diaphragm:              Appears normal  Cerebellum:            Appears normal         Stomach:                Appears normal, left                                                                        sided  Posterior Fossa:       Appears normal         Abdomen:                Appears normal  Nuchal Fold:           Not applicable (>20    Abdominal Wall:         Appears nml (cord                         wks GA)  insert, abd wall)  Face:                  Not well visualized    Cord Vessels:           Appears normal (3                                                                        vessel cord)  Lips:                  Appears normal         Kidneys:                Appear normal  Palate:                Not well visualized    Bladder:                Appears normal  Thoracic:              Appears normal         Spine:                  Appears normal  Heart:                 Appears normal         Upper Extremities:      Visualized                         (4CH, axis, and                         situs)  RVOT:                  Not well visualized    Lower Extremities:      Appears normal  Other:  Fetus appears to be a female.Heels visualized. VC, 3VV ---------------------------------------------------------------------- Doppler - Fetal Vessels (Fetus A)  Umbilical Artery   S/D     %tile      RI    %tile                             ADFV    RDFV   4.38       92    0.77       88                                 No      No ---------------------------------------------------------------------- Fetal Evaluation (Fetus B)  Num Of Fetuses:         2  Fetal Heart Rate(bpm):  129  Cardiac Activity:       Observed  Fetal Lie:              Upper Fetus  Presentation:           Transverse, head to maternal left  Placenta:               Right lateral  P. Cord Insertion:  Not well visualized  Membrane Desc:      Dividing Membrane seen - Dichorionic.  Amniotic Fluid  AFI FV:      Within normal limits                              Largest Pocket(cm)                              4.4 ---------------------------------------------------------------------- Biometry (Fetus B)  BPD:      64.3  mm     G. Age:  26w 0d         30  %    CI:        74.81   %    70 - 86                                                          FL/HC:      19.2   %    18.6 - 20.4  HC:      235.9  mm     G. Age:  25w 4d         10  %    HC/AC:      1.16        1.04 - 1.22  AC:      203.6  mm     G. Age:  25w 0d          9  %    FL/BPD:     70.3   %    71 - 87  FL:       45.2  mm     G. Age:  25w 0d          7  %    FL/AC:      22.2   %    20 - 24  HUM:      39.7  mm     G. Age:  24w 1d        < 5  %  CER:      29.7  mm     G. Age:  26w 0d         45  %  LV:        4.6  mm  CM:          7  mm  Est. FW:     767  gm    1 lb 11 oz       6  %     FW Discordancy         3  % ---------------------------------------------------------------------- Gestational Age (Fetus B)  U/S Today:     25w 3d                                        EDD:   08/18/20  Best:          26w 2d     Det. ByMarcella Dubs         EDD:   08/12/20                                      (  12/26/19) ---------------------------------------------------------------------- Anatomy (Fetus B)  Cranium:               Appears normal         LVOT:                   Appears normal  Cavum:                 Appears normal         Aortic Arch:            Appears normal  Ventricles:            Appears normal          Ductal Arch:            Appears normal  Choroid Plexus:        Appears normal         Diaphragm:              Not well visualized  Cerebellum:            Appears normal         Stomach:                Appears normal, left                                                                        sided  Posterior Fossa:       Appears normal         Abdomen:                Appears normal  Nuchal Fold:           Not applicable (>20    Abdominal Wall:         Not well visualized                         wks GA)  Face:                  Appears normal         Cord Vessels:           Appears normal (3                         (orbits and profile)                           vessel cord)  Lips:                  Appears normal         Kidneys:                Appear normal  Palate:                Not well visualized    Bladder:                Appears normal  Thoracic:              Appears normal         Spine:  Appears normal  Heart:                 Appears normal; EIF    Upper Extremities:      Visualized  RVOT:                  Appears normal         Lower Extremities:      Appears normal  Other:  Fetus appears to be a female. VC, 3VV visualized. Left foot/heel          visualized. ---------------------------------------------------------------------- Doppler - Fetal Vessels (Fetus B)  Umbilical Artery   S/D     %tile      RI    %tile                             ADFV    RDFV   6.37   > 97.5    0.84   > 97.5                                No      No ---------------------------------------------------------------------- Cervix Uterus Adnexa  Cervix  No measurable cervix. See comments.  Uterus  No abnormality visualized.  Right Ovary  Within normal limits.  Left Ovary  Within normal limits.  Cul De Sac  No free fluid seen.  Adnexa  No abnormality visualized. ---------------------------------------------------------------------- Impression  Twin intrauterine pregnancy with features suggestive of  Diamniotic  Dichorionic pregnancy.  Normal anatomy with good amniotic fluid and fetal movement  was observed in Twin A and B.  Suboptimal views of fetal anatomy was obtained as  documented above.  I reviewed the normal nature of today's ultrasound. Ms.  Roznowski conveyed that she is taking low dose aspirin for  preeclampsia prevention.  Today we observed FGR in both Twin A and B. In particular  Twin B had elevated UA Dopplers with no evidence of AEDF  or REDF. Twin A had normal UA Dopplers.  Secondly, a transvaginal exam was performed to assess  suspected a cervical funneling. We first performed a sterile  speculum exam that revealed a closed external os. However,  on TV there was funneling to the external os.  Ms. Hauter  denied vaginal bleeding or pelvic pain. I discussed the  recommendation to be seen to MAU for monitoring, steroids  and possible tocolysis. In addition, she needs an NST as she  could not stay for monitoring given today's finding of IUGR.  She plan to return to MAU by  6:30 pm as she has children at  home to care for until her significant other arrives home.  If she is discharged we have scheduled her for weekly testing  with NST and repeat growth in 3 weeks.  We reviewed the sonographic findings and limitations of  ultrasound. The potential risks associated with a twin  gestation were discussed.  This discussion included a review  of the increased risk of miscarriages, anomalies, preterm  labor, and/or delivery, malpresentation, delivery via cesarean  section, gestational diabetes, and/or preeclampsia.  With  regards to fetal risks, there is an increased risk for fetal  growth restriction of one or both twins, preterm labor, and  associated morbidity, and intrauterine fetal demise.  As noted  on today's exam.  We recommend growth scans every 4 weeks starting at 24  weeks with the initation of  weekly antenatal testing in the  form of twice weekly NST or weekly BPP should abnormal  fetal growth or intertwin  discordance of greater than 20-25%  is noted.  Following counseling, all questions were addressed. ---------------------------------------------------------------------- Recommendations  To MAU by 6:30 pm  NST, BMZ and possible tocolysis.  Weekly UA Dopplers.  NICU consultation.  I discussed with Dr. Adrian Blackwater who is aware of this plan made  with Ms. Janey Greaser ----------------------------------------------------------------------               Lin Landsman, MD Electronically Signed Final Report   05/08/2020 06:04 pm ----------------------------------------------------------------------  [place] MFM OB DETAIL ADDL GEST +14 WK  Result Date: 05/08/2020 ----------------------------------------------------------------------  OBSTETRICS REPORT                       (Signed Final 05/08/2020 06:04 pm) ---------------------------------------------------------------------- Patient Info  ID #:       [id number]                          D.O.B.:  October 17, 1985 (34 yrs)  Name:       Caroline [facility]               Visit Date: 05/08/2020 12:54 pm ---------------------------------------------------------------------- Performed By  Attending:        Lin Landsman      Ref. Address:     [address]                    [place]                                                             [place], [place]                                                             [address]  Performed By:     Eden Lathe BS      Location:         [facility]                    RD[facility] RVT                                 [facility]  Referred By:  [facility] ---------------------------------------------------------------------- Orders  #  Description                           Code        Ordered By  1  [place] MFM OB DETAIL +14 WK               [id number]    MATTHEW ECKSTAT  2  [place] MFM OB  DETAIL ADDL GEST            76811.02    MATTHEW ECKSTAT     +14 WK  3  [place] MFM UA CORD DOPPLER                76820.02    MATTHEW ECKSTAT  4  [place] MFM UA ADDL GEST                   76820.01    MATTHEW ECKSTAT  5  [place] MFM OB TRANSVAGINAL                [id number]     MATTHEW ECKSTAT ----------------------------------------------------------------------  #  Order #                     Accession #                Episode #  1  [id number]                   [id number]                 [id number]  2  [id number]                   [id number]                 [id number]  3  [id number]                   [id number]                 [id number]  4  [id number]                   [id number]                 [id number]  5  [id number]                   [id number]                 [id number] ---------------------------------------------------------------------- Indications  Antenatal screening for malformations          Z36.3  Encounter for cervical length                  Z36.86  Twin pregnancy, di/di, second trimester        O30.042  Short interval between pregancies, 2nd         O09.892  trimester  Poor obstetric history: Previous preterm       O09.219  delivery, antepartum x 2 (36 weeks)  Poor obstetric history: Previous fetal growth  O09.299  restriction (FGR)  Cervical insufficiency, 2nd                    O34.32  [redacted] weeks gestation of pregnancy  Z3A.26 ---------------------------------------------------------------------- Fetal Evaluation (Fetus A)  Num Of Fetuses:         2  Fetal Heart Rate(bpm):  136  Cardiac Activity:       Observed  Fetal Lie:              Lower Fetus  Presentation:           Variable  Placenta:               Anterior  P. Cord Insertion:      Visualized  Membrane Desc:      Dividing Membrane seen - Dichorionic.  Amniotic Fluid  AFI FV:      Within normal limits                              Largest Pocket(cm)                              4.4 ----------------------------------------------------------------------  Biometry (Fetus A)  BPD:      64.4  mm     G. Age:  26w 0d         31  %    CI:        74.68   %    70 - 86                                                          FL/HC:      18.3   %    18.6 - 20.4  HC:      236.5  mm     G. Age:  25w 5d         11  %    HC/AC:      1.11        1.04 - 1.22  AC:      213.7  mm     G. Age:  25w 6d         29  %    FL/BPD:     67.1   %    71 - 87  FL:       43.2  mm     G. Age:  24w 1d        1.6  %    FL/AC:      20.2   %    20 - 24  HUM:      41.2  mm     G. Age:  25w 0d         12  %  CER:      30.2  mm     G. Age:  26w 2d         53  %  LV:        4.8  mm  CM:        7.2  mm  Est. FW:     788  gm    1 lb 12 oz       9  %     FW Discordancy      0 \ 3 % ---------------------------------------------------------------------- OB History  Gravidity:    4         Term:  1        Prem:   2        SAB:   0  TOP:          0       Ectopic:  0        Living: 3 ---------------------------------------------------------------------- Gestational Age (Fetus A)  U/S Today:     25w 3d                                        EDD:   08/18/20  Best:          26w 2d     Det. ByMarcella Dubs         EDD:   08/12/20                                      (12/26/19) ---------------------------------------------------------------------- Anatomy (Fetus A)  Cranium:               Appears normal         LVOT:                   Appears normal  Cavum:                 Appears normal         Aortic Arch:            Not well visualized  Ventricles:            Appears normal         Ductal Arch:            Not well visualized  Choroid Plexus:        Appears normal         Diaphragm:              Appears normal  Cerebellum:            Appears normal         Stomach:                Appears normal, left                                                                        sided  Posterior Fossa:       Appears normal         Abdomen:                Appears normal  Nuchal Fold:           Not applicable (>20     Abdominal Wall:         Appears nml (cord                         wks GA)  insert, abd wall)  Face:                  Not well visualized    Cord Vessels:           Appears normal (3                                                                        vessel cord)  Lips:                  Appears normal         Kidneys:                Appear normal  Palate:                Not well visualized    Bladder:                Appears normal  Thoracic:              Appears normal         Spine:                  Appears normal  Heart:                 Appears normal         Upper Extremities:      Visualized                         (4CH, axis, and                         situs)  RVOT:                  Not well visualized    Lower Extremities:      Appears normal  Other:  Fetus appears to be a female.Heels visualized. VC, 3VV ---------------------------------------------------------------------- Doppler - Fetal Vessels (Fetus A)  Umbilical Artery   S/D     %tile      RI    %tile                             ADFV    RDFV   4.38       92    0.77       88                                No      No ---------------------------------------------------------------------- Fetal Evaluation (Fetus B)  Num Of Fetuses:         2  Fetal Heart Rate(bpm):  129  Cardiac Activity:       Observed  Fetal Lie:              Upper Fetus  Presentation:           Transverse, head to maternal left  Placenta:               Right lateral  P. Cord Insertion:      Not  well visualized  Membrane Desc:      Dividing Membrane seen - Dichorionic.  Amniotic Fluid  AFI FV:      Within normal limits                              Largest Pocket(cm)                              4.4 ---------------------------------------------------------------------- Biometry (Fetus B)  BPD:      64.3  mm     G. Age:  26w 0d         30  %    CI:        74.81   %    70 - 86                                                          FL/HC:      19.2   %     18.6 - 20.4  HC:      235.9  mm     G. Age:  25w 4d         10  %    HC/AC:      1.16        1.04 - 1.22  AC:      203.6  mm     G. Age:  25w 0d          9  %    FL/BPD:     70.3   %    71 - 87  FL:       45.2  mm     G. Age:  25w 0d          7  %    FL/AC:      22.2   %    20 - 24  HUM:      39.7  mm     G. Age:  24w 1d        < 5  %  CER:      29.7  mm     G. Age:  26w 0d         45  %  LV:        4.6  mm  CM:          7  mm  Est. FW:     767  gm    1 lb 11 oz       6  %     FW Discordancy         3  % ---------------------------------------------------------------------- Gestational Age (Fetus B)  U/S Today:     25w 3d                                        EDD:   08/18/20  Best:          26w 2d     Det. ByMarcella Dubs         EDD:   08/12/20                                      (  12/26/19) ---------------------------------------------------------------------- Anatomy (Fetus B)  Cranium:               Appears normal         LVOT:                   Appears normal  Cavum:                 Appears normal         Aortic Arch:            Appears normal  Ventricles:            Appears normal         Ductal Arch:            Appears normal  Choroid Plexus:        Appears normal         Diaphragm:              Not well visualized  Cerebellum:            Appears normal         Stomach:                Appears normal, left                                                                        sided  Posterior Fossa:       Appears normal         Abdomen:                Appears normal  Nuchal Fold:           Not applicable (>20    Abdominal Wall:         Not well visualized                         wks GA)  Face:                  Appears normal         Cord Vessels:           Appears normal (3                         (orbits and profile)                           vessel cord)  Lips:                  Appears normal         Kidneys:                Appear normal  Palate:                Not well visualized    Bladder:                 Appears normal  Thoracic:              Appears normal         Spine:  Appears normal  Heart:                 Appears normal; EIF    Upper Extremities:      Visualized  RVOT:                  Appears normal         Lower Extremities:      Appears normal  Other:  Fetus appears to be a female. VC, 3VV visualized. Left foot/heel          visualized. ---------------------------------------------------------------------- Doppler - Fetal Vessels (Fetus B)  Umbilical Artery   S/D     %tile      RI    %tile                             ADFV    RDFV   6.37   > 97.5    0.84   > 97.5                                No      No ---------------------------------------------------------------------- Cervix Uterus Adnexa  Cervix  No measurable cervix. See comments.  Uterus  No abnormality visualized.  Right Ovary  Within normal limits.  Left Ovary  Within normal limits.  Cul De Sac  No free fluid seen.  Adnexa  No abnormality visualized. ---------------------------------------------------------------------- Impression  Twin intrauterine pregnancy with features suggestive of  Diamniotic Dichorionic pregnancy.  Normal anatomy with good amniotic fluid and fetal movement  was observed in Twin A and B.  Suboptimal views of fetal anatomy was obtained as  documented above.  I reviewed the normal nature of today's ultrasound. Ms.  Hiegel conveyed that she is taking low dose aspirin for  preeclampsia prevention.  Today we observed FGR in both Twin A and B. In particular  Twin B had elevated UA Dopplers with no evidence of AEDF  or REDF. Twin A had normal UA Dopplers.  Secondly, a transvaginal exam was performed to assess  suspected a cervical funneling. We first performed a sterile  speculum exam that revealed a closed external os. However,  on TV there was funneling to the external os.  Ms. Newbrough  denied vaginal bleeding or pelvic pain. I discussed the  recommendation to be seen to MAU for monitoring, steroids  and  possible tocolysis. In addition, she needs an NST as she  could not stay for monitoring given today's finding of IUGR.  She plan to return to MAU by  6:30 pm as she has children at  home to care for until her significant other arrives home.  If she is discharged we have scheduled her for weekly testing  with NST and repeat growth in 3 weeks.  We reviewed the sonographic findings and limitations of  ultrasound. The potential risks associated with a twin  gestation were discussed.  This discussion included a review  of the increased risk of miscarriages, anomalies, preterm  labor, and/or delivery, malpresentation, delivery via cesarean  section, gestational diabetes, and/or preeclampsia.  With  regards to fetal risks, there is an increased risk for fetal  growth restriction of one or both twins, preterm labor, and  associated morbidity, and intrauterine fetal demise.  As noted  on today's exam.  We recommend growth scans every 4 weeks starting at 24  weeks with the initation of  weekly antenatal testing in the  form of twice weekly NST or weekly BPP should abnormal  fetal growth or intertwin discordance of greater than 20-25%  is noted.  Following counseling, all questions were addressed. ---------------------------------------------------------------------- Recommendations  To MAU by 6:30 pm  NST, BMZ and possible tocolysis.  Weekly UA Dopplers.  NICU consultation.  I discussed with Dr. Adrian Blackwater who is aware of this plan made  with Ms. Janey Greaser ----------------------------------------------------------------------               Lin Landsman, MD Electronically Signed Final Report   05/08/2020 06:04 pm ----------------------------------------------------------------------  [place] MFM UA ADDL GEST  Result Date: 05/08/2020 ----------------------------------------------------------------------  OBSTETRICS REPORT                       (Signed Final 05/08/2020 06:04 pm)  ---------------------------------------------------------------------- Patient Info  ID #:       [id number]                          D.O.B.:  Nov 22, 1985 (34 yrs)  Name:       Caroline Ingram               Visit Date: 05/08/2020 12:54 pm ---------------------------------------------------------------------- Performed By  Attending:        Lin Landsman      Ref. Address:     [address]                    [place]                                                             [place], [place]                                                             [address]  Performed By:     Eden Lathe BS      Location:         [facility]                    RDMS RVT                                 [facility]  Referred By:  [facility] ---------------------------------------------------------------------- Orders  #  Description                           Code        Ordered By  1  [place] MFM OB DETAIL +14 WK               [id number]    MATTHEW ECKSTAT  2  [place] MFM OB DETAIL ADDL GEST            76811.02    MATTHEW ECKSTAT     +14 WK  3  [place] MFM UA CORD DOPPLER                76820.02    MATTHEW ECKSTAT  4  [place] MFM UA ADDL GEST                   76820.01    MATTHEW ECKSTAT  5  [place] MFM OB TRANSVAGINAL                [id number]     MATTHEW ECKSTAT ----------------------------------------------------------------------  #  Order #                     Accession #                Episode #  1  [id number]                   [id number]                 [id number]  2  [id number]                   [id number]                 [id number]  3  [id number]                   [id number]                 [id number]  4  [id number]                   [id number]                 [id number]  5  [id number]                   [id number]                 [id number]  ---------------------------------------------------------------------- Indications  Antenatal screening for malformations          Z36.3  Encounter for cervical length                  Z36.86  Twin pregnancy, di/di, second trimester        O30.042  Short interval between pregancies, 2nd         O09.892  trimester  Poor obstetric history: Previous preterm       O09.219  delivery, antepartum x 2 (36 weeks)  Poor obstetric history: Previous fetal growth  O09.299  restriction (FGR)  Cervical insufficiency, 2nd                    O34.32  [redacted] weeks gestation of pregnancy  Z3A.26 ---------------------------------------------------------------------- Fetal Evaluation (Fetus A)  Num Of Fetuses:         2  Fetal Heart Rate(bpm):  136  Cardiac Activity:       Observed  Fetal Lie:              Lower Fetus  Presentation:           Variable  Placenta:               Anterior  P. Cord Insertion:      Visualized  Membrane Desc:      Dividing Membrane seen - Dichorionic.  Amniotic Fluid  AFI FV:      Within normal limits                              Largest Pocket(cm)                              4.4 ---------------------------------------------------------------------- Biometry (Fetus A)  BPD:      64.4  mm     G. Age:  26w 0d         31  %    CI:        74.68   %    70 - 86                                                          FL/HC:      18.3   %    18.6 - 20.4  HC:      236.5  mm     G. Age:  25w 5d         11  %    HC/AC:      1.11        1.04 - 1.22  AC:      213.7  mm     G. Age:  25w 6d         29  %    FL/BPD:     67.1   %    71 - 87  FL:       43.2  mm     G. Age:  24w 1d        1.6  %    FL/AC:      20.2   %    20 - 24  HUM:      41.2  mm     G. Age:  25w 0d         12  %  CER:      30.2  mm     G. Age:  26w 2d         53  %  LV:        4.8  mm  CM:        7.2  mm  Est. FW:     788  gm    1 lb 12 oz       9  %     FW Discordancy      0 \ 3 %  ---------------------------------------------------------------------- OB History  Gravidity:    4         Term:  1        Prem:   2        SAB:   0  TOP:          0       Ectopic:  0        Living: 3 ---------------------------------------------------------------------- Gestational Age (Fetus A)  U/S Today:     25w 3d                                        EDD:   08/18/20  Best:          26w 2d     Det. ByMarcella Dubs         EDD:   08/12/20                                      (12/26/19) ---------------------------------------------------------------------- Anatomy (Fetus A)  Cranium:               Appears normal         LVOT:                   Appears normal  Cavum:                 Appears normal         Aortic Arch:            Not well visualized  Ventricles:            Appears normal         Ductal Arch:            Not well visualized  Choroid Plexus:        Appears normal         Diaphragm:              Appears normal  Cerebellum:            Appears normal         Stomach:                Appears normal, left                                                                        sided  Posterior Fossa:       Appears normal         Abdomen:                Appears normal  Nuchal Fold:           Not applicable (>20    Abdominal Wall:         Appears nml (cord                         wks GA)  insert, abd wall)  Face:                  Not well visualized    Cord Vessels:           Appears normal (3                                                                        vessel cord)  Lips:                  Appears normal         Kidneys:                Appear normal  Palate:                Not well visualized    Bladder:                Appears normal  Thoracic:              Appears normal         Spine:                  Appears normal  Heart:                 Appears normal         Upper Extremities:      Visualized                         (4CH, axis, and                          situs)  RVOT:                  Not well visualized    Lower Extremities:      Appears normal  Other:  Fetus appears to be a female.Heels visualized. VC, 3VV ---------------------------------------------------------------------- Doppler - Fetal Vessels (Fetus A)  Umbilical Artery   S/D     %tile      RI    %tile                             ADFV    RDFV   4.38       92    0.77       88                                No      No ---------------------------------------------------------------------- Fetal Evaluation (Fetus B)  Num Of Fetuses:         2  Fetal Heart Rate(bpm):  129  Cardiac Activity:       Observed  Fetal Lie:              Upper Fetus  Presentation:           Transverse, head to maternal left  Placenta:               Right lateral  P. Cord Insertion:  Not well visualized  Membrane Desc:      Dividing Membrane seen - Dichorionic.  Amniotic Fluid  AFI FV:      Within normal limits                              Largest Pocket(cm)                              4.4 ---------------------------------------------------------------------- Biometry (Fetus B)  BPD:      64.3  mm     G. Age:  26w 0d         30  %    CI:        74.81   %    70 - 86                                                          FL/HC:      19.2   %    18.6 - 20.4  HC:      235.9  mm     G. Age:  25w 4d         10  %    HC/AC:      1.16        1.04 - 1.22  AC:      203.6  mm     G. Age:  25w 0d          9  %    FL/BPD:     70.3   %    71 - 87  FL:       45.2  mm     G. Age:  25w 0d          7  %    FL/AC:      22.2   %    20 - 24  HUM:      39.7  mm     G. Age:  24w 1d        < 5  %  CER:      29.7  mm     G. Age:  26w 0d         45  %  LV:        4.6  mm  CM:          7  mm  Est. FW:     767  gm    1 lb 11 oz       6  %     FW Discordancy         3  % ---------------------------------------------------------------------- Gestational Age (Fetus B)  U/S Today:     25w 3d                                        EDD:   08/18/20  Best:           26w 2d     Det. ByMarcella Dubs         EDD:   08/12/20                                      (  12/26/19) ---------------------------------------------------------------------- Anatomy (Fetus B)  Cranium:               Appears normal         LVOT:                   Appears normal  Cavum:                 Appears normal         Aortic Arch:            Appears normal  Ventricles:            Appears normal         Ductal Arch:            Appears normal  Choroid Plexus:        Appears normal         Diaphragm:              Not well visualized  Cerebellum:            Appears normal         Stomach:                Appears normal, left                                                                        sided  Posterior Fossa:       Appears normal         Abdomen:                Appears normal  Nuchal Fold:           Not applicable (>20    Abdominal Wall:         Not well visualized                         wks GA)  Face:                  Appears normal         Cord Vessels:           Appears normal (3                         (orbits and profile)                           vessel cord)  Lips:                  Appears normal         Kidneys:                Appear normal  Palate:                Not well visualized    Bladder:                Appears normal  Thoracic:              Appears normal         Spine:  Appears normal  Heart:                 Appears normal; EIF    Upper Extremities:      Visualized  RVOT:                  Appears normal         Lower Extremities:      Appears normal  Other:  Fetus appears to be a female. VC, 3VV visualized. Left foot/heel          visualized. ---------------------------------------------------------------------- Doppler - Fetal Vessels (Fetus B)  Umbilical Artery   S/D     %tile      RI    %tile                             ADFV    RDFV   6.37   > 97.5    0.84   > 97.5                                No      No  ---------------------------------------------------------------------- Cervix Uterus Adnexa  Cervix  No measurable cervix. See comments.  Uterus  No abnormality visualized.  Right Ovary  Within normal limits.  Left Ovary  Within normal limits.  Cul De Sac  No free fluid seen.  Adnexa  No abnormality visualized. ---------------------------------------------------------------------- Impression  Twin intrauterine pregnancy with features suggestive of  Diamniotic Dichorionic pregnancy.  Normal anatomy with good amniotic fluid and fetal movement  was observed in Twin A and B.  Suboptimal views of fetal anatomy was obtained as  documented above.  I reviewed the normal nature of today's ultrasound. Ms.  Sick conveyed that she is taking low dose aspirin for  preeclampsia prevention.  Today we observed FGR in both Twin A and B. In particular  Twin B had elevated UA Dopplers with no evidence of AEDF  or REDF. Twin A had normal UA Dopplers.  Secondly, a transvaginal exam was performed to assess  suspected a cervical funneling. We first performed a sterile  speculum exam that revealed a closed external os. However,  on TV there was funneling to the external os.  Ms. Wolman  denied vaginal bleeding or pelvic pain. I discussed the  recommendation to be seen to MAU for monitoring, steroids  and possible tocolysis. In addition, she needs an NST as she  could not stay for monitoring given today's finding of IUGR.  She plan to return to MAU by  6:30 pm as she has children at  home to care for until her significant other arrives home.  If she is discharged we have scheduled her for weekly testing  with NST and repeat growth in 3 weeks.  We reviewed the sonographic findings and limitations of  ultrasound. The potential risks associated with a twin  gestation were discussed.  This discussion included a review  of the increased risk of miscarriages, anomalies, preterm  labor, and/or delivery, malpresentation, delivery via cesarean   section, gestational diabetes, and/or preeclampsia.  With  regards to fetal risks, there is an increased risk for fetal  growth restriction of one or both twins, preterm labor, and  associated morbidity, and intrauterine fetal demise.  As noted  on today's exam.  We recommend growth scans every 4 weeks starting at 24  weeks with the initation  of weekly antenatal testing in the  form of twice weekly NST or weekly BPP should abnormal  fetal growth or intertwin discordance of greater than 20-25%  is noted.  Following counseling, all questions were addressed. ---------------------------------------------------------------------- Recommendations  To MAU by 6:30 pm  NST, BMZ and possible tocolysis.  Weekly UA Dopplers.  NICU consultation.  I discussed with Dr. Adrian Blackwater who is aware of this plan made  with Ms. Janey Greaser ----------------------------------------------------------------------               Lin Landsman, MD Electronically Signed Final Report   05/08/2020 06:04 pm ----------------------------------------------------------------------  [place] MFM UA CORD DOPPLER  Result Date: 05/08/2020 ----------------------------------------------------------------------  OBSTETRICS REPORT                       (Signed Final 05/08/2020 06:04 pm) ---------------------------------------------------------------------- Patient Info  ID #:       [id number]                          D.O.B.:  05-05-1985 (34 yrs)  Name:       [facility]               Visit Date: 05/08/2020 12:54 pm ---------------------------------------------------------------------- Performed By  Attending:        Lin Landsman      Ref. Address:     [address]                    [place]                                                             [place], [place]                                                             [address]  Performed By:     Eden Lathe BS      Location:         [facility] RVT                                  [facility]  Referred By:  [facility] ---------------------------------------------------------------------- Orders  #  Description                           Code        Ordered By  1  [place] MFM OB DETAIL +14 WK               [id number]    MATTHEW ECKSTAT  2  [place] MFM OB DETAIL ADDL GEST            76811.02    MATTHEW ECKSTAT     +14 WK  3  [place] MFM UA CORD DOPPLER                76820.02    MATTHEW ECKSTAT  4  [place] MFM UA ADDL GEST                   76820.01    MATTHEW ECKSTAT  5  [place] MFM OB TRANSVAGINAL                [id number]     MATTHEW ECKSTAT ----------------------------------------------------------------------  #  Order #                     Accession #                Episode #  1  [id number]                   [id number]                 [id number]  2  [id number]                   [id number]                 [id number]  3  [id number]                   [id number]                 [id number]  4  [id number]                   [id number]                 [id number]  5  [id number]                   [id number]                 [id number] ---------------------------------------------------------------------- Indications  Antenatal screening for malformations          Z36.3  Encounter for cervical length                  Z36.86  Twin pregnancy, di/di, second trimester        O30.042  Short interval between pregancies, 2nd         O09.892  trimester  Poor obstetric history: Previous preterm       O09.219  delivery, antepartum x 2 (36 weeks)  Poor obstetric history: Previous fetal growth  O09.299  restriction (FGR)  Cervical insufficiency, 2nd                    O34.32  [redacted] weeks gestation of pregnancy  Z3A.26 ---------------------------------------------------------------------- Fetal Evaluation (Fetus A)  Num Of Fetuses:         2  Fetal Heart  Rate(bpm):  136  Cardiac Activity:       Observed  Fetal Lie:              Lower Fetus  Presentation:           Variable  Placenta:               Anterior  P. Cord Insertion:      Visualized  Membrane Desc:      Dividing Membrane seen - Dichorionic.  Amniotic Fluid  AFI FV:      Within normal limits                              Largest Pocket(cm)                              4.4 ---------------------------------------------------------------------- Biometry (Fetus A)  BPD:      64.4  mm     G. Age:  26w 0d         31  %    CI:        74.68   %    70 - 86                                                          FL/HC:      18.3   %    18.6 - 20.4  HC:      236.5  mm     G. Age:  25w 5d         11  %    HC/AC:      1.11        1.04 - 1.22  AC:      213.7  mm     G. Age:  25w 6d         29  %    FL/BPD:     67.1   %    71 - 87  FL:       43.2  mm     G. Age:  24w 1d        1.6  %    FL/AC:      20.2   %    20 - 24  HUM:      41.2  mm     G. Age:  25w 0d         12  %  CER:      30.2  mm     G. Age:  26w 2d         53  %  LV:        4.8  mm  CM:        7.2  mm  Est. FW:     788  gm    1 lb 12 oz       9  %     FW Discordancy      0 \ 3 % ---------------------------------------------------------------------- OB History  Gravidity:    4         Term:  1        Prem:   2        SAB:   0  TOP:          0       Ectopic:  0        Living: 3 ---------------------------------------------------------------------- Gestational Age (Fetus A)  U/S Today:     25w 3d                                        EDD:   08/18/20  Best:          26w 2d     Det. ByMarcella Dubs         EDD:   08/12/20                                      (12/26/19) ---------------------------------------------------------------------- Anatomy (Fetus A)  Cranium:               Appears normal         LVOT:                   Appears normal  Cavum:                 Appears normal         Aortic Arch:            Not well visualized  Ventricles:             Appears normal         Ductal Arch:            Not well visualized  Choroid Plexus:        Appears normal         Diaphragm:              Appears normal  Cerebellum:            Appears normal         Stomach:                Appears normal, left                                                                        sided  Posterior Fossa:       Appears normal         Abdomen:                Appears normal  Nuchal Fold:           Not applicable (>20    Abdominal Wall:         Appears nml (cord                         wks GA)  insert, abd wall)  Face:                  Not well visualized    Cord Vessels:           Appears normal (3                                                                        vessel cord)  Lips:                  Appears normal         Kidneys:                Appear normal  Palate:                Not well visualized    Bladder:                Appears normal  Thoracic:              Appears normal         Spine:                  Appears normal  Heart:                 Appears normal         Upper Extremities:      Visualized                         (4CH, axis, and                         situs)  RVOT:                  Not well visualized    Lower Extremities:      Appears normal  Other:  Fetus appears to be a female.Heels visualized. VC, 3VV ---------------------------------------------------------------------- Doppler - Fetal Vessels (Fetus A)  Umbilical Artery   S/D     %tile      RI    %tile                             ADFV    RDFV   4.38       92    0.77       88                                No      No ---------------------------------------------------------------------- Fetal Evaluation (Fetus B)  Num Of Fetuses:         2  Fetal Heart Rate(bpm):  129  Cardiac Activity:       Observed  Fetal Lie:              Upper Fetus  Presentation:           Transverse, head to maternal left  Placenta:               Right lateral  P. Cord Insertion:      Not  well  visualized  Membrane Desc:      Dividing Membrane seen - Dichorionic.  Amniotic Fluid  AFI FV:      Within normal limits                              Largest Pocket(cm)                              4.4 ---------------------------------------------------------------------- Biometry (Fetus B)  BPD:      64.3  mm     G. Age:  26w 0d         30  %    CI:        74.81   %    70 - 86                                                          FL/HC:      19.2   %    18.6 - 20.4  HC:      235.9  mm     G. Age:  25w 4d         10  %    HC/AC:      1.16        1.04 - 1.22  AC:      203.6  mm     G. Age:  25w 0d          9  %    FL/BPD:     70.3   %    71 - 87  FL:       45.2  mm     G. Age:  25w 0d          7  %    FL/AC:      22.2   %    20 - 24  HUM:      39.7  mm     G. Age:  24w 1d        < 5  %  CER:      29.7  mm     G. Age:  26w 0d         45  %  LV:        4.6  mm  CM:          7  mm  Est. FW:     767  gm    1 lb 11 oz       6  %     FW Discordancy         3  % ---------------------------------------------------------------------- Gestational Age (Fetus B)  U/S Today:     25w 3d                                        EDD:   08/18/20  Best:          26w 2d     Det. ByMarcella Dubs         EDD:   08/12/20                                      (  12/26/19) ---------------------------------------------------------------------- Anatomy (Fetus B)  Cranium:               Appears normal         LVOT:                   Appears normal  Cavum:                 Appears normal         Aortic Arch:            Appears normal  Ventricles:            Appears normal         Ductal Arch:            Appears normal  Choroid Plexus:        Appears normal         Diaphragm:              Not well visualized  Cerebellum:            Appears normal         Stomach:                Appears normal, left                                                                        sided  Posterior Fossa:       Appears normal         Abdomen:                 Appears normal  Nuchal Fold:           Not applicable (>20    Abdominal Wall:         Not well visualized                         wks GA)  Face:                  Appears normal         Cord Vessels:           Appears normal (3                         (orbits and profile)                           vessel cord)  Lips:                  Appears normal         Kidneys:                Appear normal  Palate:                Not well visualized    Bladder:                Appears normal  Thoracic:              Appears normal         Spine:  Appears normal  Heart:                 Appears normal; EIF    Upper Extremities:      Visualized  RVOT:                  Appears normal         Lower Extremities:      Appears normal  Other:  Fetus appears to be a female. VC, 3VV visualized. Left foot/heel          visualized. ---------------------------------------------------------------------- Doppler - Fetal Vessels (Fetus B)  Umbilical Artery   S/D     %tile      RI    %tile                             ADFV    RDFV   6.37   > 97.5    0.84   > 97.5                                No      No ---------------------------------------------------------------------- Cervix Uterus Adnexa  Cervix  No measurable cervix. See comments.  Uterus  No abnormality visualized.  Right Ovary  Within normal limits.  Left Ovary  Within normal limits.  Cul De Sac  No free fluid seen.  Adnexa  No abnormality visualized. ---------------------------------------------------------------------- Impression  Twin intrauterine pregnancy with features suggestive of  Diamniotic Dichorionic pregnancy.  Normal anatomy with good amniotic fluid and fetal movement  was observed in Twin A and B.  Suboptimal views of fetal anatomy was obtained as  documented above.  I reviewed the normal nature of today's ultrasound. Ms.  Atienza conveyed that she is taking low dose aspirin for  preeclampsia prevention.  Today we observed FGR in both Twin A and B. In particular   Twin B had elevated UA Dopplers with no evidence of AEDF  or REDF. Twin A had normal UA Dopplers.  Secondly, a transvaginal exam was performed to assess  suspected a cervical funneling. We first performed a sterile  speculum exam that revealed a closed external os. However,  on TV there was funneling to the external os.  Ms. Mailhot  denied vaginal bleeding or pelvic pain. I discussed the  recommendation to be seen to MAU for monitoring, steroids  and possible tocolysis. In addition, she needs an NST as she  could not stay for monitoring given today's finding of IUGR.  She plan to return to MAU by  6:30 pm as she has children at  home to care for until her significant other arrives home.  If she is discharged we have scheduled her for weekly testing  with NST and repeat growth in 3 weeks.  We reviewed the sonographic findings and limitations of  ultrasound. The potential risks associated with a twin  gestation were discussed.  This discussion included a review  of the increased risk of miscarriages, anomalies, preterm  labor, and/or delivery, malpresentation, delivery via cesarean  section, gestational diabetes, and/or preeclampsia.  With  regards to fetal risks, there is an increased risk for fetal  growth restriction of one or both twins, preterm labor, and  associated morbidity, and intrauterine fetal demise.  As noted  on today's exam.  We recommend growth scans every 4 weeks starting at 24  weeks with the initation of  weekly antenatal testing in the  form of twice weekly NST or weekly BPP should abnormal  fetal growth or intertwin discordance of greater than 20-25%  is noted.  Following counseling, all questions were addressed. ---------------------------------------------------------------------- Recommendations  To MAU by 6:30 pm  NST, BMZ and possible tocolysis.  Weekly UA Dopplers.  NICU consultation.  I discussed with Dr. Adrian Blackwater who is aware of this plan made  with Ms. Janey Greaser  ----------------------------------------------------------------------               Lin Landsman, MD Electronically Signed Final Report   05/08/2020 06:04 pm ----------------------------------------------------------------------   Future Appointments  Date Time Provider Department [facility]  05/15/2020  1:45 PM [facility] US4 [facility]  05/15/2020  3:15 PM [facility] NST [facility]  05/18/2020  9:15 AM [facility]  05/21/2020  3:35 PM Hermina Staggers, MD [facility]  05/22/2020  3:15 PM [facility] NST [facility]  05/22/2020  4:00 PM [facility] US1 [facility]  05/29/2020 12:45 PM [facility] US4 [facility]  05/29/2020  2:15 PM [facility] NST [facility]    Discharge Condition: Stable  Discharge disposition: 01-Home or Self Care       Discharge Instructions    Discharge activity: Bedrest   Complete by: As directed    Can go to bathroom and move around house as needed.   Discharge diet:  No restrictions   Complete by: As directed    Do not have sex or do anything that might make you have an orgasm   Complete by: As directed    Fetal Kick Count:  Lie on our left side for one hour after a meal, and count the number of times your baby kicks.  If it is less than 5 times, get up, move around and drink some juice.  Repeat the test 30 minutes later.  If it is still less than 5 kicks in an hour, notify your doctor.   Complete by: As directed    Notify physician for leaking of fluid   Complete by: As directed    Notify physician for uterine contractions.  These may be painless and feel like the uterus is tightening or the baby is  "balling up"   Complete by: As directed    Notify physician for vaginal bleeding   Complete by: As directed    PRETERM LABOR:  Includes any of the follwing symptoms that occur between 20 - [redacted] weeks gestation.  If these symptoms are not stopped, preterm labor can result in preterm delivery, placing your baby at risk   Complete by: As directed       Allergies as of 05/11/2020      Reactions   Chocolate Anaphylaxis   Shrimp [shellfish Allergy] Anaphylaxis   Iodine Itching      Medication List    TAKE these medications   acetaminophen 325 MG tablet Commonly known as: Tylenol Take 2 tablets (650 mg total) by mouth every 6 (six) hours as needed (for pain scale < 4).   albuterol 108 (90 Base) MCG/ACT inhaler Commonly known as: VENTOLIN HFA Inhale 2 puffs into the lungs every 4 (four) hours as needed for wheezing or shortness of breath.   amLODipine 2.5 MG tablet Commonly known as: NORVASC Take 2.5 mg by mouth daily. What changed: Another medication with the same name was removed. Continue taking this medication, and follow the directions you see here.   Comfort Fit Maternity Supp Med Misc 1 Device by Does not  apply route as needed.   cyclobenzaprine 5 MG tablet Commonly known as: FLEXERIL Take 1 tablet (5 mg total) by mouth 3 (three) times daily as needed for muscle spasms.   docusate sodium 100 MG capsule Commonly known as: COLACE Take 1 capsule (100 mg total) by mouth 2 (two) times daily as needed for mild constipation.   Dulera 100-5 MCG/ACT Aero Generic drug: mometasone-formoterol Inhale 2 puffs into the lungs 2 (two) times daily as needed for wheezing or shortness of breath.   famotidine 20 MG tablet Commonly known as: Pepcid Take 1 tablet (20 mg total) by mouth 2 (two) times daily.   guaiFENesin 100 MG/5ML Soln Commonly known as: ROBITUSSIN Take 5 mLs (100 mg total) by mouth every 4 (four) hours as needed for cough or to loosen phlegm.   Pill Splitter Misc 1 Device by Does not apply route as needed.   polyethylene glycol powder 17 GM/SCOOP powder Commonly known as: GLYCOLAX/MIRALAX Take 17 g by mouth daily as needed. What changed: reasons to take this   prenatal vitamin w/FE, FA 27-1 MG Tabs tablet Take 1 tablet by mouth daily at 12 noon.   prochlorperazine 10 MG tablet Commonly known as:  COMPAZINE Take 1 tablet (10 mg total) by mouth every 8 (eight) hours as needed for nausea or vomiting.   progesterone 200 MG capsule Commonly known as: PROMETRIUM Place 1 capsule (200 mg total) vaginally at bedtime.   sertraline 100 MG tablet Commonly known as: ZOLOFT Take 150 mg by mouth at bedtime.   Sertraline HCl 150 MG Caps Take 150 mg by mouth daily.        Total discharge time: 20 minutes   Signed: Jaynie Collins M.D. 05/11/2020, 10:41 AM

## 2020-05-13 ENCOUNTER — Telehealth: Payer: Self-pay | Admitting: *Deleted

## 2020-05-13 NOTE — Telephone Encounter (Signed)
Voicemail message left by pt stating that she thinks she may have lost her mucous plug. Also she was having UC's last night however none today except that she is feeling the babies "balling up". Pt also stated that there has been less movement today. She wanted to know if she needs to go back to the hospital since the babies are young and underweight. I called pt to discuss her concerns and she stated that she is feeling better. Pt was advised that the "balling up" sensation can be contractions and even if she is not having pain, she should go to the hospital if that is occurring more than 6 times in an hour. Pt has been taking a nap and so cannot state updated information about fetal movement @ present. I advised pt that after she gets up and has a snack, she should be aware of the movement. If it continues to feel significantly less this evening, she should return to the hospital for evaluation. Pt voiced understanding.

## 2020-05-15 ENCOUNTER — Ambulatory Visit (HOSPITAL_BASED_OUTPATIENT_CLINIC_OR_DEPARTMENT_OTHER): Payer: Medicaid Other

## 2020-05-15 ENCOUNTER — Ambulatory Visit: Payer: Medicaid Other | Admitting: *Deleted

## 2020-05-15 ENCOUNTER — Other Ambulatory Visit: Payer: Self-pay

## 2020-05-15 ENCOUNTER — Encounter: Payer: Self-pay | Admitting: *Deleted

## 2020-05-15 DIAGNOSIS — U071 COVID-19: Secondary | ICD-10-CM | POA: Insufficient documentation

## 2020-05-15 DIAGNOSIS — Z3689 Encounter for other specified antenatal screening: Secondary | ICD-10-CM

## 2020-05-15 DIAGNOSIS — Z3A27 27 weeks gestation of pregnancy: Secondary | ICD-10-CM

## 2020-05-15 DIAGNOSIS — O98512 Other viral diseases complicating pregnancy, second trimester: Secondary | ICD-10-CM

## 2020-05-15 DIAGNOSIS — O365990 Maternal care for other known or suspected poor fetal growth, unspecified trimester, not applicable or unspecified: Secondary | ICD-10-CM | POA: Insufficient documentation

## 2020-05-15 DIAGNOSIS — O0990 Supervision of high risk pregnancy, unspecified, unspecified trimester: Secondary | ICD-10-CM

## 2020-05-15 DIAGNOSIS — O09292 Supervision of pregnancy with other poor reproductive or obstetric history, second trimester: Secondary | ICD-10-CM

## 2020-05-15 DIAGNOSIS — O09892 Supervision of other high risk pregnancies, second trimester: Secondary | ICD-10-CM | POA: Diagnosis not present

## 2020-05-15 DIAGNOSIS — O09212 Supervision of pregnancy with history of pre-term labor, second trimester: Secondary | ICD-10-CM | POA: Diagnosis not present

## 2020-05-15 DIAGNOSIS — O30042 Twin pregnancy, dichorionic/diamniotic, second trimester: Secondary | ICD-10-CM | POA: Diagnosis not present

## 2020-05-15 DIAGNOSIS — O322XX2 Maternal care for transverse and oblique lie, fetus 2: Secondary | ICD-10-CM

## 2020-05-15 DIAGNOSIS — O4702 False labor before 37 completed weeks of gestation, second trimester: Secondary | ICD-10-CM | POA: Insufficient documentation

## 2020-05-15 DIAGNOSIS — O3430 Maternal care for cervical incompetence, unspecified trimester: Secondary | ICD-10-CM | POA: Insufficient documentation

## 2020-05-15 DIAGNOSIS — O321XX1 Maternal care for breech presentation, fetus 1: Secondary | ICD-10-CM

## 2020-05-15 DIAGNOSIS — O3432 Maternal care for cervical incompetence, second trimester: Secondary | ICD-10-CM

## 2020-05-15 LAB — CBC/D/PLT+RPR+RH+ABO+RUB AB...
Antibody Screen: NEGATIVE
Basophils Absolute: 0 10*3/uL (ref 0.0–0.2)
Basos: 0 %
EOS (ABSOLUTE): 0.1 10*3/uL (ref 0.0–0.4)
Eos: 2 %
Hematocrit: 32 % — ABNORMAL LOW (ref 34.0–46.6)
Hemoglobin: 10.2 g/dL — ABNORMAL LOW (ref 11.1–15.9)
Hepatitis B Surface Ag: NEGATIVE
Immature Grans (Abs): 0 10*3/uL (ref 0.0–0.1)
Immature Granulocytes: 0 %
Lymphocytes Absolute: 2.9 10*3/uL (ref 0.7–3.1)
Lymphs: 36 %
MCH: 28.4 pg (ref 26.6–33.0)
MCHC: 31.9 g/dL (ref 31.5–35.7)
MCV: 89 fL (ref 79–97)
Monocytes Absolute: 0.5 10*3/uL (ref 0.1–0.9)
Monocytes: 7 %
Neutrophils Absolute: 4.5 10*3/uL (ref 1.4–7.0)
Neutrophils: 55 %
Platelets: 250 10*3/uL (ref 150–450)
RBC: 3.59 x10E6/uL — ABNORMAL LOW (ref 3.77–5.28)
RDW: 13.3 % (ref 11.7–15.4)
RPR Ser Ql: NONREACTIVE
Rh Factor: POSITIVE
Rubella Antibodies, IGG: 1.58 index (ref >0.99)
Varicella zoster IgG: 148 index — ABNORMAL LOW (ref >165)
WBC: 8.1 10*3/uL (ref 3.4–10.8)

## 2020-05-15 LAB — SPECIMEN STATUS REPORT

## 2020-05-15 NOTE — Procedures (Signed)
Yachet YOLANDA HUFFSTETLER 07-13-1985 [redacted]w[redacted]d   Fetus B Non-Stress Test Interpretation for 05/15/20  Indication: IUGR  Oct 27, 1985 [redacted]w[redacted]d  Fetus A Non-Stress Test Interpretation for 05/15/20  Indication: IUGR  Fetal Heart Rate A Mode: External Baseline Rate (A): 130 bpm Variability: Moderate Accelerations: 10 x 10 Decelerations: None Multiple birth?: Yes  Uterine Activity Mode: Palpation,Toco Contraction Frequency (min): None Resting Tone Palpated: Relaxed Resting Time: Adequate  Interpretation (Fetal Testing) Nonstress Test Interpretation: Reactive Overall Impression: Reassuring for gestational age Comments: Dr. Grace Bushy reviewed tracing.    Fetal Heart Rate Fetus B Mode: External Baseline Rate (B): 130 BPM Variability: Moderate Accelerations: 10 x 10 Decelerations: None  Uterine Activity Mode: Palpation,Toco Contraction Frequency (min): None Resting Tone Palpated: Relaxed Resting Time: Adequate  Interpretation (Baby B - Fetal Testing) Nonstress Test Interpretation (Baby B): Reactive Overall Impression (Baby B): Reassuring for gestational age Comments (Baby B): Dr. Grace Bushy reviewed tracing.

## 2020-05-16 ENCOUNTER — Inpatient Hospital Stay (HOSPITAL_COMMUNITY)
Admission: AD | Admit: 2020-05-16 | Discharge: 2020-05-18 | DRG: 805 | Disposition: A | Discharge status: Home / Self Care | Payer: Medicaid Other | Attending: Family Medicine | Admitting: Family Medicine

## 2020-05-16 ENCOUNTER — Encounter (HOSPITAL_COMMUNITY): Payer: Self-pay | Admitting: Family Medicine

## 2020-05-16 ENCOUNTER — Other Ambulatory Visit: Payer: Self-pay

## 2020-05-16 DIAGNOSIS — O3430 Maternal care for cervical incompetence, unspecified trimester: Secondary | ICD-10-CM

## 2020-05-16 DIAGNOSIS — F17210 Nicotine dependence, cigarettes, uncomplicated: Secondary | ICD-10-CM | POA: Diagnosis present

## 2020-05-16 DIAGNOSIS — O9852 Other viral diseases complicating childbirth: Secondary | ICD-10-CM | POA: Diagnosis present

## 2020-05-16 DIAGNOSIS — O4702 False labor before 37 completed weeks of gestation, second trimester: Secondary | ICD-10-CM

## 2020-05-16 DIAGNOSIS — U071 COVID-19: Secondary | ICD-10-CM

## 2020-05-16 DIAGNOSIS — Z3A27 27 weeks gestation of pregnancy: Secondary | ICD-10-CM

## 2020-05-16 DIAGNOSIS — O99344 Other mental disorders complicating childbirth: Secondary | ICD-10-CM | POA: Diagnosis present

## 2020-05-16 DIAGNOSIS — O3432 Maternal care for cervical incompetence, second trimester: Secondary | ICD-10-CM

## 2020-05-16 DIAGNOSIS — F32A Depression, unspecified: Secondary | ICD-10-CM | POA: Diagnosis present

## 2020-05-16 DIAGNOSIS — O30042 Twin pregnancy, dichorionic/diamniotic, second trimester: Secondary | ICD-10-CM | POA: Diagnosis present

## 2020-05-16 DIAGNOSIS — O328XX2 Maternal care for other malpresentation of fetus, fetus 2: Secondary | ICD-10-CM

## 2020-05-16 DIAGNOSIS — O321XX2 Maternal care for breech presentation, fetus 2: Secondary | ICD-10-CM | POA: Diagnosis present

## 2020-05-16 DIAGNOSIS — F3181 Bipolar II disorder: Secondary | ICD-10-CM | POA: Diagnosis present

## 2020-05-16 DIAGNOSIS — O99334 Smoking (tobacco) complicating childbirth: Secondary | ICD-10-CM | POA: Diagnosis present

## 2020-05-16 DIAGNOSIS — O365990 Maternal care for other known or suspected poor fetal growth, unspecified trimester, not applicable or unspecified: Secondary | ICD-10-CM

## 2020-05-16 DIAGNOSIS — O365922 Maternal care for other known or suspected poor fetal growth, second trimester, fetus 2: Secondary | ICD-10-CM

## 2020-05-16 DIAGNOSIS — O10919 Unspecified pre-existing hypertension complicating pregnancy, unspecified trimester: Secondary | ICD-10-CM | POA: Diagnosis present

## 2020-05-16 DIAGNOSIS — Z3046 Encounter for surveillance of implantable subdermal contraceptive: Secondary | ICD-10-CM

## 2020-05-16 DIAGNOSIS — O98512 Other viral diseases complicating pregnancy, second trimester: Secondary | ICD-10-CM | POA: Diagnosis present

## 2020-05-16 DIAGNOSIS — O365920 Maternal care for other known or suspected poor fetal growth, second trimester, not applicable or unspecified: Secondary | ICD-10-CM | POA: Diagnosis present

## 2020-05-16 DIAGNOSIS — O1002 Pre-existing essential hypertension complicating childbirth: Principal | ICD-10-CM | POA: Diagnosis present

## 2020-05-16 DIAGNOSIS — O365921 Maternal care for other known or suspected poor fetal growth, second trimester, fetus 1: Secondary | ICD-10-CM

## 2020-05-16 DIAGNOSIS — O0990 Supervision of high risk pregnancy, unspecified, unspecified trimester: Secondary | ICD-10-CM

## 2020-05-16 DIAGNOSIS — O26893 Other specified pregnancy related conditions, third trimester: Secondary | ICD-10-CM | POA: Diagnosis present

## 2020-05-16 LAB — TYPE AND SCREEN
ABO/RH(D): A POS
Antibody Screen: NEGATIVE

## 2020-05-16 LAB — CBC
HCT: 32 % — ABNORMAL LOW (ref 36.0–46.0)
Hemoglobin: 10.1 g/dL — ABNORMAL LOW (ref 12.0–15.0)
MCH: 27.6 pg (ref 26.0–34.0)
MCHC: 31.6 g/dL (ref 30.0–36.0)
MCV: 87.4 fL (ref 80.0–100.0)
Platelets: 254 10*3/uL (ref 150–400)
RBC: 3.66 MIL/uL — ABNORMAL LOW (ref 3.87–5.11)
RDW: 13.7 % (ref 11.5–15.5)
WBC: 9.9 10*3/uL (ref 4.0–10.5)
nRBC: 0 % (ref 0.0–0.2)

## 2020-05-16 LAB — RPR: RPR Ser Ql: NONREACTIVE

## 2020-05-16 MED ORDER — PRENATAL MULTIVITAMIN CH
1.0000 | ORAL_TABLET | Freq: Every day | ORAL | Status: DC
  Administered 2020-05-16 – 2020-05-18 (×3): 1 via ORAL
  Filled 2020-05-16 (×3): qty 1

## 2020-05-16 MED ORDER — LACTATED RINGERS IV SOLN: 500.0000 mL | INTRAVENOUS | Status: DC | PRN

## 2020-05-16 MED ORDER — OXYTOCIN-SODIUM CHLORIDE 30-0.9 UT/500ML-% IV SOLN
2.5000 [IU]/h | INTRAVENOUS | Status: DC
  Administered 2020-05-16: 2.5 [IU]/h via INTRAVENOUS
  Filled 2020-05-16: qty 500

## 2020-05-16 MED ORDER — ONDANSETRON HCL 4 MG/2ML IJ SOLN: 4.0000 mg | INTRAMUSCULAR | Status: DC | PRN

## 2020-05-16 MED ORDER — FENTANYL CITRATE (PF) 100 MCG/2ML IJ SOLN: 50.0000 ug | INTRAMUSCULAR | Status: DC | PRN

## 2020-05-16 MED ORDER — ONDANSETRON HCL 4 MG/2ML IJ SOLN: 4.0000 mg | Freq: Four times a day (QID) | INTRAMUSCULAR | Status: DC | PRN

## 2020-05-16 MED ORDER — LIDOCAINE HCL (PF) 1 % IJ SOLN: 30.0000 mL | INTRAMUSCULAR | Status: DC | PRN

## 2020-05-16 MED ORDER — DIBUCAINE (PERIANAL) 1 % EX OINT: 1.0000 "application " | TOPICAL_OINTMENT | CUTANEOUS | Status: DC | PRN

## 2020-05-16 MED ORDER — SOD CITRATE-CITRIC ACID 500-334 MG/5ML PO SOLN: 30.0000 mL | ORAL | Status: DC | PRN

## 2020-05-16 MED ORDER — ACETAMINOPHEN 325 MG PO TABS: 650.0000 mg | ORAL_TABLET | ORAL | Status: DC | PRN

## 2020-05-16 MED ORDER — IBUPROFEN 600 MG PO TABS
600.0000 mg | ORAL_TABLET | Freq: Four times a day (QID) | ORAL | Status: DC
Start: 2020-05-16 — End: 2020-05-18
  Administered 2020-05-16 – 2020-05-18 (×10): 600 mg via ORAL
  Filled 2020-05-16 (×10): qty 1

## 2020-05-16 MED ORDER — SIMETHICONE 80 MG PO CHEW: 80.0000 mg | CHEWABLE_TABLET | ORAL | Status: DC | PRN

## 2020-05-16 MED ORDER — ACETAMINOPHEN 325 MG PO TABS
650.0000 mg | ORAL_TABLET | ORAL | Status: DC | PRN
  Administered 2020-05-16: 650 mg via ORAL
  Filled 2020-05-16: qty 2

## 2020-05-16 MED ORDER — WITCH HAZEL-GLYCERIN EX PADS: 1.0000 "application " | MEDICATED_PAD | CUTANEOUS | Status: DC | PRN

## 2020-05-16 MED ORDER — ZOLPIDEM TARTRATE 5 MG PO TABS: 5.0000 mg | ORAL_TABLET | Freq: Every evening | ORAL | Status: DC | PRN

## 2020-05-16 MED ORDER — BENZOCAINE-MENTHOL 20-0.5 % EX AERO: 1.0000 "application " | INHALATION_SPRAY | CUTANEOUS | Status: DC | PRN

## 2020-05-16 MED ORDER — SENNOSIDES-DOCUSATE SODIUM 8.6-50 MG PO TABS
2.0000 | ORAL_TABLET | ORAL | Status: DC
Start: 2020-05-16 — End: 2020-05-18
  Administered 2020-05-16 – 2020-05-18 (×3): 2 via ORAL
  Filled 2020-05-16 (×3): qty 2

## 2020-05-16 MED ORDER — SERTRALINE HCL 50 MG PO TABS
150.0000 mg | ORAL_TABLET | Freq: Every day | ORAL | Status: DC
  Administered 2020-05-16 – 2020-05-17 (×2): 150 mg via ORAL
  Filled 2020-05-16 (×2): qty 1

## 2020-05-16 MED ORDER — DIPHENHYDRAMINE HCL 25 MG PO CAPS: 25.0000 mg | ORAL_CAPSULE | Freq: Four times a day (QID) | ORAL | Status: DC | PRN

## 2020-05-16 MED ORDER — TETANUS-DIPHTH-ACELL PERTUSSIS 5-2.5-18.5 LF-MCG/0.5 IM SUSY: 0.5000 mL | PREFILLED_SYRINGE | Freq: Once | INTRAMUSCULAR | Status: DC

## 2020-05-16 MED ORDER — COCONUT OIL OIL: 1.0000 "application " | TOPICAL_OIL | Status: DC | PRN

## 2020-05-16 MED ORDER — OXYTOCIN BOLUS FROM INFUSION
333.0000 mL | Freq: Once | INTRAVENOUS | Status: AC
  Administered 2020-05-16: 333 mL via INTRAVENOUS

## 2020-05-16 MED ORDER — ONDANSETRON HCL 4 MG PO TABS: 4.0000 mg | ORAL_TABLET | ORAL | Status: DC | PRN

## 2020-05-16 MED ORDER — AMLODIPINE BESYLATE 5 MG PO TABS
5.0000 mg | ORAL_TABLET | Freq: Every day | ORAL | Status: DC
  Administered 2020-05-16 – 2020-05-18 (×3): 5 mg via ORAL
  Filled 2020-05-16 (×3): qty 1

## 2020-05-16 MED ORDER — LACTATED RINGERS IV SOLN: INTRAVENOUS | Status: DC

## 2020-05-16 NOTE — Lactation Note (Signed)
This note was copied from a baby's chart. Lactation Consultation Note  Patient Name: Chee Dimon [id number] Date: 05/16/2020 Reason for consult: Initial assessment;Infant < 6lbs;Multiple gestation;NICU baby;Preterm <34wks Age:34 hours  LC in to visit with P5 Mom of preterm twins in the NICU.  Babies 5 hrs old.  Mom sound asleep while LC set up DEBP and her phone was beeping.   Mom is Covid+ and on precaution for another 2 days.  Spoke with RN about education needed.  RN will be doing vitals and checks and will help Mom initiate double pumping.  NICU book and lactation brochure left in room.  WIC form left in room and RN will have Mom sign for referral for DEBP at discharge.  Mom has Medicaid and she should qualify for [facility] due to [redacted]w[redacted]d IUGR twins in the NICU.  RN stated Mom was wanting babies to get only DBM or MOM.   RN to call [facility] for follow-up later today prn.    Interventions Interventions: DEBP  Lactation Tools Discussed/Used Tools: Pump Breast pump type: Double-Electric Breast Pump Pump Education: Setup, frequency, and cleaning;Milk Storage Initiated by:: Erby Pian RN IBCLC Date initiated:: 05/16/20   Consult Status Consult Status: Follow-up Date: 05/17/20 Follow-up type: In-patient    Judee Clara 05/16/2020, 12:51 PM
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
## 2020-05-16 NOTE — MAU Note (Signed)
Pt presented to MAU via EMS at approximately 05:18 am for ctx increasing in intensity since 03:30 this morning.  Upon arrival pt was c/o severe ctx and urge to push. RN and Gerrit Heck, CNM at bedside on arrival.  Twin B FHR doppler was obtained at 125 bpm, RN was unable to doppler consistent FHR for twin A d/t patient movement. Around 05:25 am a peripheral IV was started and cervical exam performed by Gerrit Heck, CNM and found the patient's cervix to be completely dilated and Twin A to be in breech presentation at +3 station. Dr. Tinnie Gens was called immediately afterwards and updated on the patient status and cervical exam. Labor and Delivery change nurse was called directly after Dr. Shawnie Pons. Per MD verbal order, the patient was transported to Labor and Delivery via stretcher with MAU provider present.  NICU charge nurse was called at 05:26 am, and an imminent delivery was called at 05:27 am.

## 2020-05-16 NOTE — Discharge Summary (Addendum)
Postpartum Discharge Summary    Patient Name: Caroline Ingram DOB: 1985-10-22 MRN: [id number]  Date of admission: 05/16/2020 Delivery date:   Caroline Ingram, Caroline Ingram [facility] [[id number]]  05/16/2020    Caroline Ingram, Caroline Ingram [[id number]]  05/16/2020   Delivering provider:    Sinda, Caroline Ingram [[id number]]  Caroline Ingram, Caroline Ingram [[id number]]  Caroline Ingram   Date of discharge: 05/18/2020  Admitting diagnosis: Preterm labor in third trimester [O60.03] Intrauterine pregnancy: [redacted]w[redacted]d    Secondary diagnosis:  Principal Problem:   Vaginal delivery Active Problems:   Bipolar 2 disorder, [place] depressive [place]   Chronic hypertension affecting pregnancy   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   COVID-19 affecting pregnancy in second trimester   Preterm labor in third trimester  Additional problems: as noted above Discharge diagnosis: Preterm Pregnancy Delivered                                              Post partum procedures: none Augmentation: N/A Complications: None  Hospital course: Onset of Labor With Vaginal Delivery      35y.o. yo [phone] admitted in Active Labor on 05/16/2020. Patient had an uncomplicated labor course as follows: Arrived via EMS fully dilated and pushed for SVD x 2. Please see delivery note.    Patient had an uncomplicated postpartum course.  She is ambulating, tolerating a regular diet, passing flatus, and urinating well. Patient is discharged home in stable condition on 05/18/20.  Newborn Data: [facility] date:   [id number]    Caroline Ingram, [place][[id number]]05/16/2020   Birth time:   Caroline Ingram, [place]0[[id number]]5:44 AM    Caroline Ingram, Caroline Ingram [[id number]]5:51 AM   Apgars: Boy A 2, 5, 6 Boy B 5, 9  Weight:   Caroline Ingram, Caroline Ingram [[id number]]840 g    Caroline Ingram, [place][[id number]]810 g   Magnesium Sulfate received: No BMZ  received: Yes prior hospitalization Rhophylac:N/A MMR:N/A T-DaP: offered prior to discharge Flu: N/A declined Transfusion:No  Physical exam  Vitals:   05/17/20 1335 05/17/20 1426 05/17/20 2116 05/18/20 0542  BP: (!) 141/79 121/68 129/76 116/69  Pulse: 62 62 67 62  Resp:   20 18  Temp:   97.8 F (36.6 C) 98.1 F (36.7 C)  TempSrc:   Oral Oral  SpO2:   100% 99%   General: alert, cooperative and no distress Lochia: appropriate Uterine Fundus: firm Incision: N/A DVT Evaluation: No evidence of DVT seen on physical exam. No cords or calf tenderness. No significant calf/ankle edema. Labs: Lab Results  Component Value Date   WBC 9.9 05/16/2020   HGB 10.1 (L) 05/16/2020   HCT 32.0 (L) 05/16/2020   MCV 87.4 05/16/2020   PLT 254 05/16/2020   CMP Latest Ref Rng & Units 04/22/2020  Glucose 65 - 99 mg/dL 85  BUN 6 - 20 mg/dL 7  Creatinine 0.57 - 1.00 mg/dL 0.43(L)  Sodium 134 - 144 mmol/L 138  Potassium 3.5 - 5.2 mmol/L 4.0  Chloride 96 - 106 mmol/L 105  CO2 20 - 29 mmol/L 21  Calcium 8.7 - 10.2 mg/dL 9.0  Total Protein 6.0 - 8.5 g/dL 6.5  Total Bilirubin 0.0 - 1.2 mg/dL <0.2  Alkaline Phos 44 - 121 IU/L 78  AST 0 - 40 IU/L 11  ALT 0 - 32 IU/L 12   Edinburgh Score: Edinburgh Postnatal Depression Scale Screening Tool 04/26/2019  I have been able to laugh and see the funny side of things. 0  I have looked forward with enjoyment to things. 0  I have blamed myself unnecessarily when things went wrong. 1  I have been anxious or worried for no good reason. 0  I have felt scared or panicky for no good reason. 0  Things have been getting on top of me. 1  I have been so unhappy that I have had difficulty sleeping. 0  I have felt sad or miserable. 1  I have been so unhappy that I have been crying. 1  The thought of harming myself has occurred to me. 0  Edinburgh Postnatal Depression Scale Total 4     After visit meds:  Allergies as of 05/18/2020       Reactions   Chocolate  Anaphylaxis   Shrimp [shellfish Allergy] Anaphylaxis   Iodine Itching        Medication List     STOP taking these medications    Comfort Fit Maternity Supp Med Misc   cyclobenzaprine 5 MG tablet Commonly known as: FLEXERIL   docusate sodium 100 MG capsule Commonly known as: COLACE   famotidine 20 MG tablet Commonly known as: Pepcid   Pill Splitter Misc   prochlorperazine 10 MG tablet Commonly known as: COMPAZINE   progesterone 200 MG capsule Commonly known as: PROMETRIUM       TAKE these medications    acetaminophen 325 MG tablet Commonly known as: Tylenol Take 2 tablets (650 mg total) by mouth every 6 (six) hours as needed (for pain scale < 4).   albuterol 108 (90 Base) MCG/ACT inhaler Commonly known as: VENTOLIN HFA Inhale 2 puffs into the lungs every 4 (four) hours as needed for wheezing or shortness of breath.   amLODipine 5 MG tablet Commonly known as: NORVASC Take 1 tablet (5 mg total) by mouth daily. What changed:   medication strength  how much to take   coconut oil Oil Apply 1 application topically as needed (nipple pain).   Dulera 100-5 MCG/ACT Aero Generic drug: mometasone-formoterol Inhale 2 puffs into the lungs 2 (two) times daily as needed for wheezing or shortness of breath.   guaiFENesin 100 MG/5ML Soln Commonly known as: ROBITUSSIN Take 5 mLs (100 mg total) by mouth every 4 (four) hours as needed for cough or to loosen phlegm.   ibuprofen 600 MG tablet Commonly known as: ADVIL Take 1 tablet (600 mg total) by mouth every 8 (eight) hours as needed for moderate pain or cramping.   polyethylene glycol powder 17 GM/SCOOP powder Commonly known as: GLYCOLAX/MIRALAX Take 17 g by mouth daily as needed. What changed: reasons to take this   prenatal vitamin w/FE, FA 27-1 MG Tabs tablet Take 1 tablet by mouth daily at 12 noon.   sertraline 100 MG tablet Commonly known as: ZOLOFT Take 150 mg by mouth at bedtime. What changed: Another  medication with the same name was removed. Continue taking this medication, and follow the directions you see here.        Discharge home in stable condition Infant Feeding: Bottle Infant Disposition:NICU Discharge instruction: per After Visit Summary and Postpartum booklet. Activity: Advance as tolerated. Pelvic rest for 6 weeks.  Diet: routine diet Future Appointments: Referral to General Surgery for Implanon removal  Future Appointments  Date Time Provider Department  Center  05/22/2020  3:00 PM [facility] NURSE [facility]  05/22/2020  3:15 PM [facility] NST [facility]  05/22/2020  4:00 PM [facility] US1 [facility]  05/29/2020 12:30 PM [facility] NURSE [facility]  05/29/2020 12:45 PM [facility] US4 [facility]  05/29/2020  2:15 PM [facility] NST [facility] [place]   Follow up Visit:    Please schedule this patient for a Virtual postpartum visit in 4 weeks with the following provider: Any provider.  Additional Postpartum F/U:Postpartum Depression checkup and BP check 1 week High risk pregnancy complicated by: HTN and multi-fetal gestation Delivery mode:   Anticipated Birth Control:  depo- canceled Debria Garret, MD 05/18/2020 8:06 AM   Midwife attestation I have seen and examined this patient and agree with above documentation in the resident's note.   Nikira AJANEE BUREN is a 35 y.o. [id number] s/p SVD.  Pain is well controlled. Plan for birth control is Depo-Provera. Method of Feeding: bottle  PE:  Gen: well appearing Heart: reg rate Lungs: normal WOB Fundus firm Ext: no pain, no edema  Recent Labs    05/16/20 0525  HGB 10.1*  HCT 32.0*   Assessment S/p SVD PPD # 2  Plan: - discharge home - postpartum care discussed - f/u in office in 6 weeks for postpartum visit  Julianne Handler, CNM 7:01 PM

## 2020-05-17 NOTE — Progress Notes (Signed)
Post Partum Day 1  Subjective: no complaints, up ad lib, voiding, tolerating PO and + flatus  Objective: Blood pressure 122/72, pulse (!) 52, temperature 98.4 F (36.9 C), temperature source Oral, resp. rate 18, SpO2 100 %, unknown if currently breastfeeding.  Physical Exam:  General: alert, cooperative and no distress Lochia: appropriate Uterine Fundus: firm Incision: n/a DVT Evaluation: No evidence of DVT seen on physical exam.  Recent Labs    05/16/20 0525  HGB 10.1*  HCT 32.0*    Assessment/Plan: Plan for discharge tomorrow, Breastfeeding and Contraception planning outpatient BTL  Lengthy discussion about available NICU resources. Patient desires to see SW for home resources as well.    LOS: 1 day   Rolm Bookbinder CNM 05/17/2020, 10:35 AM

## 2020-05-17 NOTE — Lactation Note (Signed)
This note was copied from a baby's chart. Lactation Consultation Note  Patient Name: Caroline Ingram SWHQP'[id number] Date: 05/17/2020 Reason for consult: Follow-up assessment;NICU baby;Multiple gestation;Preterm <34wks Age:34 hours   Mom will be out of isolation tomorrow 1/31 and will be able to be with her twin infants in NICU.  Mom was not familiar with hand expression so it was reviewed and mom was able to demonstrate well.  3 drops expressed and collected into a colostrum container and then mom began pumping.  While pumping, LC provided education on importance of hand expression and pumping in order to stimulate her body to produce milk so she can provide for her infants.  Mom has been provided firstdroplets.com as a resource for hand expression and pumping.    Mom states she understands about cleaning her pump parts and knows milk can be at room temperature for 4 hours then will need to go into the refrigerator.  LC used the [employer] book as a resource for hand expression.    Mom was encouraged to pump every 2-3 hours, allowing no more than 1 four hour stretch for sleep/rest.  Goal of 8 times in 24 hours.    Initiate pump setting reviewed with mom.    All questions answered.  Mom knows to call out for questions regarding pumping.  [facility] referral signed by mom and fax was sent.   Maternal Data Has patient been taught Hand Expression?: Yes  Feeding    LATCH Score                   Interventions Interventions: DEBP;Hand express  Lactation Tools Discussed/Used WIC Program: Yes Pump Education: Setup, frequency, and cleaning;Milk Storage   Consult Status Consult Status: Follow-up Date: 05/18/20 Follow-up type: In-patient    [name] [facility] 05/17/2020, 8:47 AM
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
## 2020-05-18 ENCOUNTER — Encounter: Payer: Self-pay | Admitting: General Practice

## 2020-05-18 ENCOUNTER — Other Ambulatory Visit (HOSPITAL_COMMUNITY): Payer: Self-pay | Admitting: Certified Nurse Midwife

## 2020-05-18 MED ORDER — CYCLOBENZAPRINE HCL 10 MG PO TABS: 10.0000 mg | ORAL_TABLET | Freq: Three times a day (TID) | ORAL | 0 refills | Status: DC | PRN

## 2020-05-18 MED ORDER — IBUPROFEN 600 MG PO TABS: 600.0000 mg | ORAL_TABLET | Freq: Three times a day (TID) | ORAL | 0 refills | Status: DC | PRN

## 2020-05-18 MED ORDER — AMLODIPINE BESYLATE 5 MG PO TABS: 5.0000 mg | ORAL_TABLET | Freq: Every day | ORAL | 1 refills | Status: DC

## 2020-05-18 MED ORDER — ACETAMINOPHEN 325 MG PO TABS: 650.0000 mg | ORAL_TABLET | Freq: Four times a day (QID) | ORAL | 0 refills | Status: DC | PRN

## 2020-05-18 MED ORDER — MEDROXYPROGESTERONE ACETATE 150 MG/ML IM SUSP
150.0000 mg | Freq: Once | INTRAMUSCULAR | Status: AC
  Administered 2020-05-18: 150 mg via INTRAMUSCULAR
  Filled 2020-05-18: qty 1

## 2020-05-18 MED ORDER — COCONUT OIL OIL: 1.0000 "application " | TOPICAL_OIL | 0 refills | Status: AC | PRN

## 2020-05-18 MED FILL — IBUPROFEN 600 MG TABLET: 600 | 10 days supply | Qty: 30 | Fill #0

## 2020-05-18 MED FILL — CYCLOBENZAPRINE 10 MG TAB: 10 | 10 days supply | Qty: 30 | Fill #0

## 2020-05-18 MED FILL — ACETAMINOPHEN 325 MG TABS: 325 | 30 days supply | Qty: 30 | Fill #0

## 2020-05-18 MED FILL — AMLODIPINE BESYLATE 5 MG TA: 5 | 45 days supply | Qty: 45 | Fill #0

## 2020-05-18 NOTE — Progress Notes (Signed)
CSW notified that MOB requested meal vouchers and bus pass prior to discharge. CSW followed up with MOB via telephone. MOB reported that she wanted the bus pass to be able to come back tomorrow and visit with infants. CSW agreed to provide MOB's RN with bus pass before MOB's discharge. MOB shared that she was having difficulties viewing infants on NICVIEW camera. CSW agreed to provide contact information for technical support for cameras. CSW agreed to leave meal vouchers in infants room, MOB agreeable. MOB reported that she was doing good. CSW inquired about any additional needs/concerns. MOB expressed concerns about providing breast milk to infants, CSW encouraged MOB to follow up with medical team about her concerns. MOB denied any additional needs/concerns.   CSW provided unit secretary with 31 day bus pass and NICVIEW camera information to provide to RN to provide to MOB.   Celso Sickle, LCSW Clinical Social Worker [facility] Cell#: [phone]

## 2020-05-18 NOTE — Lactation Note (Signed)
This note was copied from a baby's chart. Lactation Consultation Note  Patient Name: Caroline Ingram TIWPY'[id number] Date: 05/18/2020 Reason for consult: Follow-up assessment;NICU baby Age:34 hours  LC to mother's room for f/u visit. Mom will d/c today. She has an appointment to pick up [facility] pump today. Mom pumped 4-5 times yesterday and is able to hand express. She will increase pumping to 8xday. Patient was provided with the opportunity to ask questions. All concerns were addressed.  Will plan follow up visit when mom is able to visit in NICU.   Consult Status Consult Status: Follow-up Follow-up type: In-patient   Elder Negus, MA IBCLC 05/18/2020, 9:58 AM

## 2020-05-18 NOTE — Discharge Instructions (Signed)

## 2020-05-19 LAB — SURGICAL PATHOLOGY

## 2020-05-21 ENCOUNTER — Telehealth: Payer: Self-pay | Admitting: *Deleted

## 2020-05-21 ENCOUNTER — Encounter: Payer: Medicaid Other | Admitting: Obstetrics and Gynecology

## 2020-05-21 DIAGNOSIS — M545 Low back pain, unspecified: Secondary | ICD-10-CM

## 2020-05-21 DIAGNOSIS — G8929 Other chronic pain: Secondary | ICD-10-CM

## 2020-05-21 MED ORDER — IBUPROFEN 600 MG PO TABS: 600.0000 mg | ORAL_TABLET | Freq: Four times a day (QID) | ORAL | 0 refills | Status: AC | PRN

## 2020-05-21 NOTE — Telephone Encounter (Signed)
Pt left VM message stating that she delivered pre-term twins on 1/29. Both babies are still in NICU and she visits most every [name] - traveling by bus to the hospital. She has been taking Tylenol and Motrin as prescribed for abdominal pain. These medications were helpful while in the hospital however due to the amount of physical activity she is now having, they are inadequate for her abdominal discomfort. Pt is requesting a stronger pain medication. Pt also requests a new referral for PT due to her back pain which is excruciating. Lastly, pt requests a call from [name] personnel [name]. Per consult with Dr. Shawnie Pons who delivered the twins, pt may try heating pad or heat wraps to her back and/or Voltaren gel. She also may find a Maternity support belt to be helpful. Referral order for PT was received. Pt will receive appt information once it has been scheduled. No additional pain medication will be prescribed.   1710  I called pt and advised her of the information as stated above. Pt became irate, was yelling and stated that she does not have money for OTC meds such as Voltaren gel or for heat wraps. She was crying and also was upset in thinking that Dr. Shawnie Pons may have thought she wanted narcotic medication which she states she did not want - she just wanted some stronger Ibuprofen or other medication to help with the pain. I advised pt that I can send in new Rx for Ibuprofen to be taken every 6 hours instead of every 8 hours. I also recommended that she alternate doses of Tylenol with Ibuprofen so that she is taking medication every 3 hours. New referral for PT appt has been sent and she will be contacted with appt information. Pt would like her [facility] appt from 1/31 which was cancelled due to her being in the hospital to be rescheduled. She is concerned about developing post partum depression.  I assured her that this will be done and she will receive notification via Mychart. I spent approximately 25 minutes  talking with pt and calming her down. By the end of the conversation, she stated that she felt better.

## 2020-05-22 ENCOUNTER — Ambulatory Visit: Payer: Medicaid Other

## 2020-05-22 ENCOUNTER — Ambulatory Visit: Payer: Self-pay

## 2020-05-25 ENCOUNTER — Ambulatory Visit: Payer: Medicaid Other

## 2020-05-25 NOTE — BH Specialist Note (Addendum)
Integrated Behavioral Health via Telemedicine Visit  05/25/2020 [name] [id number]  Number of Integrated Behavioral Health visits: 1 Session Start time: 9:21  Session End time: 9:55 Total time: 34  Referring Provider: Terri Skains Patient/Family location: Home [facility] Provider [facility]: [facility]  All persons participating in visit: [name] and Caroline Ingram   Types of Service: Telephone visit  I connected with Caroline Ingram and/or Caroline Ingram n/a by Telephone  (Video is Caregility application) and verified that I am speaking with the correct person using two identifiers.Discussed confidentiality: Yes   I discussed the limitations of telemedicine and the availability of in person appointments.  Discussed there is a possibility of technology failure and discussed alternative modes of communication if that failure occurs.  I discussed that engaging in this telemedicine visit, they consent to the provision of behavioral healthcare and the services will be billed under their insurance.  Patient and/or legal guardian expressed understanding and consented to Telemedicine visit: Yes   Presenting Concerns: Patient and/or family reports the following symptoms/concerns: Pt states her primary concern today is stress, worry, sadness, and profound loss over being separated from her children, due to unfounded CPS case, along with grieving the recent loss of two beloved aunts and worry over her mother, who has gone missing for almost two months.  Pt's goals are to be reunited with her children and to "make sure my kids are happy"; strives for a better life for her children. Pt is coping with additional stress by smoking cigarettes and sleeping.  Duration of problem: Increase postpartum; Severity of problem: severe  Patient and/or Family's Strengths/Protective Factors: Sense of purpose  Goals  Addressed: Patient will: 1.  Reduce symptoms of: anxiety, depression and stress  2.  Increase knowledge and/or ability of: healthy habits  3.  Demonstrate ability to: Increase healthy adjustment to current life circumstances, Increase adequate support systems for patient/family and Increase motivation to adhere to plan of care  Progress towards Goals: Ongoing  Interventions: Interventions utilized:  Solution-Focused Strategies and Psychoeducation and/or Health Education Standardized Assessments completed: GAD-7 and PHQ 9  Patient and/or Family Response: Pt agrees to treatment plan   Assessment: Patient currently experiencing Grief, Psychosocial stress; also Bipolar 2 disorder and PTSD (borth as previously diagnosed by psychiatry)   Patient may benefit from psychoeducation and brief therapeutic interventions regarding coping with symptoms of depression, anxiety and stress related to recent grief and loss   Plan: 1. Follow up with behavioral health clinician on : Two weeks 2. Behavioral recommendations:  -Continue taking prenatal vitamin daily, as recommended by medical provider (low iron may be contributing to feelings of depression and fatigue) -Continue taking Zoloft as prescribed  -Accept referral to [facility] for psychiatry and ongoing therapy -Allow feelings of grief (separation from children, loss of aunts); consider additional grief support, as needed ([facility] grief support listed on After Visit Summary) -Continue prioritizing healthy sleep nightly; sleep when tired as self-care during this time -Read educational materials regarding coping with symptoms of anxiety with panic (on After Visit Summary)  3. Referral(s): Integrated Art gallery manager (In Clinic) and [facility] (LME/Outside Clinic)  I discussed the assessment and treatment plan with the patient and/or parent/guardian. They were provided an opportunity to ask questions and all were  answered. They agreed with the plan and demonstrated an understanding of the instructions.   They were advised to call back or seek an in-person  evaluation if the symptoms worsen or if the condition fails to improve as anticipated.  Caroline Lips, LCSW   Depression screen [facility] 2/9 06/08/2020 06/02/2020 04/23/2020 04/22/2020 04/22/2020  Decreased Interest 3 3 2 2 2   Down, Depressed, Hopeless 3 3 1 1 1   PHQ - 2 Score 6 6 3 3 3   Altered sleeping 0 3 2 2 2   Tired, decreased energy 3 3 3 3 3   Change in appetite 1 3 3 3 3   Feeling bad or failure about yourself  3 3 1 1 1   Trouble concentrating 0 3 1 1 1   Moving slowly or fidgety/restless 0 0 0 0 0  Suicidal thoughts 0 0 0 0 0  PHQ-9 Score 13 21 13 13 13    GAD 7 : Generalized Anxiety Score 06/08/2020 04/23/2020 04/22/2020 04/22/2020  Nervous, Anxious, on Edge 3 2 2 2   Control/stop worrying 3 3 3 3   Worry too much - different things 3 3 3 3   Trouble relaxing 3 2 2 2   Restless 0 2 2 2   Easily annoyed or irritable 1 3 3 3   Afraid - awful might happen 3 2 2 2   Total GAD 7 Score 16 17 17  17

## 2020-05-29 ENCOUNTER — Ambulatory Visit: Payer: Medicaid Other

## 2020-05-29 ENCOUNTER — Other Ambulatory Visit: Payer: Self-pay

## 2020-05-29 ENCOUNTER — Ambulatory Visit: Payer: Self-pay

## 2020-06-02 ENCOUNTER — Other Ambulatory Visit: Payer: Self-pay

## 2020-06-02 ENCOUNTER — Ambulatory Visit (HOSPITAL_COMMUNITY)
Admission: EM | Admit: 2020-06-02 | Discharge: 2020-06-02 | Disposition: A | Discharge status: Home / Self Care | Payer: Medicaid Other | Attending: Nurse Practitioner | Admitting: Nurse Practitioner

## 2020-06-02 DIAGNOSIS — F53 Postpartum depression: Secondary | ICD-10-CM

## 2020-06-02 DIAGNOSIS — O0990 Supervision of high risk pregnancy, unspecified, unspecified trimester: Secondary | ICD-10-CM

## 2020-06-02 DIAGNOSIS — O99345 Other mental disorders complicating the puerperium: Secondary | ICD-10-CM | POA: Insufficient documentation

## 2020-06-02 MED ORDER — SERTRALINE HCL 100 MG PO TABS: 200.0000 mg | ORAL_TABLET | Freq: Every day | ORAL | 0 refills | Status: DC
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
## 2020-06-02 NOTE — Discharge Instructions (Signed)

## 2020-06-02 NOTE — ED Provider Notes (Signed)
Behavioral Health Urgent Care Medical Screening Exam  Patient Name: Caroline Ingram MRN: [id number] Date of Evaluation: 06/02/20 Chief Complaint:   Diagnosis:  Final diagnoses:  Post partum depression    History of Present illness: Caroline Ingram is a 35 y.o. female who presents voluntarily to [facility] for an evaluation due to depression.  Patient reports that she gave birth to twin boys 2 weeks ago who were born 2 months prematurely.  She states that they are currently in the NICU.  She states that she is only allowed to have supervised visits at the NICU due to marijuana being found in the cord blood of her sons.  Patient reports that she had a CPS case this past year after she accidentally dropped her son, who was a few months old at the time.  She reports that her fianc has given her a hard time since that incident.  She states that he constantly brings it up and makes negative comments.  She states that today she threw a glass bowl and drinking glass on the floor and broke them.  She states that sometimes when she is stressed or angry she breaks glass.  She denies throwing the glass at others.  She states that after she throws glass she cleans it up.  She denies breaking the glass to harm herself.  She reports that she is taking Zoloft 150 mg daily, which is prescribed by her OB GYN.  She states that she was previously taking Zoloft 100 mg twice a day as prescribed by [employer].  She states that her OB/GYN took over and decreased it to 150 mg daily.  She feels like she needs to increase to 200 mg daily.   On evaluation, she is alert and oriented x4.  She is pleasant and cooperative.  She reports her mood is anxious and depressed.  Affect is congruent with mood.  Her speech is clear and coherent.  Thought content is logical.  She denies homicidal ideations.  She denies suicidal ideations.  She denies auditory and visual hallucinations.  No indication that she is responding to internal  stimuli.  Patient reports that she is interested in services provided at [facility].  She states that she has a video therapy appointment scheduled for February 21 through [facility].  Discussed open access hours available at [facility].   Patient states that she is feeling better now that she was able to talk about her situation.  She states that she feels safe going home.  She states that her sister lives nearby and she is going to go stay the night with her sister.   Psychiatric Specialty Exam  Presentation  General Appearance:Appropriate for Environment; Well Groomed  Eye Contact:Good  Speech:Clear and Coherent; Normal Rate  Speech Volume:Normal  Handedness:Right   Mood and Affect  Mood:Anxious; Depressed  Affect:Congruent; Depressed   Thought Process  Thought Processes:Coherent  Descriptions of Associations:Intact  Orientation:Full (Time, Place and Person)  Thought Content:WDL  Hallucinations:None  Ideas of Reference:None  Suicidal Thoughts:No  Homicidal Thoughts:No   Sensorium  Memory:Immediate Good; Recent Good; Remote Good  Judgment:Fair  Insight:Fair   Executive Functions  Concentration:Good  Attention Span:Good  Recall:Good  Fund of Knowledge:Good  Language:Good   Psychomotor Activity  Psychomotor Activity:Normal   Assets  Assets:Communication Skills; Desire for Improvement; Financial Resources/Insurance; Housing; Physical Health; Social Support   Sleep  Sleep:Fair  Number of hours: No data recorded  Physical Exam: Physical Exam Constitutional:      General: She is  not in acute distress.    Appearance: She is not ill-appearing, toxic-appearing or diaphoretic.  HENT:     Head: Normocephalic.     Right Ear: External ear normal.     Left Ear: External ear normal.  Eyes:     Conjunctiva/sclera: Conjunctivae normal.     Pupils: Pupils are equal, round, and reactive to light.  Cardiovascular:     Rate and Rhythm: Normal rate.   Pulmonary:     Effort: Pulmonary effort is normal. No respiratory distress.  Musculoskeletal:        General: Normal range of motion.  Skin:    General: Skin is warm and dry.  Neurological:     Mental Status: She is alert and oriented to person, place, and time.  Psychiatric:        Mood and Affect: Mood is anxious and depressed.        Thought Content: Thought content is not paranoid or delusional. Thought content does not include homicidal or suicidal ideation.    Review of Systems  Constitutional: Negative for chills, diaphoresis, fever, malaise/fatigue and weight loss.  HENT: Negative for congestion.   Respiratory: Negative for cough and shortness of breath.   Cardiovascular: Negative for chest pain and palpitations.  Gastrointestinal: Negative for diarrhea, nausea and vomiting.  Neurological: Negative for dizziness and seizures.  Psychiatric/Behavioral: Positive for depression. Negative for hallucinations, memory loss and suicidal ideas. The patient is nervous/anxious.   All other systems reviewed and are negative.  Blood pressure 133/87, pulse 75, temperature 98.4 F (36.9 C), temperature source Tympanic, resp. rate 18, height 5\' 4"  (1.626 m), weight 160 lb (72.6 kg), SpO2 100 %, unknown if currently breastfeeding. Body mass index is 27.46 kg/m.  Musculoskeletal: Strength & Muscle Tone: within normal limits Gait & Station: normal Patient leans: N/A  Demographic Factors:  Low socioeconomic status  Loss Factors: Financial problems/change in socioeconomic status  Historical Factors: Family history of mental illness or substance abuse  Risk Reduction Factors:   Responsible for children under 2 years of age, Sense of responsibility to family, Religious beliefs about death and Living with another person, especially a relative  Continued Clinical Symptoms:  Depression  Cognitive Features That Contribute To Risk:  None    Suicide Risk:  Minimal: No identifiable  suicidal ideation.  Patients presenting with no risk factors but with morbid ruminations; may be classified as minimal risk based on the severity of the depressive symptoms   [facility] MSE Discharge Disposition for Follow up and Recommendations: Based on my evaluation the patient does not appear to have an emergency medical condition and can be discharged with resources and follow up care in outpatient services for Medication Management and Individual Therapy   Increase sertraline to 200 mg daily for depression   15, NP 06/02/2020, 9:31 PM

## 2020-06-08 ENCOUNTER — Ambulatory Visit (INDEPENDENT_AMBULATORY_CARE_PROVIDER_SITE_OTHER): Payer: Medicaid Other | Admitting: Clinical

## 2020-06-08 DIAGNOSIS — F3181 Bipolar II disorder: Secondary | ICD-10-CM | POA: Diagnosis not present

## 2020-06-08 DIAGNOSIS — F4310 Post-traumatic stress disorder, unspecified: Secondary | ICD-10-CM | POA: Insufficient documentation

## 2020-06-08 DIAGNOSIS — Z658 Other specified problems related to psychosocial circumstances: Secondary | ICD-10-CM

## 2020-06-08 DIAGNOSIS — F4321 Adjustment disorder with depressed mood: Secondary | ICD-10-CM | POA: Insufficient documentation

## 2020-06-08 DIAGNOSIS — F17200 Nicotine dependence, unspecified, uncomplicated: Secondary | ICD-10-CM

## 2020-06-08 NOTE — Addendum Note (Signed)
Addended by: Hulda Marin C on: 06/08/2020 12:42 PM   Modules accepted: Orders

## 2020-06-08 NOTE — Patient Instructions (Addendum)
Center for [facility] at [facility] [address] [place], [place] [address] [phone] (main office) [phone] (Patience Nuzzo's office)  Hello [place], These are resources that have been helpful to women and families experiencing grief:   [facility] (Individual and group grief support; also have "Kid's Path" for children experiencing grief)  [facility].Gerre Scull  [phone]   Behavioral Health Resources:   What if I or someone I know is in crisis?  . If you are thinking about harming yourself or having thoughts of suicide, or if you know someone who is, seek help right away.  . Call your doctor or mental health care provider.  . Call 911 or go to a hospital emergency room to get immediate help, or ask a friend or family member to help you do these things; IF YOU ARE IN [place], YOU MAY GO TO WALK-IN URGENT CARE 24/7 at [facility] (see below)  . Call the [place] National Suicide Prevention Lifeline's toll-free, 24-hour hotline at 1-800-273-TALK 7072901229) or TTY: 1-800-799-4 TTY 8034860959) to talk to a trained counselor.  . If you are in crisis, make sure you are not left alone.   . If someone else is in crisis, make sure he or she is not left alone   24 Hour :   [facility]  [address] [place], [place] [address] [phone] or [phone] WALK-IN URGENT CARE 24/7  [facility] Mobile Crisis: [phone]  [place] National Suicide Hotline: 240-218-8525  [facility] the [employer] (Domestic Violence, Rape & Victim Assistance)  (732)103-7649  [facility]  [address], [place]  [address]   [phone] or [phone]   [facility] [employer] [facility]: [phone] (8am-4pm) or [phone] (after hours)        [facility], [address] [place], [place]   [phone] Fax: [phone] [email or web address] *Interpreters available *Accepts all insurance and uninsured for Urgent Care needs *Accepts Medicaid and uninsured for outpatient treatment   [facility]   Mon-Fri: 8am-5pm [address], [place], [place] [phone](QQPYP); [phone](fax) [email or web address]  *Accepts Medicare  [facility] [place], Fri: 8am-4pm [address], [place], [place] [address] [phone] (phone); [phone] (fax) [email or web address]  *Accepts Medicare  [facility] Mon-Fri: 9am-5pm  [address] [place], [place] [phone] (phone); [phone]  [email or web address]  *Accepts Medicaid  [facility]ovita Kussmaul Total Access [facility] [address] [place], [place]  [phone] [email or web address]   [facility] [place], 8:30am-12pm/1pm-2:30pm [address] [place], [place] [phone] (phone); [phone] (fax) [email or web address]  *Accepts Medicaid, sliding-scale*Bilingual services available  [facility] Mon-Fri, 8am-7pm [address] [place], [place]  [phone](HYIFO); [phone](fax) [email or web address].[email or web address]  *Accepts Medicaid *Bilingual services available  [facility] Mon-Fri: 8am-5pm, Saturday by appointment only [address] [place], [place], [place] [phone] (phone); [phone] (fax) [email or web address]   [facility] [address], [place], [place] [phone] [email or web address]  *Free & reduced services for uninsured and underinsured individuals *Bilingual services for Spanish-speaking clients 21 and under  [facility], [address], [place], [place] [phone](YFVCB); [phone](fax) [email or web address]  *Bring your own  interpreter at first visit *Accepts Medicare and [facility]  [facility] Mon-Fri: 9am-5:30pm [address], [place], [place] [phone] (phone), [phone] (fax) After hours crisis line: [phone] [email or web address]  *Accepts Medicare and Medicaid  [facility] Mon-Thurs, 8am-6pm [phone] [place]  [address] [place], [place]  [phone] (phone); [phone] (fax) [email or web address]  *Subsidized costs available  [facility] Mon-Fri: 8am-4pm [address], [place], [place] [phone](WNUUV); [phone](fax) [email or web address]  *Accepts Medicaid  [facility] Same day access hours: Mon-Fri, 8:30-3pm Crisis hours: Mon-Fri, 8am-5pm [address] [place], [place] [phone]  [facility] Same day access hours: Mon-Fri, 8:30-3pm Crisis hours: Mon-Fri, 8am-8pm [address] [place], [place] [phone] (phone); [phone] (fax) [email or web address].[email or web address]  *Accepts Medicaid and Medicare  [facility] [place], [place], Fri: 9am-9pm Tues, Thurs: 9am-6pm [address] [place], [place], [place]  [phone] (phone); [phone] (fax) [email or web address]  *(Accepts Medicare and Medicaid; payment plans available)*Bilingual services available  [facility] [address], [place], [place] [phone] (phone); [phone] (fax) [email or web address]   [facility] [address], [place], [place]  [phone]  [email or web address]  *Bilingual services available  [facility]) Mon-Thurs: 8am-8pm [address], [place], [place] [phone] (phone); [phone] (fax) [email or web address]  *Accepts Medicaid*Bilingual services available  [facility] [address] [place], [place] [phone]  (phone) [email or web address]  *Accepts Medicaid *Bilingual services available  [facility] Mon-Fri, 9am-4:45pm [address] [place], [place] [phone] (phone); [phone] (fax) [email or web address]  *Accepts Medicare  [facility] Mon-Thurs: 8:30-8pm, Fri: 8:30am-7pm [address] [place], [place] (3rd floor) [phone] (phone); [phone] (fax) [email or web address]  *Accepts Medicaid; income-based reduced rates available  [facility] Mon-Fri: 8am-5pm [address], [place], [place] [address] [phone] (phone); [phone] (fax) [email or web address]  *Accepts Medicaid*Bilingual services available   [facility] [place])  [address], [place] [phone] www.[email or web address]  *Provides direct services to individuals in recovery from mental illness, including support groups, recovery skills classes, and one on one peer support  [employer] on Mental Illness) [place] helpline: (386) 685-9269  [facility] [place] helpline: 408-858-2858 https://[email or web address]  *A community hub for information relating to local resources and services for the friends and families of individuals living alongside a mental health condition, as well as the individuals themselves. Classes and support groups also provided   Coping with Panic Attacks   What is a panic attack?  You may have had a panic attack if you experienced four or more of the symptoms listed below coming on abruptly and peaking in about 10 minutes.  Panic Symptoms   . Pounding heart  . Sweating  . Trembling or shaking  . Shortness of breath  . Feeling of choking  . Chest pain  . Nausea or abdominal distress    . Feeling dizzy, unsteady, lightheaded, or faint  . Feelings of unreality or being detached from yourself  . Fear of losing control or going crazy  . Fear of dying  .  Numbness or tingling  . Chills or hot flashes      Panic attacks are sometimes accompanied by avoidance of certain places or situations. These are often situations that would be difficult to escape from or in which help might not be available. Examples might include crowded shopping malls, public transportation, restaurants, or driving.   Why do panic attacks occur?   Panic attacks are the body's alarm system gone awry. All of [place] have a built-in alarm system, powered by adrenaline, which increases our heart rate, breathing, and blood flow in response to danger. Ordinarily, this 'danger response system' works well. In some people, however, the response is either out of proportion to whatever stress is going on, or may come out of the blue  without any stress at all.   For example, if you are walking in the woods and see a bear coming your way, a variety of changes occur in your body to prepare you to either fight the danger or flee from the situation. Your heart rate will increase to get more blood flow around your body, your breathing rate will quicken so that more oxygen is available, and your muscles will tighten in order to be ready to fight or run. You may feel nauseated as blood flow leaves your stomach area and moves into your limbs. These bodily changes are all essential to helping you survive the dangerous situation. After the danger has passed, your body functions will begin to go back to normal. This is because your body also has a system for "recovering" by bringing your body back down to a normal state when the danger is over.   As you can see, the emergency response system is adaptive when there is, in fact, a "true" or "real" danger (e.g., bear). However, sometimes people find that their emergency response system is triggered in "everyday" situations where there really is no true physical danger (e.g., in a meeting, in the grocery store, while driving in normal traffic, etc.).   What  triggers a panic attack?  Sometimes particularly stressful situations can trigger a panic attack. For example, an argument with your spouse or stressors at work can cause a stress response (activating the emergency response system) because you perceive it as threatening or overwhelming, even if there is no direct risk to your survival.  Sometimes panic attacks don't seem to be triggered by anything in particular- they may "come out of the blue". Somehow, the natural "fight or flight" emergency response system has gotten activated when there is no real danger. Why does the body go into "emergency mode" when there is no real danger?   Often, people with panic attacks are frightened or alarmed by the physical sensations of the emergency response system. First, unexpected physical sensations are experienced (tightness in your chest or some shortness of breath). This then leads to feeling fearful or alarmed by these symptoms ("Something's wrong!", "Am I having a heart attack?", "Am I going to faint?") The mind perceives that there is a danger even though no real danger exists. This, in turn, activates the emergency response system ("fight or flight"), leading to a "full blown" panic attack. In summary, panic attacks occur when we misinterpret physical symptoms as signs of impending death, craziness, loss of control, embarrassment, or fear of fear. Sometimes you may be aware of thoughts of danger that activate the emergency response system (for example, thinking "I'm having a heart attack" when you feel chest pressure or increased heart rate). At other times, however, you may not be aware of such thoughts. After several incidences of being afraid of physical sensations, anxiety and panic can occur in response to the initial sensations without conscious thoughts of danger. Instead, you just feel afraid or alarmed. In other words, the panic or fear may seem to occur "automatically" without you consciously telling  yourself anything.   After having had one or more panic attacks, you may also become more focused on what is going on inside your body. You may scan your body and be more vigilant about noticing any symptoms that might signal the start of a panic attack. This makes it easier for panic attacks to happen again because you pick up on sensations you might otherwise not have noticed, and  misinterpret them as something dangerous. A panic attack may then result.      How do I cope with panic attacks?  An important part of overcoming panic attacks involves re-interpreting your body's physical reactions and teaching yourself ways to decrease the physical arousal. This can be done through practicing the cognitive and behavioral interventions below.   Research has found that over half of people who have panic attacks show some signs of hyperventilation or overbreathing. This can produce initial sensations that alarm you and lead to a panic attack. Overbreathing can also develop as part of the panic attack and make the symptoms worse. When people hyperventilate, certain blood vessels in the body become narrower. In particular, the brain may get slightly less oxygen. This can lead to the symptoms of dizziness, confusion, and lightheadedness that often occur during panic attacks. Other parts of the body may also get a bit less oxygen, which may lead to numbness or tingling in the hands or feet or the sensation of cold, clammy hands. It also may lead the heart to pump harder. Although these symptoms may be frightening and feel unpleasant, it is important to remember that hyperventilating is not dangerous. However, you can help overcome the unpleasantness of overbreathing by practicing Breathing Retraining.   Practice this basic technique three times a day, every day:  . Inhale. With your shoulders relaxed, inhale as slowly and deeply as you can while you count to six. If you can, use your diaphragm to fill your lungs  with air.  . Hold. Keep the air in your lungs as you slowly count to four.  . Exhale. Slowly breath out as you count to six.  . Repeat. Do the inhale-hold-exhale cycle several times. Each time you do it, exhale for longer counts.  Like any new skill, Breathing Retraining requires practice. Try practicing this skill twice a day for several minutes. Initially, do not try this technique in specific situations or when you become frightened or have a panic attack. Begin by practicing in a quiet environment to build up your skill level so that you can later use it in time of "emergency."   2. Decreasing Avoidance  Regardless of whether you can identify why you began having panic attacks or whether they seemed to come out of the blue, the places where you began having panic attacks often can become triggers themselves. It is not uncommon for individuals to begin to avoid the places where they have had panic attacks. Over time, the individual may begin to avoid more and more places, thereby decreasing their activities and often negatively impacting their quality of life. To break the cycle of avoidance, it is important to first identify the places or situations that are being avoided, and then to do some "relearning."  To begin this intervention, first create a list of locations or situations that you tend to avoid. Then choose an avoided location or situation that you would like to target first. Now develop an "exposure hierarchy" for this situation or location. An "exposure hierarchy" is a list of actions that make you feel anxious in this situation. Order these actions from least to most anxiety-producing. It is often helpful to have the first item on your hierarchy involve thinking or imagining part of the feared/avoided situation.   Here is an example of an exposure hierarchy for decreasing avoidance of the grocery store. Note how it is ordered from the least amount of anxiety (at the top) to the most  anxiety (  at the bottom):  Marland Kitchen Think about going to the grocery store alone.  . Go to the grocery store with a friend or family member.  . Go to the grocery store alone to pick up a few small items (5-10 minutes in the store).  . Shopping for 10-20 minutes in the store alone.  . Doing the shopping for the week by myself (20-30 minutes in the store).   Your homework is to "expose" yourself to the lowest item on your hierarchy and use your breathing relaxation and coping statements (see below) to help you remain in the situation. Practice this several times during the upcoming week. Once you have mastered each item with minimal anxiety, move on to the next higher action on your list.   Cognitive Interventions  1. Identify your negative self-talk Anxious thoughts can increase anxiety symptoms and panic. The first step in changing anxious thinking is to identify your own negative, alarming self-talk. Some common alarming thoughts:  . I'm having a heart attack.            . I must be going crazy. . I think I'm dying. Marland Kitchen People will think I'm crazy. . I'm going to pass our.  . Oh no- here it comes.  . I can't stand this.  Peggye Form got to get out of here!  2. Use positive coping statements Changing or disrupting a pattern of anxious thoughts by replacing them with more calming or supportive statements can help to divert a panic attack. Some common helpful coping statements:  . This is not an emergency.  . I don't like feeling this way, but I can accept it.  . I can feel like this and still be okay.  . This has happened before, and I was okay. I'll be okay this time, too.  . I can be anxious and still deal with this situation.

## 2020-06-09 NOTE — BH Specialist Note (Addendum)
Integrated Behavioral Health via Telemedicine Visit  06/09/2020 MOZELLE REMLINGER [id number]  Number of Integrated Behavioral Health visits: 2 Session Start time: 8:17  Session End time: 8:43 Total time: 26  Referring Provider: Merian Capron, MD Patient/Family location: Home [facility] Provider location: [facility]  All persons participating in visit: [name] and [facility] Chiante Peden   Types of Service: Individual psychotherapy and Telephone visit  I connected with Daleysa Janeece Agee and/or [name] n/a by Telephone  (Video is Caregility application) and verified that I am speaking with the correct person using two identifiers.Discussed confidentiality: Yes   I discussed the limitations of telemedicine and the availability of in person appointments.  Discussed there is a possibility of technology failure and discussed alternative modes of communication if that failure occurs.  I discussed that engaging in this telemedicine visit, they consent to the provision of behavioral healthcare and the services will be billed under their insurance.  Patient and/or legal guardian expressed understanding and consented to Telemedicine visit: Yes   Presenting Concerns: Patient and/or family reports the following symptoms/concerns: Pt states her primary concern today is stress over unable to see her babies due to CPS case. Pt says CPS needs a letter from a medical provider saying she is safe to be around her children, and after assessment at [facility], pt says that they don't do paperwork like that, so pt is unsure exactly who is able to complete this paperwork she needs to meet CPS requirement. Pt is neither suicidal, nor homicidal; pt has not heard from [facility] outpatient to schedule initial appointment to establish care for ongoing therapy and psychiatry for medication management.  Duration of problem: Postpartum; Severity of problem:  moderately severe  Patient and/or Family's Strengths/Protective Factors: Concrete supports in place (healthy food, safe environments, etc.) and Sense of purpose  Goals Addressed: Patient will: 1.  Reduce symptoms of: stress  2.  Demonstrate ability to: Increase healthy adjustment to current life circumstances  Progress towards Goals: Ongoing  Interventions: Interventions utilized:  Solution-Focused Strategies Standardized Assessments completed: Not Needed  Patient and/or Family Response: Pt agrees to treatment plan  Assessment: Patient currently experiencing Psychosocial stress and Grief  Patient may benefit from continued psychoeducation and brief therapeutic interventions regarding coping with symptoms of current life stress .  Plan: 1. Follow up with behavioral health clinician on : Call Franca Stakes at [phone] as needed 2. Behavioral recommendations:  -Call behavioral health clinician, Asher Muir, at [phone], with information about paperwork that CPS is requiring (who needs to write the letter; what does CPS specifically need to know) -Continue taking BH medication as prescribed -Continue using healthy self-coping strategies to manage current stress -Continue healthy grieving over loss of aunts 3. Referral(s): Integrated [facility] (In Clinic)  I discussed the assessment and treatment plan with the patient and/or parent/guardian. They were provided an opportunity to ask questions and all were answered. They agreed with the plan and demonstrated an understanding of the instructions.   They were advised to call back or seek an in-person evaluation if the symptoms worsen or if the condition fails to improve as anticipated.  Rae Lips, LCSW   Depression screen [facility] 2/9 06/08/2020 06/02/2020 04/23/2020 04/22/2020 04/22/2020  Decreased Interest 3 3 2 2 2   Down, Depressed, Hopeless 3 3 1 1 1   PHQ - 2 Score 6 6 3 3 3   Altered sleeping 0 3 2 2 2   Tired, decreased  energy  3 3 3 3 3   Change in appetite 1 3 3 3 3   Feeling bad or failure about yourself  3 3 1 1 1   Trouble concentrating 0 3 1 1 1   Moving slowly or fidgety/restless 0 0 0 0 0  Suicidal thoughts 0 0 0 0 0  PHQ-9 Score 13 21 13 13 13    GAD 7 : Generalized Anxiety Score 06/08/2020 04/23/2020 04/22/2020 04/22/2020  Nervous, Anxious, on Edge 3 2 2 2   Control/stop worrying 3 3 3 3   Worry too much - different things 3 3 3 3   Trouble relaxing 3 2 2 2   Restless 0 2 2 2   Easily annoyed or irritable 1 3 3 3   Afraid - awful might happen 3 2 2 2   Total GAD 7 Score 16 17 17  17

## 2020-06-10 ENCOUNTER — Ambulatory Visit: Payer: Self-pay

## 2020-06-10 NOTE — Lactation Note (Signed)
This note was copied from a baby's chart. Lactation Consultation Note  Patient Name: Caroline Ingram FXJOI'T Date: 06/10/2020   Age:34 wk.o.   Per RN, mother is no longer pumping and supplying milk for babies. LC services are complete.   Elder Negus, MA IBCLC 06/10/2020, 10:45 AM

## 2020-06-22 ENCOUNTER — Ambulatory Visit: Payer: Medicaid Other | Admitting: Obstetrics and Gynecology

## 2020-06-23 ENCOUNTER — Ambulatory Visit (INDEPENDENT_AMBULATORY_CARE_PROVIDER_SITE_OTHER): Payer: Medicaid Other | Admitting: Clinical

## 2020-06-23 DIAGNOSIS — F4321 Adjustment disorder with depressed mood: Secondary | ICD-10-CM | POA: Diagnosis not present

## 2020-06-23 DIAGNOSIS — Z658 Other specified problems related to psychosocial circumstances: Secondary | ICD-10-CM

## 2020-06-30 ENCOUNTER — Telehealth: Payer: Self-pay | Admitting: Clinical

## 2020-06-30 NOTE — Telephone Encounter (Signed)
Follow up call to pt regarding referral to [facility]. Pt agrees to have follow up instructions from ED sent in a MyChart message, as patient was uncertain about her next step. Pt says she will go to [facility] during open access hours either Wednesday or Thursday morning this week, though transportation may be difficult prior to 8am.   [facility] Why: Open access hours Monday to Thursday 8 am to 11 am, arrive before 8 am Specialty: Urgent Care Contact: [address] [place] [address] [phone]

## 2020-07-01 ENCOUNTER — Ambulatory Visit: Payer: Medicaid Other | Admitting: Certified Nurse Midwife

## 2020-07-06 ENCOUNTER — Ambulatory Visit: Payer: Medicaid Other | Admitting: Nurse Practitioner

## 2020-07-08 ENCOUNTER — Ambulatory Visit: Payer: Medicaid Other | Admitting: Certified Nurse Midwife

## 2020-07-22 ENCOUNTER — Ambulatory Visit (INDEPENDENT_AMBULATORY_CARE_PROVIDER_SITE_OTHER): Payer: Medicaid Other | Admitting: Clinical

## 2020-07-22 ENCOUNTER — Other Ambulatory Visit: Payer: Self-pay

## 2020-07-22 DIAGNOSIS — F331 Major depressive disorder, recurrent, moderate: Secondary | ICD-10-CM

## 2020-07-22 DIAGNOSIS — F1210 Cannabis abuse, uncomplicated: Secondary | ICD-10-CM

## 2020-07-23 ENCOUNTER — Ambulatory Visit: Payer: Medicaid Other | Admitting: Physical Therapy

## 2020-07-23 ENCOUNTER — Ambulatory Visit: Payer: Medicaid Other | Attending: Family Medicine | Admitting: Physical Therapy

## 2020-07-23 DIAGNOSIS — M6281 Muscle weakness (generalized): Secondary | ICD-10-CM | POA: Insufficient documentation
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
## 2020-07-29 ENCOUNTER — Telehealth (HOSPITAL_COMMUNITY): Payer: Self-pay

## 2020-07-29 NOTE — Telephone Encounter (Signed)
Caroline Ingram, [facility] DSS, requesting visit note dos 07/22/20 from counselor. HIM Auth on file. Marylene Land will contact Therapist LCSW directly.

## 2020-07-30 ENCOUNTER — Ambulatory Visit (INDEPENDENT_AMBULATORY_CARE_PROVIDER_SITE_OTHER): Payer: Medicaid Other | Admitting: Clinical

## 2020-07-30 DIAGNOSIS — F430 Acute stress reaction: Secondary | ICD-10-CM
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
## 2020-07-31 DIAGNOSIS — F331 Major depressive disorder, recurrent, moderate: Secondary | ICD-10-CM | POA: Insufficient documentation
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
## 2020-08-06 ENCOUNTER — Other Ambulatory Visit (HOSPITAL_COMMUNITY)
Admission: RE | Admit: 2020-08-06 | Discharge: 2020-08-06 | Disposition: A | Discharge status: Home / Self Care | Payer: Medicaid Other | Source: Ambulatory Visit | Attending: Family Medicine | Admitting: Family Medicine

## 2020-08-06 ENCOUNTER — Other Ambulatory Visit: Payer: Self-pay | Admitting: Student

## 2020-08-06 ENCOUNTER — Ambulatory Visit (HOSPITAL_COMMUNITY): Payer: Medicaid Other | Admitting: Behavioral Health

## 2020-08-06 DIAGNOSIS — Z113 Encounter for screening for infections with a predominantly sexual mode of transmission: Secondary | ICD-10-CM

## 2020-08-07 LAB — HIV ANTIBODY (ROUTINE TESTING W REFLEX): HIV Screen 4th Generation wRfx: NONREACTIVE

## 2020-08-10 ENCOUNTER — Telehealth (HOSPITAL_COMMUNITY): Payer: Self-pay | Admitting: Behavioral Health

## 2020-08-13 ENCOUNTER — Ambulatory Visit (HOSPITAL_COMMUNITY): Payer: Medicaid Other | Admitting: Behavioral Health

## 2020-08-17 ENCOUNTER — Telehealth (HOSPITAL_COMMUNITY): Payer: Self-pay | Admitting: Behavioral Health

## 2020-08-17 NOTE — Telephone Encounter (Signed)
Patient asking to speak with you. Pt upset crying and request call as soon as possible, [phone].

## 2020-08-18 ENCOUNTER — Encounter (HOSPITAL_COMMUNITY): Payer: Self-pay

## 2020-08-18 ENCOUNTER — Emergency Department (HOSPITAL_COMMUNITY)
Admission: EM | Admit: 2020-08-18 | Discharge: 2020-08-18 | Disposition: A | Discharge status: Home / Self Care | Payer: Medicaid Other | Attending: Emergency Medicine | Admitting: Emergency Medicine

## 2020-08-18 ENCOUNTER — Emergency Department (HOSPITAL_COMMUNITY): Payer: Medicaid Other

## 2020-08-18 ENCOUNTER — Other Ambulatory Visit: Payer: Self-pay

## 2020-08-18 DIAGNOSIS — Z8616 Personal history of COVID-19: Secondary | ICD-10-CM | POA: Insufficient documentation

## 2020-08-18 DIAGNOSIS — R0602 Shortness of breath: Secondary | ICD-10-CM | POA: Diagnosis present

## 2020-08-18 DIAGNOSIS — Z20822 Contact with and (suspected) exposure to covid-19: Secondary | ICD-10-CM | POA: Insufficient documentation

## 2020-08-18 DIAGNOSIS — J45901 Unspecified asthma with (acute) exacerbation: Secondary | ICD-10-CM | POA: Diagnosis not present

## 2020-08-18 DIAGNOSIS — Z79899 Other long term (current) drug therapy: Secondary | ICD-10-CM | POA: Diagnosis not present

## 2020-08-18 DIAGNOSIS — F17200 Nicotine dependence, unspecified, uncomplicated: Secondary | ICD-10-CM | POA: Insufficient documentation

## 2020-08-18 DIAGNOSIS — I10 Essential (primary) hypertension: Secondary | ICD-10-CM | POA: Diagnosis not present

## 2020-08-18 LAB — RESP PANEL BY RT-PCR (FLU A&B, COVID) ARPGX2
Influenza A by PCR: NEGATIVE
Influenza B by PCR: NEGATIVE
SARS Coronavirus 2 by RT PCR: NEGATIVE

## 2020-08-18 LAB — I-STAT BETA HCG BLOOD, ED (MC, WL, AP ONLY): I-stat hCG, quantitative: <5 m[IU]/mL (ref <5)

## 2020-08-18 MED ORDER — PREDNISONE 20 MG PO TABS
60.0000 mg | ORAL_TABLET | Freq: Once | ORAL | Status: AC
  Administered 2020-08-18: 60 mg via ORAL
  Filled 2020-08-18: qty 3

## 2020-08-18 MED ORDER — ALBUTEROL SULFATE HFA 108 (90 BASE) MCG/ACT IN AERS: 2.0000 | INHALATION_SPRAY | RESPIRATORY_TRACT | 0 refills | Status: DC | PRN

## 2020-08-18 MED ORDER — IPRATROPIUM-ALBUTEROL 0.5-2.5 (3) MG/3ML IN SOLN
5.0000 mL | Freq: Once | RESPIRATORY_TRACT | Status: AC
  Administered 2020-08-18: 5 mL via RESPIRATORY_TRACT
  Filled 2020-08-18 (×2): qty 3

## 2020-08-18 MED ORDER — PREDNISONE 20 MG PO TABS: 40.0000 mg | ORAL_TABLET | Freq: Every day | ORAL | 0 refills | Status: DC

## 2020-08-18 NOTE — Progress Notes (Signed)
Nebulizer pending Covid results.

## 2020-08-18 NOTE — ED Notes (Signed)
Patient transported back from CT 

## 2020-08-18 NOTE — ED Notes (Signed)
Patient transported to CT via stretcher in stable condition 
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
## 2020-08-18 NOTE — ED Provider Notes (Signed)
MSE was initiated and I personally evaluated the patient and placed orders (if any) at  12:44 AM on Aug 18, 2020.  Patient here with chief complaint of shortness of breath.  She states that this feels like a bad asthma exacerbation.  She has been wheezing.  She states that the amount of coughing she has been doing this caused her to have a sore stomach muscles.  She has tried using her inhaler with little relief.  She denies any fever.  She states that she has had COVID in the past.  Alert and oriented x4 No respiratory distress Mild end expiratory wheezes  Albuterol and prednisone ordered.   Discussed with patient that their care has been initiated.   They are counseled that they will need to remain in the ED until the completion of their workup, including full H&P and results of any tests.  Risks of leaving the emergency department prior to completion of treatment were discussed. Patient was advised to inform ED staff if they are leaving before their treatment is complete. The patient acknowledged these risks and time was allowed for questions.    The patient appears stable so that the remainder of the MSE may be completed by another provider.    Roxy Horseman, PA-C 08/18/20 0045    Glynn Octave, MD 08/18/20 [phone]

## 2020-08-18 NOTE — ED Triage Notes (Signed)
Pt c/o cough, shortness of breath, abdominal pain x 1 week. Pt denies fever, states she has been around others that were sick as well. Pt states her asthma is acting up, states she feels like she is having trouble breathing and when she lays down the wheezing gets worse.

## 2020-08-24 ENCOUNTER — Other Ambulatory Visit: Payer: Self-pay

## 2020-08-24 ENCOUNTER — Ambulatory Visit (HOSPITAL_COMMUNITY): Payer: Medicaid Other | Admitting: Behavioral Health

## 2020-09-07 ENCOUNTER — Ambulatory Visit (HOSPITAL_COMMUNITY): Payer: Medicaid Other | Admitting: Behavioral Health

## 2020-09-11 ENCOUNTER — Other Ambulatory Visit: Payer: Self-pay

## 2020-09-11 ENCOUNTER — Telehealth (INDEPENDENT_AMBULATORY_CARE_PROVIDER_SITE_OTHER): Payer: Medicaid Other | Admitting: Psychiatry

## 2020-09-11 ENCOUNTER — Encounter (HOSPITAL_COMMUNITY): Payer: Self-pay | Admitting: Psychiatry

## 2020-09-11 DIAGNOSIS — F4310 Post-traumatic stress disorder, unspecified: Secondary | ICD-10-CM

## 2020-09-11 DIAGNOSIS — F3181 Bipolar II disorder: Secondary | ICD-10-CM | POA: Diagnosis not present

## 2020-09-11 DIAGNOSIS — F900 Attention-deficit hyperactivity disorder, predominantly inattentive type: Secondary | ICD-10-CM | POA: Diagnosis not present

## 2020-09-11 DIAGNOSIS — F17200 Nicotine dependence, unspecified, uncomplicated: Secondary | ICD-10-CM

## 2020-09-11 MED ORDER — ARIPIPRAZOLE ER 400 MG IM PRSY
400.0000 mg | PREFILLED_SYRINGE | Freq: Once | INTRAMUSCULAR | Status: AC
  Administered 2020-12-22: 400 mg via INTRAMUSCULAR

## 2020-09-11 MED ORDER — ARIPIPRAZOLE ER 400 MG IM PRSY: 400.0000 mg | PREFILLED_SYRINGE | INTRAMUSCULAR | 12 refills | Status: DC

## 2020-09-11 MED ORDER — SERTRALINE HCL 100 MG PO TABS: 200.0000 mg | ORAL_TABLET | Freq: Every day | ORAL | 0 refills | Status: DC

## 2020-09-11 MED ORDER — TRAZODONE HCL 50 MG PO TABS
50.0000 mg | ORAL_TABLET | Freq: Every evening | ORAL | 2 refills | Status: DC | PRN
Start: 2020-09-11 — End: 2020-12-14

## 2020-09-11 MED ORDER — ATOMOXETINE HCL 40 MG PO CAPS: 40.0000 mg | ORAL_CAPSULE | Freq: Every day | ORAL | 2 refills | Status: DC

## 2020-09-11 MED ORDER — ARIPIPRAZOLE (SENSOR) 10 MG PO TABS: 10.0000 mg | ORAL_TABLET | Freq: Every day | ORAL | 0 refills | Status: DC
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
## 2020-09-16 ENCOUNTER — Ambulatory Visit (HOSPITAL_COMMUNITY): Payer: Medicaid Other | Admitting: Behavioral Health

## 2020-09-17 ENCOUNTER — Ambulatory Visit: Payer: Medicaid Other | Attending: Family Medicine | Admitting: Physical Therapy

## 2020-09-21 ENCOUNTER — Ambulatory Visit (HOSPITAL_COMMUNITY): Payer: Medicaid Other | Admitting: Behavioral Health

## 2020-10-20 ENCOUNTER — Other Ambulatory Visit (HOSPITAL_COMMUNITY): Payer: Self-pay | Admitting: Psychiatry

## 2020-10-20 DIAGNOSIS — F3181 Bipolar II disorder: Secondary | ICD-10-CM

## 2020-10-27 ENCOUNTER — Other Ambulatory Visit: Payer: Self-pay

## 2020-10-27 ENCOUNTER — Ambulatory Visit (INDEPENDENT_AMBULATORY_CARE_PROVIDER_SITE_OTHER): Payer: Medicaid Other | Admitting: Psychiatry

## 2020-10-27 ENCOUNTER — Encounter (HOSPITAL_COMMUNITY): Payer: Self-pay | Admitting: Psychiatry

## 2020-10-27 ENCOUNTER — Ambulatory Visit (HOSPITAL_COMMUNITY): Payer: Medicaid Other | Admitting: Licensed Clinical Social Worker

## 2020-10-27 VITALS — BP 117/79 | HR 57 | Temp 98.5°F | Ht 64.0 in | Wt 174.0 lb

## 2020-10-27 DIAGNOSIS — Z634 Disappearance and death of family member: Secondary | ICD-10-CM

## 2020-10-27 DIAGNOSIS — F900 Attention-deficit hyperactivity disorder, predominantly inattentive type: Secondary | ICD-10-CM

## 2020-10-27 DIAGNOSIS — F3181 Bipolar II disorder: Secondary | ICD-10-CM
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
## 2020-10-28 ENCOUNTER — Ambulatory Visit (HOSPITAL_COMMUNITY): Payer: Medicaid Other

## 2020-10-29 ENCOUNTER — Ambulatory Visit (HOSPITAL_COMMUNITY): Payer: Medicaid Other
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
## 2020-12-07 ENCOUNTER — Telehealth (HOSPITAL_COMMUNITY): Payer: Medicaid Other | Admitting: Psychiatry

## 2020-12-11 ENCOUNTER — Telehealth (HOSPITAL_COMMUNITY): Payer: Medicaid Other | Admitting: Psychiatry

## 2020-12-11 ENCOUNTER — Other Ambulatory Visit: Payer: Self-pay

## 2020-12-14 ENCOUNTER — Other Ambulatory Visit (HOSPITAL_COMMUNITY): Payer: Self-pay | Admitting: Psychiatry

## 2020-12-14 DIAGNOSIS — F3181 Bipolar II disorder: Secondary | ICD-10-CM

## 2020-12-14 DIAGNOSIS — F900 Attention-deficit hyperactivity disorder, predominantly inattentive type: Secondary | ICD-10-CM

## 2020-12-17 ENCOUNTER — Telehealth (HOSPITAL_COMMUNITY): Payer: Self-pay | Admitting: *Deleted
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
## 2020-12-18 ENCOUNTER — Ambulatory Visit (HOSPITAL_COMMUNITY): Payer: Medicaid Other | Admitting: Licensed Clinical Social Worker

## 2020-12-22 ENCOUNTER — Ambulatory Visit (INDEPENDENT_AMBULATORY_CARE_PROVIDER_SITE_OTHER): Payer: Medicaid Other | Admitting: Family

## 2020-12-22 ENCOUNTER — Other Ambulatory Visit: Payer: Self-pay

## 2020-12-22 ENCOUNTER — Ambulatory Visit (HOSPITAL_COMMUNITY): Payer: Medicaid Other | Admitting: *Deleted

## 2020-12-22 ENCOUNTER — Encounter (HOSPITAL_COMMUNITY): Payer: Self-pay | Admitting: Family

## 2020-12-22 ENCOUNTER — Encounter (HOSPITAL_COMMUNITY): Payer: Self-pay

## 2020-12-22 VITALS — BP 154/98 | HR 79 | Ht 64.0 in | Wt 176.0 lb

## 2020-12-22 DIAGNOSIS — F331 Major depressive disorder, recurrent, moderate: Secondary | ICD-10-CM

## 2020-12-22 DIAGNOSIS — F3181 Bipolar II disorder: Secondary | ICD-10-CM

## 2020-12-22 DIAGNOSIS — F4310 Post-traumatic stress disorder, unspecified: Secondary | ICD-10-CM | POA: Diagnosis not present

## 2020-12-22 MED ORDER — SERTRALINE HCL 100 MG PO TABS: 200.0000 mg | ORAL_TABLET | Freq: Every day | ORAL | 0 refills | Status: DC

## 2020-12-22 MED ORDER — ARIPIPRAZOLE (SENSOR) 10 MG PO TABS: 10.0000 mg | ORAL_TABLET | Freq: Every day | ORAL | 0 refills | Status: DC

## 2020-12-22 MED ORDER — ARIPIPRAZOLE ER 400 MG IM PRSY: 400.0000 mg | PREFILLED_SYRINGE | INTRAMUSCULAR | 12 refills | Status: AC

## 2020-12-22 MED ORDER — HYDROXYZINE HCL 10 MG PO TABS: 10.0000 mg | ORAL_TABLET | Freq: Three times a day (TID) | ORAL | 0 refills | Status: DC | PRN
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
## 2020-12-28 ENCOUNTER — Other Ambulatory Visit: Payer: Self-pay

## 2020-12-28 ENCOUNTER — Ambulatory Visit (INDEPENDENT_AMBULATORY_CARE_PROVIDER_SITE_OTHER): Payer: Medicaid Other | Admitting: Licensed Clinical Social Worker

## 2020-12-28 DIAGNOSIS — F411 Generalized anxiety disorder: Secondary | ICD-10-CM
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
## 2020-12-31 ENCOUNTER — Ambulatory Visit (HOSPITAL_COMMUNITY): Payer: Medicaid Other | Admitting: Licensed Clinical Social Worker

## 2021-01-10 IMAGING — US OBSTETRIC <14 WK US AND TRANSVAGINAL OB US
1 series · 15 of 28 positions shown · non-contrast
Comparison: No recent prior.

CLINICAL DATA: Right-sided pain.

EXAM:
OBSTETRIC <14 WK US AND TRANSVAGINAL OB US
TECHNIQUE: Both transabdominal and transvaginal ultrasound examinations were
performed for complete evaluation of the gestation as well as the
maternal uterus, adnexal regions, and pelvic cul-de-sac.
Transvaginal technique was performed to assess early pregnancy.

[Series 1: obstetric <14 wk us and transvaginal ob us · 15 of 69 slices shown]
[im 1/69]
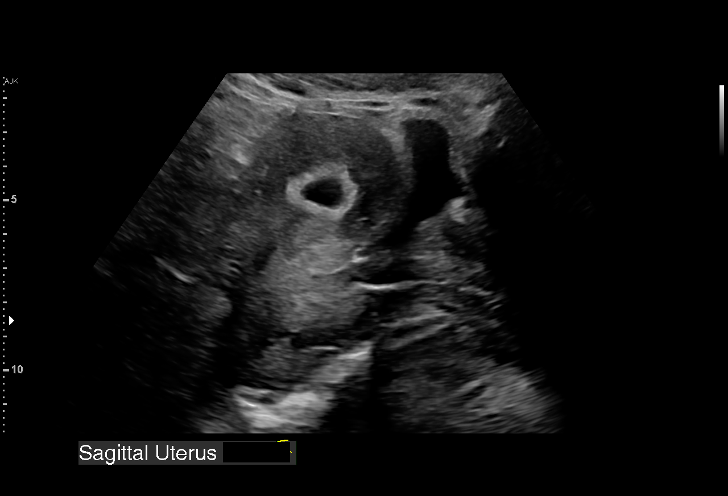
[im 6/69]
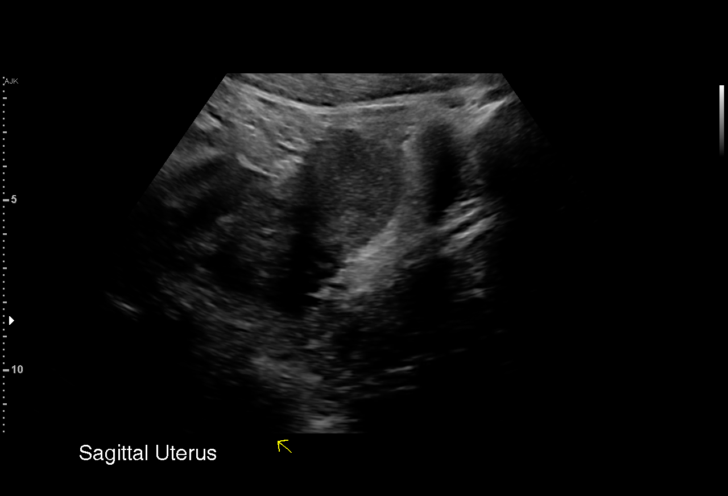
[im 11/69]
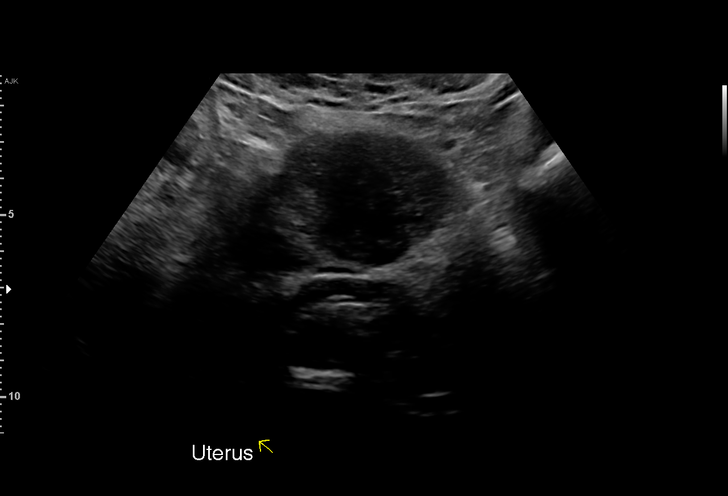
[im 16/69]
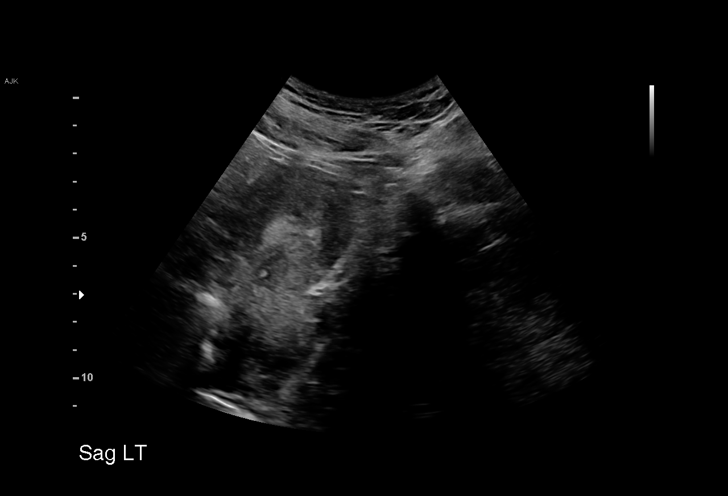
[im 21/69]
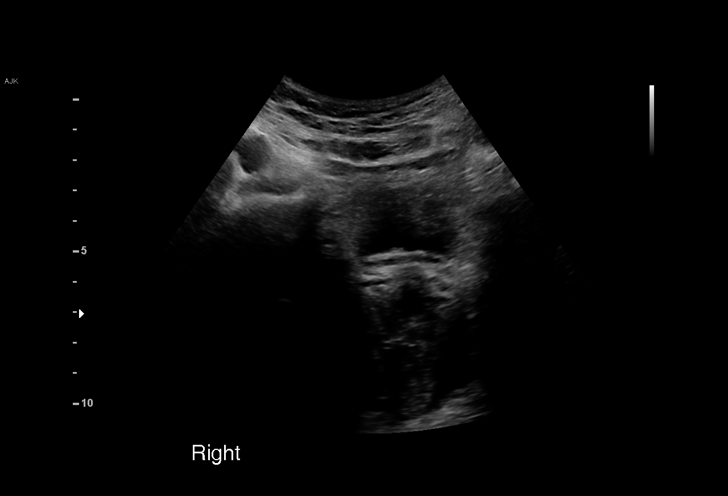
[im 26/69]
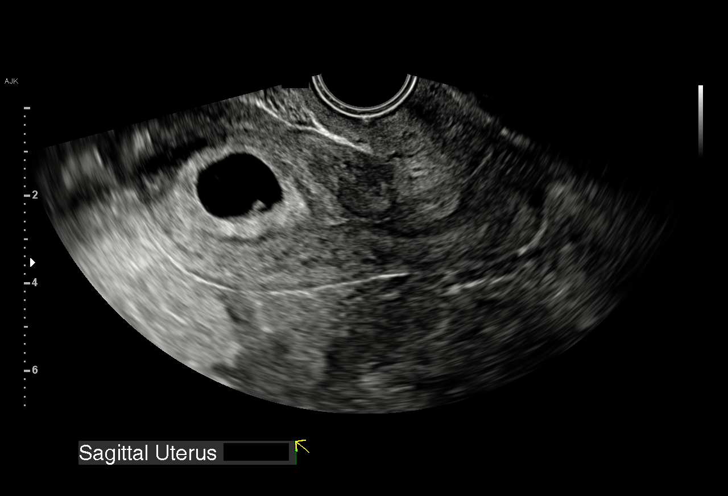
[im 31/69]
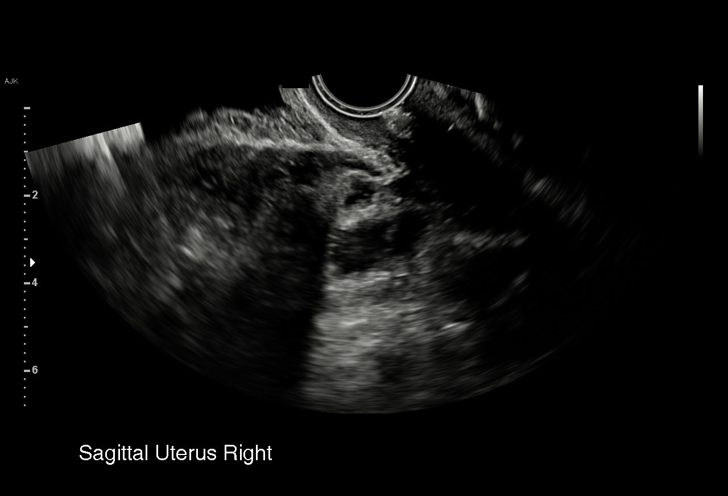
[im 36/69]
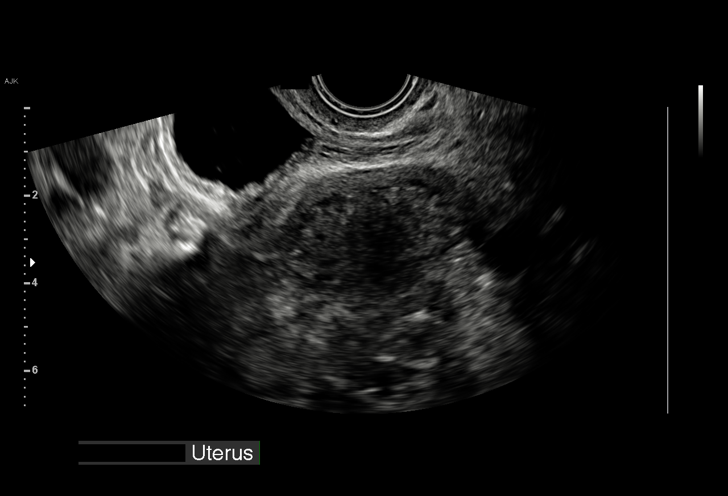
[im 38/69]
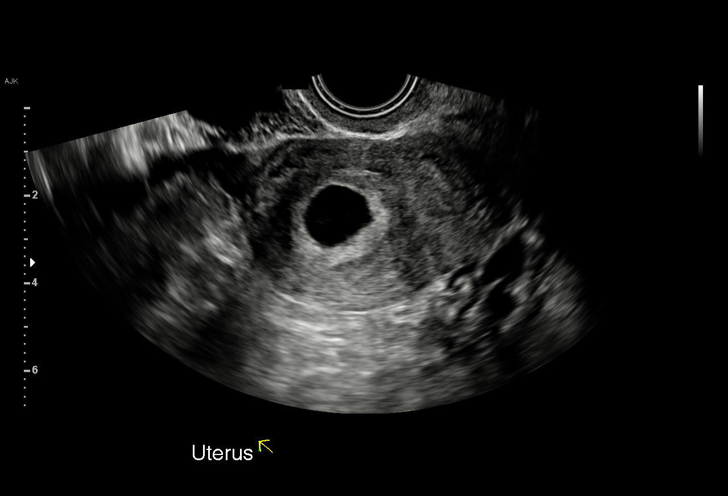
[im 43/69]
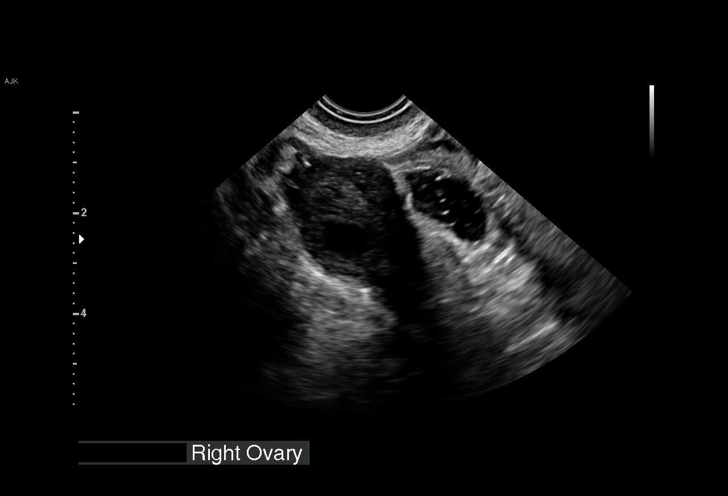
[im 48/69]
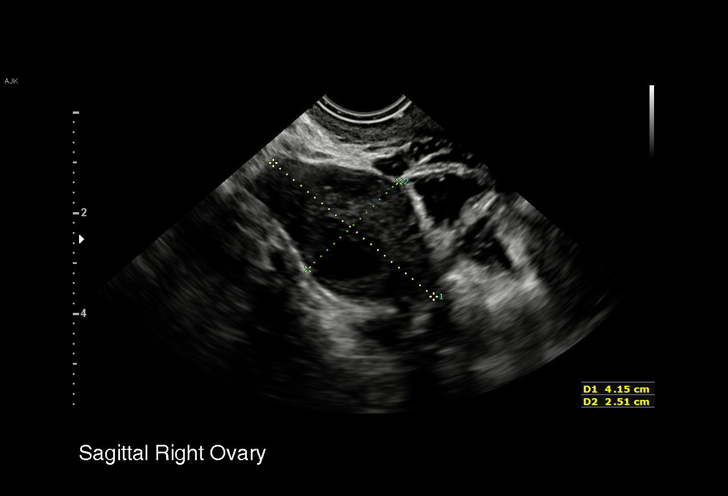
[im 53/69]
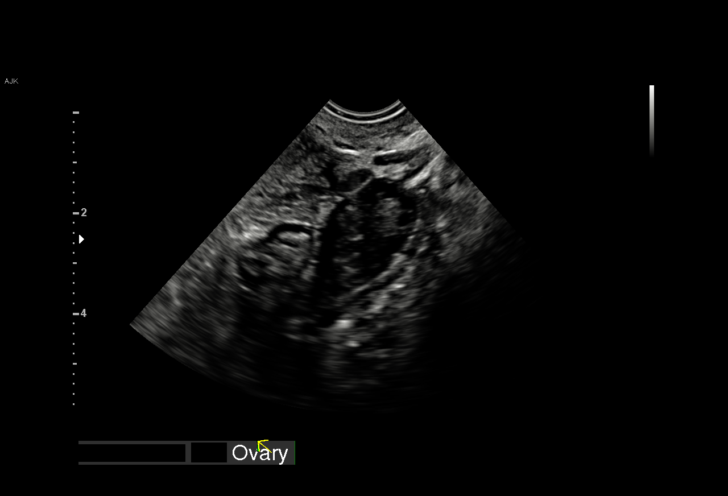
[im 58/69]
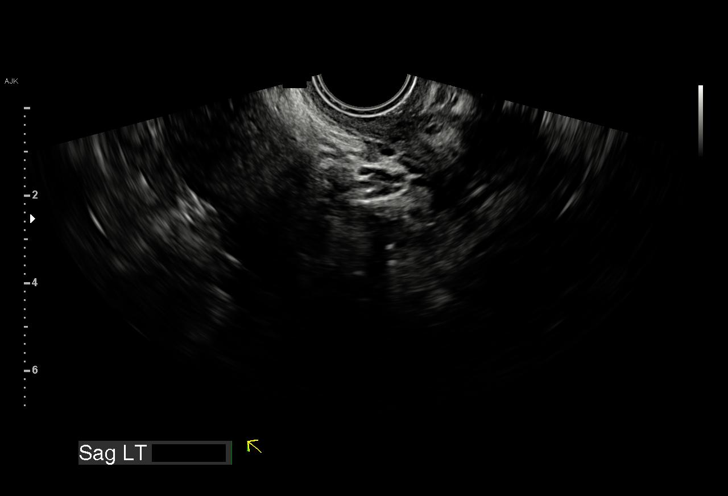
[im 63/69]
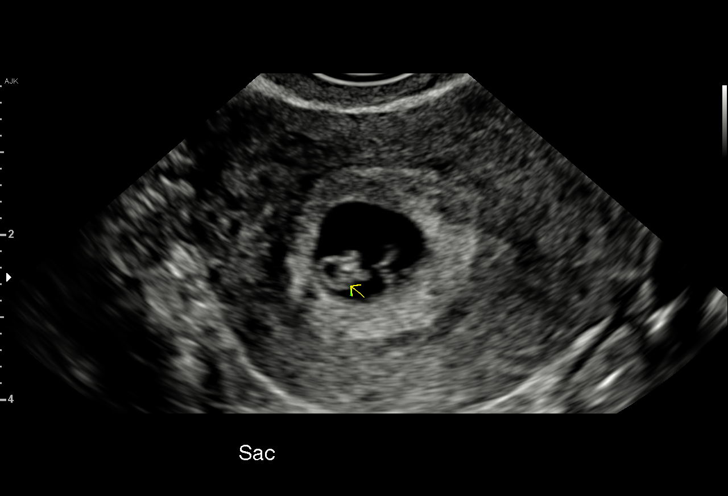
[im 69/69]
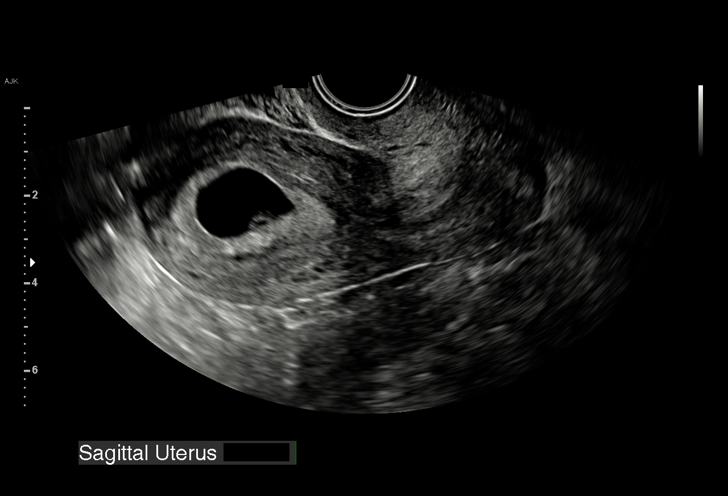

[15 of 28 positions shown; findings below may reference images not displayed]

FINDINGS: Intrauterine gestational sac: Single

Yolk sac:  Present

Embryo:  Present

Cardiac Activity: Present

Heart Rate: 133 bpm

CRL: 7.6 mm   6 w   for d                  US EDC: 05/13/2019

Subchorionic hemorrhage:  None visualized.

Maternal uterus/adnexae: Tiny right ovarian cyst noted most likely
corpus luteal cyst.
IMPRESSION: Single viable intrauterine pregnancy at 6 weeks 4 days.

## 2021-01-11 ENCOUNTER — Ambulatory Visit (HOSPITAL_COMMUNITY): Payer: Self-pay | Admitting: Licensed Clinical Social Worker

## 2021-01-16 ENCOUNTER — Encounter: Payer: Self-pay | Admitting: Radiology

## 2021-01-19 ENCOUNTER — Ambulatory Visit (HOSPITAL_COMMUNITY): Payer: Medicaid Other

## 2021-01-21 ENCOUNTER — Ambulatory Visit (HOSPITAL_COMMUNITY): Payer: Medicaid Other

## 2021-01-25 ENCOUNTER — Ambulatory Visit (HOSPITAL_COMMUNITY): Payer: Self-pay | Admitting: Licensed Clinical Social Worker

## 2021-03-01 ENCOUNTER — Telehealth (HOSPITAL_COMMUNITY): Payer: Self-pay | Admitting: Licensed Clinical Social Worker

## 2021-03-01 ENCOUNTER — Ambulatory Visit (HOSPITAL_COMMUNITY): Payer: Medicaid Other | Admitting: Licensed Clinical Social Worker

## 2021-03-01 NOTE — Telephone Encounter (Signed)
This clinician had to unexpectedly work virtually this date. Admin staff attempted to call pt to advise. Pt did not answer phone and staff unable to lvm. LCSW sent text message with link for virtual appt at scheduled time. LCSW remained avail online for appt until 9:15. Pt failed to sign on for session.

## 2021-03-21 ENCOUNTER — Emergency Department (HOSPITAL_COMMUNITY)
Admission: EM | Admit: 2021-03-21 | Discharge: 2021-03-21 | Disposition: A | Discharge status: Home / Self Care | Payer: Medicaid Other | Attending: Emergency Medicine | Admitting: Emergency Medicine

## 2021-03-21 ENCOUNTER — Encounter (HOSPITAL_COMMUNITY): Payer: Self-pay | Admitting: Emergency Medicine

## 2021-03-21 ENCOUNTER — Other Ambulatory Visit: Payer: Self-pay

## 2021-03-21 DIAGNOSIS — R1030 Lower abdominal pain, unspecified: Secondary | ICD-10-CM | POA: Insufficient documentation

## 2021-03-21 DIAGNOSIS — N939 Abnormal uterine and vaginal bleeding, unspecified: Secondary | ICD-10-CM | POA: Diagnosis not present

## 2021-03-21 DIAGNOSIS — Z5321 Procedure and treatment not carried out due to patient leaving prior to being seen by health care provider: Secondary | ICD-10-CM | POA: Insufficient documentation

## 2021-03-21 LAB — POC URINE PREG, ED: Preg Test, Ur: NEGATIVE

## 2021-03-21 NOTE — ED Triage Notes (Signed)
Pt reports vaginal pain since this morning that is worse when she tries to stand up.  Denies urinary complaints.  States it did feel like she was leaking fluid earlier but didn't see any discharge.

## 2021-03-21 NOTE — ED Provider Notes (Signed)
Emergency Medicine Provider Triage Evaluation Note  Caroline Ingram , a 35 y.o. female  was evaluated in triage.  Pt complains of lower abdominal pain onset this morning.  She notes she has associated sharp vaginal bleeding.  She has not tried medication for her symptoms.  She denies dysuria, hematuria, vaginal bleeding, vaginal discharge, dyspareunia.  She reports that she noted something came out of her vagina but it was not bloody this morning.  Her last menstrual period was mid November.  She is supposed to be on Depo-Provera however she missed her last injection.  Her most recent Depo-Provera injection was August 5.  She is sexually active with 1 female partner within the past 6 months and unprotected intercourse.  Her last sexual intercourse was 2 days ago.  Review of Systems  Positive: Lower abdominal pain Negative: Vaginal bleeding, vaginal discharge  Physical Exam  BP 125/66 (BP Location: Right Arm)   Pulse 62   Temp 98.1 F (36.7 C) (Oral)   Resp 18   SpO2 100%  Gen:   Awake, no distress   Resp:  Normal effort  MSK:   Moves extremities without difficulty  Other:  Mild lower abdominal tenderness to palpation  Medical Decision Making  Medically screening exam initiated at 3:42 PM.  Appropriate orders placed.  Caroline Ingram was informed that the remainder of the evaluation will be completed by another provider, this initial triage assessment does not replace that evaluation, and the importance of remaining in the ED until their evaluation is complete.    Lillian Ballester A, PA-C 03/21/21 1550    Gerhard Munch, MD 03/21/21 [phone]

## 2021-03-21 NOTE — ED Notes (Signed)
Pt came out of triage room and states she has somewhere to be and is unable to wait any longer.  States she will return later.

## 2021-03-29 IMAGING — US ULTRASOUND ABDOMEN COMPLETE
1 series · 15 of 25 positions shown · non-contrast
Comparison: None.

CLINICAL DATA: Abdominal pain

EXAM:
ABDOMEN ULTRASOUND COMPLETE

[Series 1: ultrasound abdomen complete · 15 of 91 slices shown]
[im 1/91]
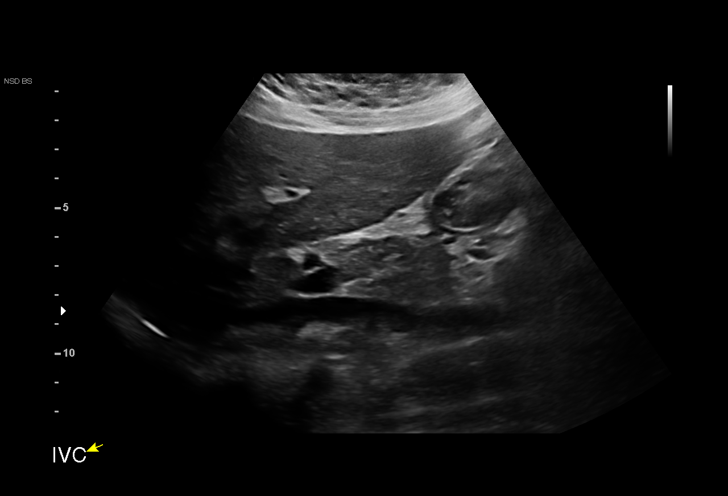
[im 8/91]
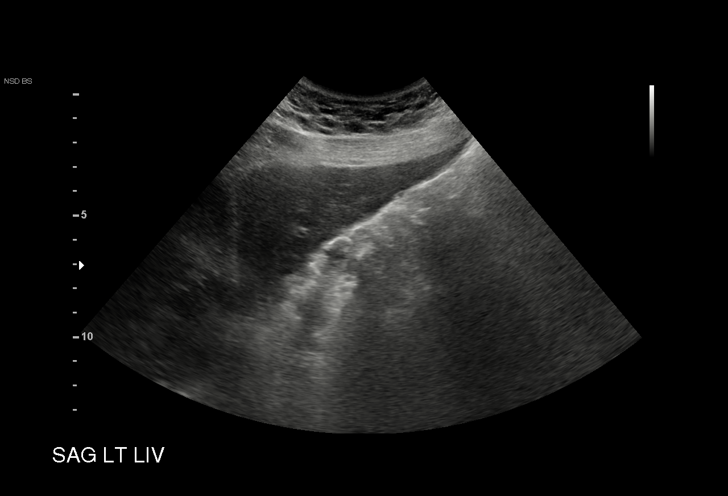
[im 16/91]
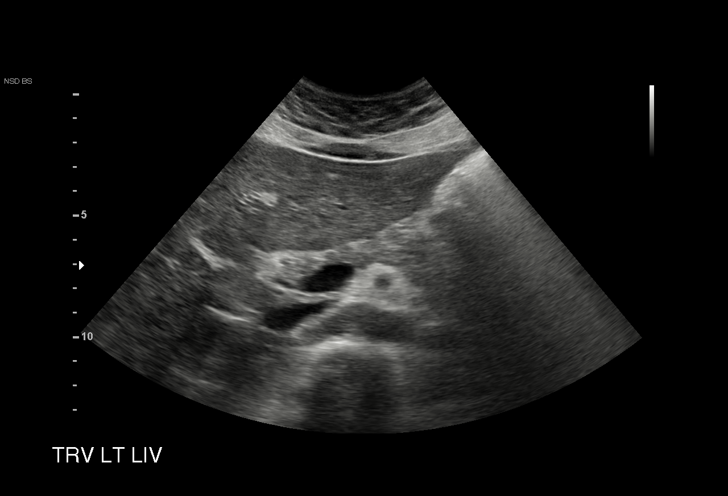
[im 19/91]
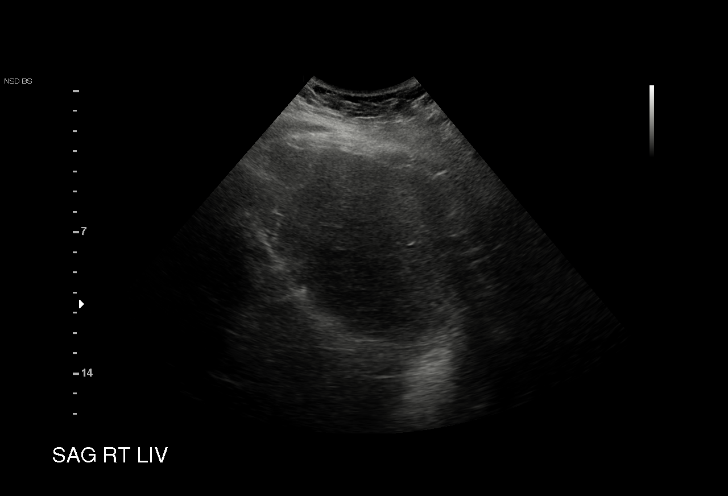
[im 27/91]
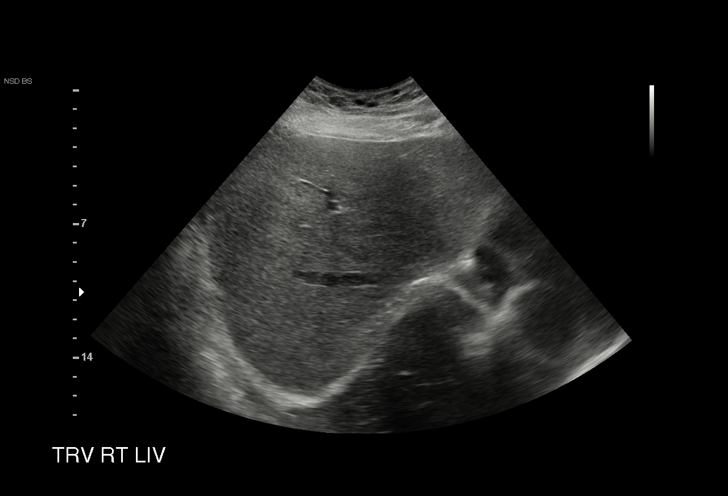
[im 34/91]
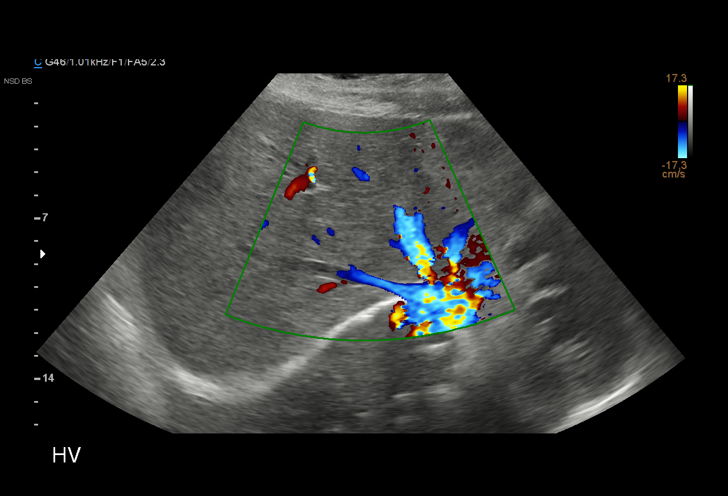
[im 38/91]
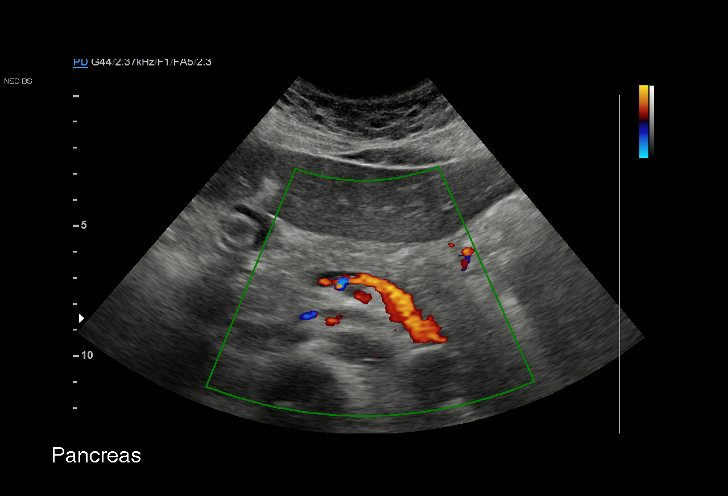
[im 46/91]
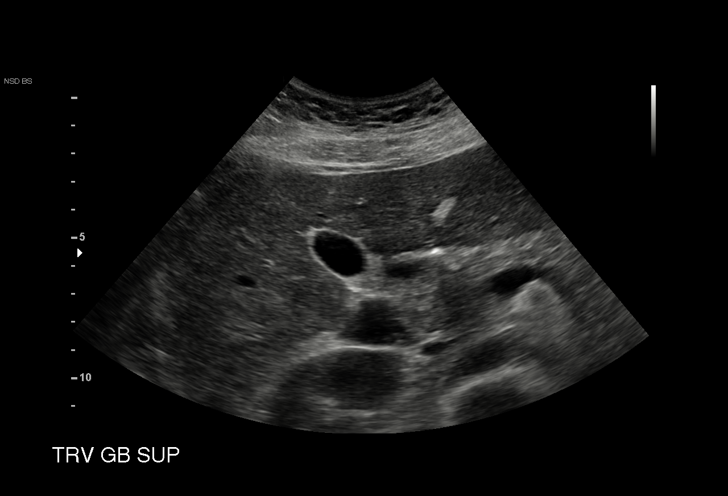
[im 53/91]
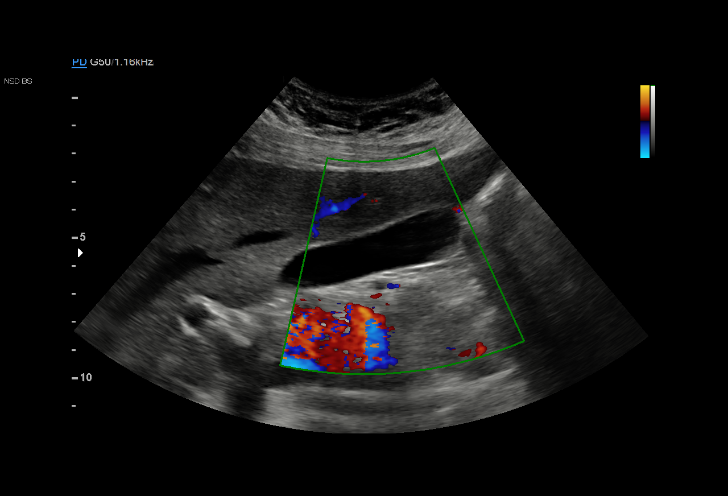
[im 57/91]
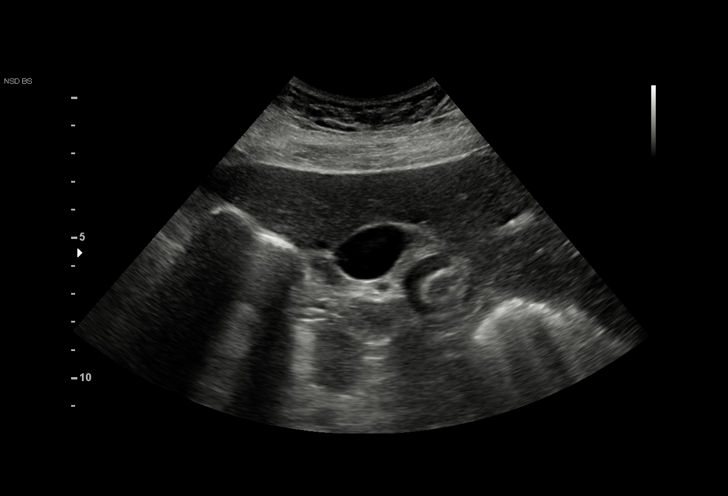
[im 64/91]
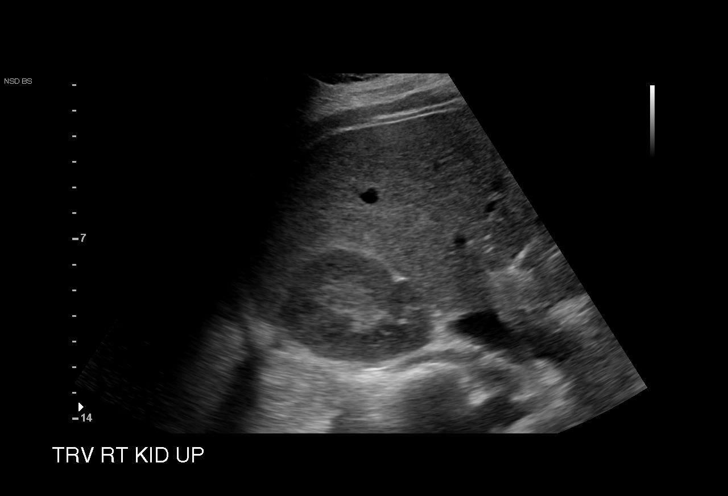
[im 72/91]
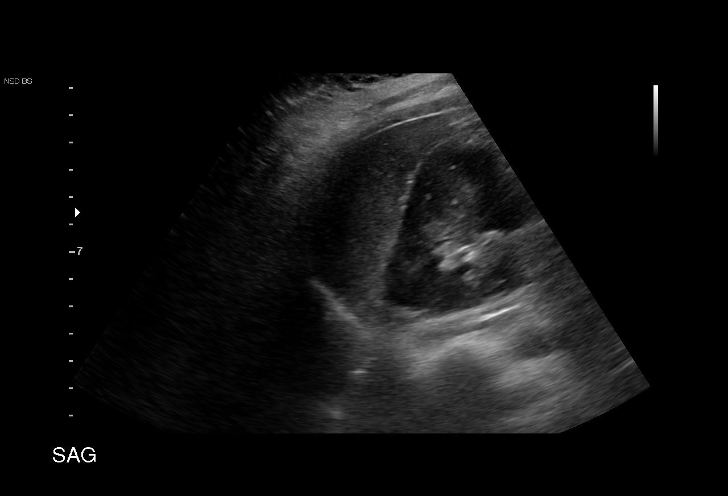
[im 76/91]
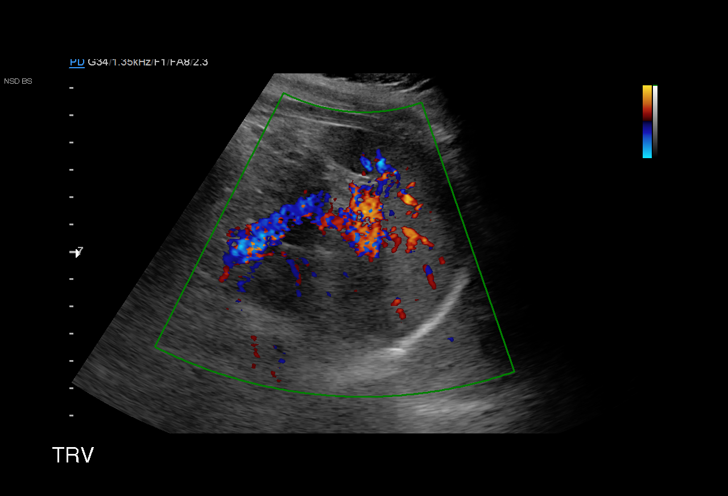
[im 83/91]
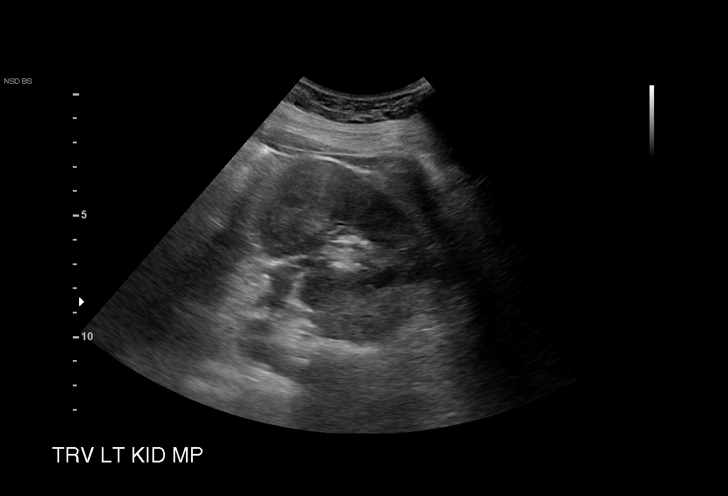
[im 91/91]
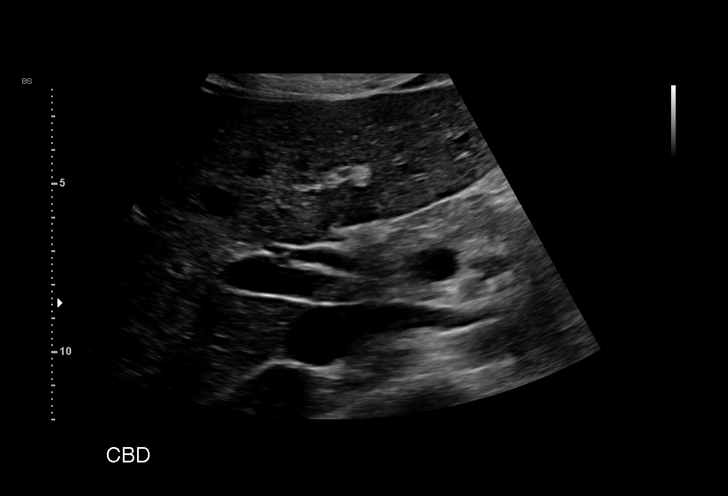

[15 of 25 positions shown; findings below may reference images not displayed]

FINDINGS: Gallbladder: There is a small amount of sludge without evidence for
gallbladder wall thickening or pericholecystic free fluid. The
sonographic Murphy sign is negative.

Common bile duct: Diameter: 2 mm

Liver: No focal lesion identified. Within normal limits in
parenchymal echogenicity. Portal vein is patent on color Doppler
imaging with normal direction of blood flow towards the liver.

IVC: No abnormality visualized.

Pancreas: Visualized portion unremarkable.

Spleen: Size and appearance within normal limits.

Right Kidney: Length: 11.4 cm. Echogenicity within normal limits. No
mass or hydronephrosis visualized.

Left Kidney: Length: 11.1 cm. Echogenicity within normal limits. No
mass or hydronephrosis visualized.

Abdominal aorta: No aneurysm visualized.

Other findings: None.
IMPRESSION: Normal study.

## 2022-02-21 ENCOUNTER — Ambulatory Visit: Payer: Self-pay | Admitting: Physician Assistant
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
## 2022-02-21 NOTE — Progress Notes (Deleted)
Virtual telephone visit    Virtual Visit via Telephone Note   This visit type was conducted due to national recommendations for restrictions regarding the COVID-19 Pandemic (e.g. social distancing) in an effort to limit this patient's exposure and mitigate transmission in our community. Due to her co-morbid illnesses, this patient is at least at moderate risk for complications without adequate follow up. This format is felt to be most appropriate for this patient at this time. The patient did not have access to video technology or had technical difficulties with video requiring transitioning to audio format only (telephone). Physical exam was limited to content and character of the telephone converstion.    Patient location: *** Provider location: ***  I discussed the limitations of evaluation and management by telemedicine and the availability of in person appointments. The patient expressed understanding and agreed to proceed.   Visit Date: 02/21/2022  Today's healthcare provider: Mikey Kirschner, PA-C   No chief complaint on file.  Subjective    HPI  ***    Medications: Outpatient Medications Prior to Visit  Medication Sig   albuterol (VENTOLIN HFA) 108 (90 Base) MCG/ACT inhaler Inhale 2 puffs into the lungs every 4 (four) hours as needed for wheezing or shortness of breath.   albuterol (VENTOLIN HFA) 108 (90 Base) MCG/ACT inhaler Inhale 2 puffs into the lungs every 4 (four) hours as needed for wheezing or shortness of breath.   amLODipine (NORVASC) 5 MG tablet TAKE 1 TABLET (5 MG TOTAL) BY MOUTH DAILY. (Patient not taking: Reported on 12/22/2020)   ARIPiprazole ER (ABILIFY MAINTENA) 400 MG PRSY prefilled syringe Inject 400 mg into the muscle every 28 (twenty-eight) days.   ARIPiprazole, sensor, 10 MG TABS Take 10 mg by mouth daily.   atomoxetine (STRATTERA) 40 MG capsule TAKE 1 CAPSULE (40 MG TOTAL) BY MOUTH DAILY. (Patient not taking: Reported on 12/22/2020)   coconut oil OIL  Apply 1 application topically as needed (nipple pain). (Patient not taking: Reported on 12/22/2020)   cyclobenzaprine (FLEXERIL) 5 MG tablet Take 5 mg by mouth 3 (three) times daily as needed for muscle spasms. (Patient not taking: Reported on 12/22/2020)   hydrOXYzine (ATARAX/VISTARIL) 10 MG tablet Take 1 tablet (10 mg total) by mouth 3 (three) times daily as needed.   ibuprofen (ADVIL) 600 MG tablet Take 1 tablet (600 mg total) by mouth every 6 (six) hours as needed for moderate pain or cramping. (Patient not taking: Reported on 12/22/2020)   mometasone-formoterol (DULERA) 100-5 MCG/ACT AERO Inhale 2 puffs into the lungs 2 (two) times daily as needed for wheezing or shortness of breath.   polyethylene glycol powder (GLYCOLAX/MIRALAX) 17 GM/SCOOP powder Take 17 g by mouth daily as needed. (Patient not taking: Reported on 12/22/2020)   predniSONE (DELTASONE) 20 MG tablet Take 2 tablets (40 mg total) by mouth daily. Take 40 mg by mouth daily for 3 days, then 20mg  by mouth daily for 3 days, then 10mg  daily for 3 days (Patient not taking: Reported on 12/22/2020)   Prenatal Vit-Fe Fumarate-FA (PRENATAL MULTIVITAMIN) TABS tablet Take 1 tablet by mouth daily at 12 noon. (Patient not taking: Reported on 12/22/2020)   Pseudoeph-Doxylamine-DM-APAP (NYQUIL PO) Take 1 Dose by mouth at bedtime as needed (cough/cold). (Patient not taking: Reported on 12/22/2020)   sertraline (ZOLOFT) 100 MG tablet Take 2 tablets (200 mg total) by mouth daily.   traZODone (DESYREL) 50 MG tablet TAKE 1 TABLET (50 MG TOTAL) BY MOUTH AT BEDTIME AS NEEDED FOR SLEEP. (Patient not taking:  Reported on 12/22/2020)   No facility-administered medications prior to visit.    Review of Systems  All other systems reviewed and are negative.   {Labs  Heme  Chem  Endocrine  Serology  Results Review (optional):23779}   Objective    There were no vitals taken for this visit. {Show previous vital signs (optional):23777}     Assessment & Plan      ***  No follow-ups on file.    I discussed the assessment and treatment plan with the patient. The patient was provided an opportunity to ask questions and all were answered. The patient agreed with the plan and demonstrated an understanding of the instructions.   The patient was advised to call back or seek an in-person evaluation if the symptoms worsen or if the condition fails to improve as anticipated.  I provided *** minutes of non-face-to-face time during this encounter.  {provider attestation***:1}  Mikey Kirschner, PA-C [facility] [phone] (phone) [phone] (fax)  [place]

## 2022-05-01 ENCOUNTER — Other Ambulatory Visit: Payer: Self-pay

## 2022-05-01 ENCOUNTER — Emergency Department: Payer: Medicaid Other

## 2022-05-01 ENCOUNTER — Emergency Department
Admission: EM | Admit: 2022-05-01 | Discharge: 2022-05-01 | Disposition: A | Discharge status: Home / Self Care | Payer: Medicaid Other | Attending: Emergency Medicine | Admitting: Emergency Medicine

## 2022-05-01 DIAGNOSIS — I10 Essential (primary) hypertension: Secondary | ICD-10-CM | POA: Diagnosis not present

## 2022-05-01 DIAGNOSIS — J45909 Unspecified asthma, uncomplicated: Secondary | ICD-10-CM | POA: Diagnosis not present

## 2022-05-01 DIAGNOSIS — R0789 Other chest pain: Secondary | ICD-10-CM

## 2022-05-01 DIAGNOSIS — Z760 Encounter for issue of repeat prescription: Secondary | ICD-10-CM | POA: Insufficient documentation

## 2022-05-01 DIAGNOSIS — F3181 Bipolar II disorder: Secondary | ICD-10-CM

## 2022-05-01 DIAGNOSIS — R0602 Shortness of breath: Secondary | ICD-10-CM | POA: Diagnosis present

## 2022-05-01 DIAGNOSIS — R079 Chest pain, unspecified: Secondary | ICD-10-CM | POA: Insufficient documentation

## 2022-05-01 HISTORY — DX: Anxiety disorder, unspecified: F41.9

## 2022-05-01 LAB — BASIC METABOLIC PANEL
Anion gap: 6 (ref 5–15)
BUN: 8 mg/dL (ref 6–20)
CO2: 20 mmol/L — ABNORMAL LOW (ref 22–32)
Calcium: 7.9 mg/dL — ABNORMAL LOW (ref 8.9–10.3)
Chloride: 108 mmol/L (ref 98–111)
Creatinine, Ser: 0.7 mg/dL (ref 0.44–1.00)
GFR, Estimated: >60 mL/min (ref >60)
Glucose, Bld: 119 mg/dL — ABNORMAL HIGH (ref 70–99)
Potassium: 3.5 mmol/L (ref 3.5–5.1)
Sodium: 134 mmol/L — ABNORMAL LOW (ref 135–145)

## 2022-05-01 LAB — CBC
HCT: 42.9 % (ref 36.0–46.0)
Hemoglobin: 13.8 g/dL (ref 12.0–15.0)
MCH: 26.7 pg (ref 26.0–34.0)
MCHC: 32.2 g/dL (ref 30.0–36.0)
MCV: 83 fL (ref 80.0–100.0)
Platelets: 201 10*3/uL (ref 150–400)
RBC: 5.17 MIL/uL — ABNORMAL HIGH (ref 3.87–5.11)
RDW: 14 % (ref 11.5–15.5)
WBC: 8.6 10*3/uL (ref 4.0–10.5)
nRBC: 0 % (ref 0.0–0.2)

## 2022-05-01 LAB — TROPONIN I (HIGH SENSITIVITY): Troponin I (High Sensitivity): <2 ng/L (ref <18)

## 2022-05-01 MED ORDER — HYDROXYZINE HCL 10 MG PO TABS: 10.0000 mg | ORAL_TABLET | Freq: Three times a day (TID) | ORAL | 0 refills | Status: AC | PRN

## 2022-05-01 MED ORDER — TRAZODONE HCL 50 MG PO TABS: 50.0000 mg | ORAL_TABLET | Freq: Every evening | ORAL | 2 refills | Status: AC | PRN

## 2022-05-01 MED ORDER — SERTRALINE HCL 100 MG PO TABS: 200.0000 mg | ORAL_TABLET | Freq: Every day | ORAL | 0 refills | Status: AC

## 2022-05-01 NOTE — ED Triage Notes (Signed)
Pt to ED POV for SOB (on and off) and "my heart is racing" that started suddenly about 2 hours. Also chest tightness to mid chest with radiation to L arm, 8/10 and mild dizziness. Hx anxiety and asthma. Unlabored respirations. Smokes 1/2 pack cigarettes per day.  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 Supposed to carry epipen but lost it. States has been out of anti anxiety meds for 3 months. Tried to get into [facility] for mental health care and Abilify injection but they needed her to see PCP first and she just moved here so doesn't have PCP yet. Pt states she just wants to make sure that the chest tightness is either anxiety-related or physical in nature and just wants to be on the safe side.

## 2022-05-01 NOTE — Discharge Instructions (Addendum)
Refills of your hydroxyzine, Zoloft and trazodone have been sent to your pharmacy. Please keep your appointment with [facility] this week

## 2022-05-01 NOTE — ED Provider Notes (Signed)
[facility] Provider Note  Patient Contact: 6:54 PM (approximate)   History   Shortness of Breath and Chest Pain   HPI  Caroline Ingram is a 37 y.o. female with a history of PTSD, bipolar type II, hypertension, anxiety and asthma, presents to the emergency department with chest tightness.  Patient reports that she was off of her psychiatric medications for the past several months and has recently establish care with [facility] but has not had a refill of her psychiatric medications yet.  She is requesting refills of Zoloft, trazodone and hydroxyzine.  She denies wheezing or cough.  No lower extremity swelling.  She states that she wanted to make sure that her occasional chest tightness and anxiety does not have a cardiac source.  Has been afebrile at home.  No nausea, vomiting or abdominal pain.      Physical Exam   Triage Vital Signs: ED Triage Vitals  Enc Vitals Group     BP 05/01/22 1514 (!) 140/99     Pulse Rate 05/01/22 1514 73     Resp 05/01/22 1514 20     Temp 05/01/22 1514 98.3 F (36.8 C)     Temp Source 05/01/22 1514 Oral     SpO2 05/01/22 1514 100 %     Weight 05/01/22 1515 204 lb (92.5 kg)     Height 05/01/22 1515 5\' 4"  (1.626 m)     Head Circumference --      Peak Flow --      Pain Score 05/01/22 1514 8     Pain Loc --      Pain Edu? --      Excl. in [place]? --     Most recent vital signs: Vitals:   05/01/22 1514 05/01/22 1845  BP: (!) 140/99 (!) 136/90  Pulse: 73 76  Resp: 20 20  Temp: 98.3 F (36.8 C) 98.1 F (36.7 C)  SpO2: 100% 100%     General: Alert and in no acute distress. Eyes:  PERRL. EOMI. Head: No acute traumatic findings ENT:      Nose: No congestion/rhinnorhea.      Mouth/Throat: Mucous membranes are moist. Neck: No stridor. No cervical spine tenderness to palpation. Cardiovascular:  Good peripheral perfusion Respiratory: Normal respiratory effort without tachypnea or retractions. Lungs CTAB. Good air entry to the  bases with no decreased or absent breath sounds. Gastrointestinal: Bowel sounds 4 quadrants. Soft and nontender to palpation. No guarding or rigidity. No palpable masses. No distention. No CVA tenderness. Musculoskeletal: Full range of motion to all extremities.  Neurologic:  No gross focal neurologic deficits are appreciated.  Skin:   No rash noted Other:   ED Results / Procedures / Treatments   Labs (all labs ordered are listed, but only abnormal results are displayed) Labs Reviewed  BASIC METABOLIC PANEL - Abnormal; Notable for the following components:      Result Value   Sodium 134 (*)    CO2 20 (*)    Glucose, Bld 119 (*)    Calcium 7.9 (*)    All other components within normal limits  CBC - Abnormal; Notable for the following components:   RBC 5.17 (*)    All other components within normal limits  POC URINE PREG, ED  TROPONIN I (HIGH SENSITIVITY)  TROPONIN I (HIGH SENSITIVITY)     EKG  Normal sinus rhythm without ST segment elevation or other apparent arrhythmia.   RADIOLOGY  I personally viewed and evaluated these images  as part of my medical decision making, as well as reviewing the written report by the radiologist.  ED Provider Interpretation: No acute abnormality on chest x-ray.   PROCEDURES:  Critical Care performed: No  Procedures   MEDICATIONS ORDERED IN ED: Medications - No data to display   IMPRESSION / MDM / [place] / ED COURSE  I reviewed the triage vital signs and the nursing notes.                              Assessment and plan Chest tightness Medication refill 37 year old female presents to the emergency department with chest tightness and request for medication refill.  Vital signs are reassuring at triage.  On exam, patient was alert and nontoxic-appearing.  Refills of patient's Zoloft, trazodone and hydroxyzine were prescribed as requested and recommended keeping appointment with [facility] for later this week.  CBC, BMP  and troponin within range.  Chest x-ray shows no acute abnormality.  EKG indicates normal sinus rhythm without ST segment elevation or other apparent arrhythmia.  Patient feels comfortable being discharged.  Return precautions were given to return with new or worsening symptoms.     FINAL CLINICAL IMPRESSION(S) / ED DIAGNOSES   Final diagnoses:  Chest tightness  Medication refill     Rx / DC Orders   ED Discharge Orders          Ordered    traZODone (DESYREL) 50 MG tablet  At bedtime PRN        05/01/22 1839    hydrOXYzine (ATARAX) 10 MG tablet  3 times daily PRN        05/01/22 1839    sertraline (ZOLOFT) 100 MG tablet  Daily        05/01/22 1839             Note:  This document was prepared using Dragon voice recognition software and may include unintentional dictation errors.   Vallarie Mare [place], Hershal Coria 05/01/22 1857    Blake Divine, MD 05/01/22 1919

## 2022-06-01 ENCOUNTER — Emergency Department
Admission: EM | Admit: 2022-06-01 | Discharge: 2022-06-01 | Disposition: A | Discharge status: Home / Self Care | Payer: Medicaid Other | Attending: Emergency Medicine | Admitting: Emergency Medicine

## 2022-06-01 ENCOUNTER — Other Ambulatory Visit: Payer: Self-pay

## 2022-06-01 DIAGNOSIS — J45909 Unspecified asthma, uncomplicated: Secondary | ICD-10-CM | POA: Diagnosis not present

## 2022-06-01 DIAGNOSIS — J069 Acute upper respiratory infection, unspecified: Secondary | ICD-10-CM | POA: Diagnosis not present

## 2022-06-01 DIAGNOSIS — Z8616 Personal history of COVID-19: Secondary | ICD-10-CM | POA: Insufficient documentation

## 2022-06-01 DIAGNOSIS — Z1152 Encounter for screening for COVID-19: Secondary | ICD-10-CM | POA: Diagnosis not present

## 2022-06-01 DIAGNOSIS — I10 Essential (primary) hypertension: Secondary | ICD-10-CM | POA: Insufficient documentation

## 2022-06-01 DIAGNOSIS — R059 Cough, unspecified: Secondary | ICD-10-CM | POA: Diagnosis present

## 2022-06-01 LAB — RESP PANEL BY RT-PCR (RSV, FLU A&B, COVID)  RVPGX2
Influenza A by PCR: NEGATIVE
Influenza B by PCR: NEGATIVE
Resp Syncytial Virus by PCR: NEGATIVE
SARS Coronavirus 2 by RT PCR: NEGATIVE

## 2022-06-01 MED ORDER — ALBUTEROL SULFATE HFA 108 (90 BASE) MCG/ACT IN AERS: 2.0000 | INHALATION_SPRAY | Freq: Four times a day (QID) | RESPIRATORY_TRACT | 0 refills | Status: DC | PRN

## 2022-06-01 NOTE — ED Provider Notes (Signed)
[facility] Provider Note    Event Date/Time   First MD Initiated Contact with Patient 06/01/22 [phone]     (approximate)   History   Cough   HPI  Caroline Ingram is a 37 y.o. female with a past medical history of hypertension, depression, PTSD, asthma who presents today for evaluation of cough for the past 5 days.  Patient denies any known sick contacts.  She reports that she has run out of her albuterol inhaler.  No fevers or chills.  No chest pain or shortness of breath.  No lower extremity swelling or edema.  She reports that she has run out of her inhaler and lost her other 1.  She is requesting a refill today.  She reports that she is going to see her 2 children this weekend who were born premature and she wants to be sure that she does not have anything contagious to them.  Patient Active Problem List   Diagnosis Date Noted   Attention deficit hyperactivity disorder (ADHD), predominantly inattentive type 09/11/2020   Major depressive disorder, recurrent episode, moderate with anxious distress ([place]) 07/31/2020   Grief 06/08/2020   PTSD (post-traumatic stress disorder) 06/08/2020   Preterm labor in third trimester 05/16/2020   Vaginal delivery 05/16/2020   Threatened preterm labor, second trimester 05/11/2020   COVID-19 affecting pregnancy in second trimester 05/11/2020   Pregnancy affected by fetal growth restriction 05/08/2020   Cervical insufficiency during pregnancy, antepartum 05/08/2020   Essential hypertension 05/08/2020   Mixed hyperlipidemia 05/08/2020   Moderate persistent asthma, uncomplicated [id number]   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 04/23/2020   Supervision of high risk pregnancy, antepartum 01/13/2020   Psychosocial stressors 04/10/2019   Obesity in pregnancy 03/27/2019   Unwanted fertility 03/27/2019   History of preterm delivery, currently pregnant 11/05/2018   Chronic hypertension affecting pregnancy  11/05/2018   Nexplanon in place 11/05/2018   Marijuana abuse 11/05/2018   Tobacco use disorder 11/05/2018   Bipolar 2 disorder, major depressive episode ([place]) 11/14/2017   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 --      Excl. in [place]? --     Most recent vital signs: Vitals:   06/01/22 0930  BP: 131/85  Pulse: 64  Resp: 16  Temp: 98.4 F (36.9 C)  SpO2: 99%    Physical Exam Vitals and nursing note reviewed.  Constitutional:      General: Awake and alert. No acute distress.    Appearance: Normal appearance. The patient is normal weight.  HENT:     Head: Normocephalic and atraumatic.     Mouth: Mucous membranes are  moist.  Eyes:     General: PERRL. Normal EOMs        Right eye: No discharge.        Left eye: No discharge.     Conjunctiva/sclera: Conjunctivae normal.  Cardiovascular:     Rate and Rhythm: Normal rate and regular rhythm.     Pulses: Normal pulses.  Pulmonary:     Effort: Pulmonary effort is normal. No respiratory distress.     Breath sounds: Normal breath sounds.  Able to speak easily in complete sentences.  Dry cough on exam. Abdominal:     Abdomen is soft. There is no abdominal tenderness. No rebound or guarding. No distention. Musculoskeletal:        General: No swelling. Normal range of motion.     Cervical back: Normal range of motion and neck supple.  Skin:    General: Skin is warm and dry.     Capillary  Refill: Capillary refill takes less than 2 seconds.     Findings: No rash.  Neurological:     Mental Status: The patient is awake and alert.      ED Results / Procedures / Treatments   Labs (all labs ordered are listed, but only abnormal results are displayed) Labs Reviewed  RESP PANEL BY RT-PCR (RSV, FLU A&B, COVID)  [id number]     EKG     RADIOLOGY     PROCEDURES:  Critical Care performed:   Procedures   MEDICATIONS ORDERED IN ED: Medications - No data to display   IMPRESSION / MDM / [place] / ED COURSE  I reviewed the triage vital signs and the nursing notes.   Differential diagnosis includes, but is not limited to, viral etiology, bronchitis, pneumonia, COVID, RSV, pneumonia.   Patient is awake and alert, hemodynamically stable and afebrile.  She has a normal oxygen saturation of 99% on room air and demonstrates no increased work of breathing.  She has a dry cough on exam.  She is able to speak easily in complete sentences.  Obtained in triage is negative for COVID, flu, and RSV.  Symptoms are most consistent with other viral etiology.  No fever or increased work of breathing or pain, I do not suspect pneumonia.  Patient reports that she is going to see her 2 babies who are both born premature this weekend and she is afraid that she might have something that is contagious to them.  She requested a note saying that her cough is contagious because she does not want CPS to think that she just does not want to see her kids.  Her albuterol inhaler was refilled for her as this may also help with her bronchospasms.  We discussed return precautions and outpatient follow-up.  Patient understands and agrees with plan.  She was discharged in stable condition.   Patient's presentation is most consistent with acute complicated illness / injury requiring diagnostic workup.     FINAL CLINICAL IMPRESSION(S) / ED DIAGNOSES   Final diagnoses:  Viral URI with cough      Rx / DC Orders   ED Discharge Orders          Ordered    albuterol (VENTOLIN HFA) 108 (90 Base) MCG/ACT inhaler  Every 6 hours PRN        06/01/22 1023             Note:  This document was prepared using Dragon voice recognition software and may include unintentional dictation errors.  Emeline Gins 06/01/22 1445    Arta Silence, MD 06/01/22 [phone]

## 2022-06-01 NOTE — Discharge Instructions (Signed)
Your COVID/flu/RSV is negative. You likely have another viral infection, which is likely contagious to others.  Please follow-up with your outpatient provider.  Your inhaler was refilled for you.  Please return for any new, worsening, or change in symptoms or other concerns.  It was a pleasure caring for you today.

## 2022-06-01 NOTE — ED Triage Notes (Signed)
Pt to ED for dry cough since Saturday. When she coughs a lot she feels nauseous and sometimes vomits. Has committed twice today. Wanted to be checked for covid etc. Her kids had RSV last week.

## 2022-06-02 ENCOUNTER — Telehealth: Payer: Medicaid Other | Admitting: Emergency Medicine

## 2022-06-02 DIAGNOSIS — J069 Acute upper respiratory infection, unspecified: Secondary | ICD-10-CM | POA: Diagnosis not present

## 2022-06-02 MED ORDER — ONDANSETRON 4 MG PO TBDP: 4.0000 mg | ORAL_TABLET | Freq: Three times a day (TID) | ORAL | 0 refills | Status: AC | PRN

## 2022-06-02 MED ORDER — BENZONATATE 100 MG PO CAPS: 100.0000 mg | ORAL_CAPSULE | Freq: Two times a day (BID) | ORAL | 0 refills | Status: DC | PRN

## 2022-06-02 NOTE — Progress Notes (Signed)
Virtual Visit Consent   Caroline Ingram, you are scheduled for a virtual visit with a [facility] provider today. Just as with appointments in the office, your consent must be obtained to participate. Your consent will be active for this visit and any virtual visit you may have with one of our providers in the next 365 days. If you have a MyChart account, a copy of this consent can be sent to you electronically.  As this is a virtual visit, video technology does not allow for your provider to perform a traditional examination. This may limit your provider's ability to fully assess your condition. If your provider identifies any concerns that need to be evaluated in person or the need to arrange testing (such as labs, EKG, etc.), we will make arrangements to do so. Although advances in technology are sophisticated, we cannot ensure that it will always work on either your end or our end. If the connection with a video visit is poor, the visit may have to be switched to a telephone visit. With either a video or telephone visit, we are not always able to ensure that we have a secure connection.  By engaging in this virtual visit, you consent to the provision of healthcare and authorize for your insurance to be billed (if applicable) for the services provided during this visit. Depending on your insurance coverage, you may receive a charge related to this service.  I need to obtain your verbal consent now. Are you willing to proceed with your visit today? Caroline Ingram has provided verbal consent on 06/02/2022 for a virtual visit (video or telephone). Montine Circle, PA-C  Date: 06/02/2022 10:59 AM  Virtual Visit via Video Note   I, Montine Circle, connected with  Caroline Ingram  ([id number], [birth year]) on 06/02/22 at 11:00 AM EST by a video-enabled telemedicine application and verified that I am speaking with the correct person using two identifiers.  Location: Patient: Virtual Visit Location  Patient: Home Provider: Virtual Visit Location Provider: Home Office   I discussed the limitations of evaluation and management by telemedicine and the availability of in person appointments. The patient expressed understanding and agreed to proceed.    History of Present Illness: Caroline Ingram is a 37 y.o. who identifies as a female who was assigned female at birth, and is being seen today for needing a note from work.  States that she was diagnosed with URI yesterday at the ER.  She states that she has been having flu-like symptoms.  States that she has also had some post-tussive emesis.  HPI: HPI  Problems:  Patient Active Problem List   Diagnosis Date Noted   Attention deficit hyperactivity disorder (ADHD), predominantly inattentive type 09/11/2020   Major depressive disorder, recurrent episode, moderate with anxious distress ([place]) 07/31/2020   Grief 06/08/2020   PTSD (post-traumatic stress disorder) 06/08/2020   Preterm labor in third trimester 05/16/2020   Vaginal delivery 05/16/2020   Threatened preterm labor, second trimester 05/11/2020   COVID-19 affecting pregnancy in second trimester 05/11/2020   Pregnancy affected by fetal growth restriction 05/08/2020   Cervical insufficiency during pregnancy, antepartum 05/08/2020   Essential hypertension 05/08/2020   Mixed hyperlipidemia 05/08/2020   Moderate persistent asthma, uncomplicated [id number]   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 04/23/2020   Supervision of high risk pregnancy, antepartum 01/13/2020   Psychosocial stressors 04/10/2019   Obesity in pregnancy 03/27/2019   Unwanted fertility 03/27/2019   History of preterm delivery, currently pregnant 11/05/2018  Chronic hypertension affecting pregnancy 11/05/2018   Nexplanon in place 11/05/2018   Marijuana abuse 11/05/2018   Tobacco use disorder 11/05/2018   Bipolar 2 disorder, major depressive episode ([place]) 11/14/2017   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 11/14/2017   DEPRESSION 01/13/2009   Asthma 01/13/2009    Allergies:  Allergies  Allergen Reactions   Chocolate Anaphylaxis   Shrimp [Shellfish Allergy] Anaphylaxis   Iodine Itching   Medications:  Current Outpatient Medications:    albuterol (VENTOLIN HFA) 108 (90 Base) MCG/ACT inhaler, Inhale 2 puffs into the lungs every 4 (four) hours as needed for wheezing or shortness of breath., Disp: 18 g, Rfl: 2   albuterol (VENTOLIN HFA) 108 (90 Base) MCG/ACT inhaler, Inhale 2 puffs into the lungs every 4 (four) hours as needed for wheezing or shortness of breath., Disp: 1 each, Rfl: 0   albuterol (VENTOLIN HFA) 108 (90 Base) MCG/ACT inhaler, Inhale 2 puffs into the lungs every 6 (six) hours as needed for wheezing or shortness of breath., Disp: 8 g, Rfl: 0   amLODipine (NORVASC) 5 MG tablet, TAKE 1 TABLET (5 MG TOTAL) BY MOUTH DAILY. (Patient not taking: Reported on 12/22/2020), Disp: 45 tablet, Rfl: 1   ARIPiprazole ER (ABILIFY MAINTENA) 400 MG PRSY prefilled syringe, Inject 400 mg into the muscle every 28 (twenty-eight) days., Disp: 1 each, Rfl: 12   ARIPiprazole, sensor, 10 MG TABS, Take 10 mg by mouth daily., Disp: 14 tablet, Rfl: 0   atomoxetine (STRATTERA) 40 MG capsule, TAKE 1 CAPSULE (40 MG TOTAL) BY MOUTH DAILY. (Patient not taking: Reported on 12/22/2020), Disp: 30 capsule, Rfl: 2   coconut oil OIL, Apply 1 application topically as needed (nipple pain). (Patient not taking: Reported on 12/22/2020), Disp: , Rfl: 0   cyclobenzaprine (FLEXERIL) 5 MG tablet, Take 5 mg by mouth 3 (three) times daily as needed for muscle spasms. (Patient not taking: Reported on 12/22/2020), Disp: , Rfl:    hydrOXYzine (ATARAX) 10 MG tablet, Take 1 tablet (10 mg total) by mouth 3 (three) times daily as needed., Disp: 30 tablet, Rfl: 0   ibuprofen (ADVIL) 600 MG tablet, Take 1 tablet (600 mg total) by mouth every 6 (six) hours as needed for moderate pain or cramping. (Patient not taking:  Reported on 12/22/2020), Disp: 30 tablet, Rfl: 0   mometasone-formoterol (DULERA) 100-5 MCG/ACT AERO, Inhale 2 puffs into the lungs 2 (two) times daily as needed for wheezing or shortness of breath., Disp: , Rfl:    polyethylene glycol powder (GLYCOLAX/MIRALAX) 17 GM/SCOOP powder, Take 17 g by mouth daily as needed. (Patient not taking: Reported on 12/22/2020), Disp: 510 g, Rfl: 3   predniSONE (DELTASONE) 20 MG tablet, Take 2 tablets (40 mg total) by mouth daily. Take 40 mg by mouth daily for 3 days, then 59m by mouth daily for 3 days, then 17mdaily for 3 days (Patient not taking: Reported on 12/22/2020), Disp: 12 tablet, Rfl: 0   Prenatal Vit-Fe Fumarate-FA (PRENATAL MULTIVITAMIN) TABS tablet, Take 1 tablet by mouth daily at 12 noon. (Patient not taking: Reported on 12/22/2020), Disp: , Rfl:    Pseudoeph-Doxylamine-DM-APAP (NYQUIL PO), Take 1 Dose by mouth at bedtime as needed (cough/cold). (Patient not taking: Reported on 12/22/2020), Disp: , Rfl:    sertraline (ZOLOFT) 100 MG tablet, Take 2 tablets (200 mg total) by mouth daily., Disp: 60 tablet, Rfl: 0   traZODone (DESYREL) 50 MG tablet, Take 1 tablet (50 mg total) by mouth at bedtime as needed for sleep., Disp:  30 tablet, Rfl: 2  Observations/Objective: Patient is well-developed, well-nourished in no acute distress.  Resting comfortably at home.  Head is normocephalic, atraumatic.  No labored breathing.  Speech is clear and coherent with logical content.  Patient is alert and oriented at baseline.    Assessment and Plan: 1. Upper respiratory tract infection, unspecified type  Meds ordered this encounter  Medications   ondansetron (ZOFRAN-ODT) 4 MG disintegrating tablet    Sig: Take 1 tablet (4 mg total) by mouth every 8 (eight) hours as needed for nausea or vomiting.    Dispense:  10 tablet    Refill:  0    Order Specific Question:   Supervising Provider    Answer:   Chase Picket [id number]   benzonatate (TESSALON) 100 MG capsule     Sig: Take 1 capsule (100 mg total) by mouth 2 (two) times daily as needed for cough.    Dispense:  20 capsule    Refill:  0    Order Specific Question:   Supervising Provider    Answer:   Chase Picket [id number]   Patient was seen yesterday in an ED and diagnosed with URI.  States she needs a note for work.  Also says she has some post-tussive emesis.  Will give RX for zofran and tessalon to help with symptoms.  Otherwise, supportive care recommended.  Follow Up Instructions: I discussed the assessment and treatment plan with the patient. The patient was provided an opportunity to ask questions and all were answered. The patient agreed with the plan and demonstrated an understanding of the instructions.  A copy of instructions were sent to the patient via MyChart unless otherwise noted below.     The patient was advised to call back or seek an in-person evaluation if the symptoms worsen or if the condition fails to improve as anticipated.  Time:  I spent 12 minutes with the patient via telehealth technology discussing the above problems/concerns.    Montine Circle, PA-C

## 2022-06-02 NOTE — Patient Instructions (Signed)
Caroline Ingram, thank you for joining Montine Circle, PA-C for today's virtual visit.  While this provider is not your primary care provider (PCP), if your PCP is located in our provider database this encounter information will be shared with them immediately following your visit.   [facility] account gives you access to today's visit and all your visits, tests, and labs performed at [facility] " click here if you don't have a [facility] account or go to [email or web address]  Consent: (Patient) Caroline Ingram provided verbal consent for this virtual visit at the beginning of the encounter.  Current Medications:  Current Outpatient Medications:    benzonatate (TESSALON) 100 MG capsule, Take 1 capsule (100 mg total) by mouth 2 (two) times daily as needed for cough., Disp: 20 capsule, Rfl: 0   ondansetron (ZOFRAN-ODT) 4 MG disintegrating tablet, Take 1 tablet (4 mg total) by mouth every 8 (eight) hours as needed for nausea or vomiting., Disp: 10 tablet, Rfl: 0   albuterol (VENTOLIN HFA) 108 (90 Base) MCG/ACT inhaler, Inhale 2 puffs into the lungs every 4 (four) hours as needed for wheezing or shortness of breath., Disp: 18 g, Rfl: 2   albuterol (VENTOLIN HFA) 108 (90 Base) MCG/ACT inhaler, Inhale 2 puffs into the lungs every 4 (four) hours as needed for wheezing or shortness of breath., Disp: 1 each, Rfl: 0   albuterol (VENTOLIN HFA) 108 (90 Base) MCG/ACT inhaler, Inhale 2 puffs into the lungs every 6 (six) hours as needed for wheezing or shortness of breath., Disp: 8 g, Rfl: 0   amLODipine (NORVASC) 5 MG tablet, TAKE 1 TABLET (5 MG TOTAL) BY MOUTH DAILY. (Patient not taking: Reported on 12/22/2020), Disp: 45 tablet, Rfl: 1   ARIPiprazole ER (ABILIFY MAINTENA) 400 MG PRSY prefilled syringe, Inject 400 mg into the muscle every 28 (twenty-eight) days., Disp: 1 each, Rfl: 12   ARIPiprazole, sensor, 10 MG TABS, Take 10 mg by mouth daily., Disp: 14 tablet, Rfl:  0   atomoxetine (STRATTERA) 40 MG capsule, TAKE 1 CAPSULE (40 MG TOTAL) BY MOUTH DAILY. (Patient not taking: Reported on 12/22/2020), Disp: 30 capsule, Rfl: 2   coconut oil OIL, Apply 1 application topically as needed (nipple pain). (Patient not taking: Reported on 12/22/2020), Disp: , Rfl: 0   cyclobenzaprine (FLEXERIL) 5 MG tablet, Take 5 mg by mouth 3 (three) times daily as needed for muscle spasms. (Patient not taking: Reported on 12/22/2020), Disp: , Rfl:    hydrOXYzine (ATARAX) 10 MG tablet, Take 1 tablet (10 mg total) by mouth 3 (three) times daily as needed., Disp: 30 tablet, Rfl: 0   ibuprofen (ADVIL) 600 MG tablet, Take 1 tablet (600 mg total) by mouth every 6 (six) hours as needed for moderate pain or cramping. (Patient not taking: Reported on 12/22/2020), Disp: 30 tablet, Rfl: 0   mometasone-formoterol (DULERA) 100-5 MCG/ACT AERO, Inhale 2 puffs into the lungs 2 (two) times daily as needed for wheezing or shortness of breath., Disp: , Rfl:    polyethylene glycol powder (GLYCOLAX/MIRALAX) 17 GM/SCOOP powder, Take 17 g by mouth daily as needed. (Patient not taking: Reported on 12/22/2020), Disp: 510 g, Rfl: 3   predniSONE (DELTASONE) 20 MG tablet, Take 2 tablets (40 mg total) by mouth daily. Take 40 mg by mouth daily for 3 days, then 98m by mouth daily for 3 days, then 135mdaily for 3 days (Patient not taking: Reported on 12/22/2020), Disp: 12 tablet, Rfl: 0   Prenatal Vit-Fe Fumarate-FA (PRENATAL  MULTIVITAMIN) TABS tablet, Take 1 tablet by mouth daily at 12 noon. (Patient not taking: Reported on 12/22/2020), Disp: , Rfl:    Pseudoeph-Doxylamine-DM-APAP (NYQUIL PO), Take 1 Dose by mouth at bedtime as needed (cough/cold). (Patient not taking: Reported on 12/22/2020), Disp: , Rfl:    sertraline (ZOLOFT) 100 MG tablet, Take 2 tablets (200 mg total) by mouth daily., Disp: 60 tablet, Rfl: 0   traZODone (DESYREL) 50 MG tablet, Take 1 tablet (50 mg total) by mouth at bedtime as needed for sleep., Disp: 30 tablet,  Rfl: 2   Medications ordered in this encounter:  Meds ordered this encounter  Medications   ondansetron (ZOFRAN-ODT) 4 MG disintegrating tablet    Sig: Take 1 tablet (4 mg total) by mouth every 8 (eight) hours as needed for nausea or vomiting.    Dispense:  10 tablet    Refill:  0    Order Specific Question:   Supervising Provider    Answer:   Chase Picket [id number]   benzonatate (TESSALON) 100 MG capsule    Sig: Take 1 capsule (100 mg total) by mouth 2 (two) times daily as needed for cough.    Dispense:  20 capsule    Refill:  0    Order Specific Question:   Supervising Provider    Answer:   Chase Picket [id number]     *If you need refills on other medications prior to your next appointment, please contact your pharmacy*  Follow-Up: Call back or seek an in-person evaluation if the symptoms worsen or if the condition fails to improve as anticipated.  [facility] [phone]  Other Instructions    If you have been instructed to have an in-person evaluation today at a local Urgent Care facility, please use the link below. It will take you to a list of all of our available [facility], including address, phone number and hours of operation. Please do not delay care.  [facility]  If you or a family member do not have a primary care provider, use the link below to schedule a visit and establish care. When you choose a [facility] primary care physician or advanced practice provider, you gain a long-term partner in health. Find a Primary Care Provider  Learn more about 's in-office and virtual care options: [facility]

## 2022-07-10 ENCOUNTER — Telehealth: Payer: Medicaid Other | Admitting: Family

## 2022-07-10 DIAGNOSIS — M545 Low back pain, unspecified: Secondary | ICD-10-CM

## 2022-07-10 MED ORDER — NAPROXEN 500 MG PO TABS: 500.0000 mg | ORAL_TABLET | Freq: Two times a day (BID) | ORAL | 0 refills | Status: AC

## 2022-07-10 MED ORDER — BACLOFEN 10 MG PO TABS: 10.0000 mg | ORAL_TABLET | Freq: Three times a day (TID) | ORAL | 0 refills | Status: DC

## 2022-07-10 NOTE — Progress Notes (Signed)

## 2022-07-28 ENCOUNTER — Other Ambulatory Visit: Payer: Self-pay

## 2022-07-28 ENCOUNTER — Emergency Department
Admission: EM | Admit: 2022-07-28 | Discharge: 2022-07-28 | Disposition: A | Discharge status: Home / Self Care | Payer: Medicaid Other | Attending: Emergency Medicine | Admitting: Emergency Medicine

## 2022-07-28 DIAGNOSIS — K0889 Other specified disorders of teeth and supporting structures: Secondary | ICD-10-CM | POA: Diagnosis present

## 2022-07-28 DIAGNOSIS — J45909 Unspecified asthma, uncomplicated: Secondary | ICD-10-CM | POA: Insufficient documentation

## 2022-07-28 DIAGNOSIS — I10 Essential (primary) hypertension: Secondary | ICD-10-CM | POA: Diagnosis not present

## 2022-07-28 MED ORDER — TRAMADOL HCL 50 MG PO TABS: 50.0000 mg | ORAL_TABLET | Freq: Four times a day (QID) | ORAL | 0 refills | Status: AC | PRN

## 2022-07-28 MED ORDER — AMOXICILLIN 500 MG PO TABS: 500.0000 mg | ORAL_TABLET | Freq: Three times a day (TID) | ORAL | 0 refills | Status: DC

## 2022-07-28 NOTE — ED Triage Notes (Signed)
Pt to ED for right upper dental pain for one week. States doesn't have dental appt until end of month.

## 2022-07-28 NOTE — ED Provider Notes (Signed)
[facility] Provider Note    Event Date/Time   First MD Initiated Contact with Patient 07/28/22 [phone]     (approximate)   History   Dental Pain   HPI  Caroline Ingram is a 37 y.o. female with history of asthma, bipolar 2, hypertension, hyperlipidemia and as listed in EMR presents to the emergency department for treatment and evaluation of dental pain that has been present for the past week but got worse overnight.  Patient was able to schedule a dentist appointment for the end of the month but no sooner.  She denies fever or facial swelling.Marland Kitchen      Physical Exam   Triage Vital Signs: ED Triage Vitals  Enc Vitals Group     BP 07/28/22 0923 (!) 155/83     Pulse Rate 07/28/22 0922 65     Resp 07/28/22 0922 18     Temp 07/28/22 0922 98.3 F (36.8 C)     Temp src --      SpO2 07/28/22 0922 100 %     Weight 07/28/22 0923 206 lb (93.4 kg)     Height 07/28/22 0923 5\' 4"  (1.626 m)     Head Circumference --      Peak Flow --      Pain Score 07/28/22 0923 10     Pain Loc --      Pain Edu? --      Excl. in GC? --     Most recent vital signs: Vitals:   07/28/22 0922 07/28/22 0923  BP:  (!) 155/83  Pulse: 65   Resp: 18   Temp: 98.3 F (36.8 C)   SpO2: 100%     General: Awake, no distress.  CV:  Good peripheral perfusion.  Resp:  Normal effort.  Abd:  No distention.  Other:  Second bicuspid on the upper right with surrounding erythema of the gingiva.  No obvious decay.   ED Results / Procedures / Treatments   Labs (all labs ordered are listed, but only abnormal results are displayed) Labs Reviewed - No data to display   EKG  Not indicated   RADIOLOGY  PROCEDURES:  Critical Care performed: No  Procedures   MEDICATIONS ORDERED IN ED:  Medications - No data to display   IMPRESSION / MDM / ASSESSMENT AND PLAN / ED COURSE   I have reviewed the triage note.  Differential diagnosis includes, but is not limited to, dental  pain, dental abscess, gingivitis  Patient's presentation is most consistent with acute, uncomplicated illness.  37 year old female presenting to the emergency department for treatment and evaluation of dental pain.  See HPI for further details.  Plan will be to get her started on amoxicillin and provide short course of tramadol to help with pain control.  She was also advised to continue taking her ibuprofen.  If symptoms change or worsen and she is unable to get an earlier appointment with her dentist, she is to return to the emergency department.      FINAL CLINICAL IMPRESSION(S) / ED DIAGNOSES   Final diagnoses:  Pain, dental     Rx / DC Orders   ED Discharge Orders          Ordered    amoxicillin (AMOXIL) 500 MG tablet  3 times daily        07/28/22 1042    traMADol (ULTRAM) 50 MG tablet  Every 6 hours PRN        07/28/22 1042  Note:  This document was prepared using Dragon voice recognition software and may include unintentional dictation errors.   Chinita Pester, FNP 07/28/22 1100    Pilar Jarvis, MD 07/28/22 [phone]

## 2022-08-23 ENCOUNTER — Telehealth: Payer: Medicaid Other | Admitting: Physician Assistant

## 2022-08-23 DIAGNOSIS — M5431 Sciatica, right side: Secondary | ICD-10-CM

## 2022-08-23 DIAGNOSIS — J4540 Moderate persistent asthma, uncomplicated: Secondary | ICD-10-CM | POA: Diagnosis not present

## 2022-08-23 MED ORDER — PREDNISONE 10 MG (21) PO TBPK: ORAL_TABLET | ORAL | 0 refills | Status: AC

## 2022-08-23 MED ORDER — ALBUTEROL SULFATE HFA 108 (90 BASE) MCG/ACT IN AERS: 2.0000 | INHALATION_SPRAY | Freq: Four times a day (QID) | RESPIRATORY_TRACT | 0 refills | Status: AC | PRN

## 2022-08-23 MED ORDER — DULERA 100-5 MCG/ACT IN AERO: 2.0000 | INHALATION_SPRAY | Freq: Two times a day (BID) | RESPIRATORY_TRACT | 0 refills | Status: AC | PRN

## 2022-08-23 MED ORDER — CYCLOBENZAPRINE HCL 10 MG PO TABS: 10.0000 mg | ORAL_TABLET | Freq: Three times a day (TID) | ORAL | 0 refills | Status: AC | PRN

## 2022-08-23 NOTE — Patient Instructions (Addendum)
Caroline Ingram, thank you for joining Piedad Climes, PA-C for today's virtual visit.  While this provider is not your primary care provider (PCP), if your PCP is located in our provider database this encounter information will be shared with them immediately following your visit.   A [facility] MyChart account gives you access to today's visit and all your visits, tests, and labs performed at [facility] " click here if you don't have a [facility] MyChart account or go to [email or web address]  Consent: (Patient) Caroline Ingram provided verbal consent for this virtual visit at the beginning of the encounter.  Current Medications:  Current Outpatient Medications:    cyclobenzaprine (FLEXERIL) 10 MG tablet, Take 1 tablet (10 mg total) by mouth 3 (three) times daily as needed for muscle spasms., Disp: 30 tablet, Rfl: 0   predniSONE (STERAPRED UNI-PAK 21 TAB) 10 MG (21) TBPK tablet, Take following package directions, Disp: 21 tablet, Rfl: 0   albuterol (VENTOLIN HFA) 108 (90 Base) MCG/ACT inhaler, Inhale 2 puffs into the lungs every 6 (six) hours as needed for wheezing or shortness of breath., Disp: 8 g, Rfl: 0   amLODipine (NORVASC) 5 MG tablet, TAKE 1 TABLET (5 MG TOTAL) BY MOUTH DAILY. (Patient not taking: Reported on 12/22/2020), Disp: 45 tablet, Rfl: 1   ARIPiprazole ER (ABILIFY MAINTENA) 400 MG PRSY prefilled syringe, Inject 400 mg into the muscle every 28 (twenty-eight) days., Disp: 1 each, Rfl: 12   coconut oil OIL, Apply 1 application topically as needed (nipple pain). (Patient not taking: Reported on 12/22/2020), Disp: , Rfl: 0   hydrOXYzine (ATARAX) 10 MG tablet, Take 1 tablet (10 mg total) by mouth 3 (three) times daily as needed., Disp: 30 tablet, Rfl: 0   ibuprofen (ADVIL) 600 MG tablet, Take 1 tablet (600 mg total) by mouth every 6 (six) hours as needed for moderate pain or cramping. (Patient not taking: Reported on 12/22/2020), Disp: 30 tablet, Rfl: 0    mometasone-formoterol (DULERA) 100-5 MCG/ACT AERO, Inhale 2 puffs into the lungs 2 (two) times daily as needed for wheezing or shortness of breath., Disp: 1 each, Rfl: 0   naproxen (NAPROSYN) 500 MG tablet, Take 1 tablet (500 mg total) by mouth 2 (two) times daily with a meal., Disp: 30 tablet, Rfl: 0   ondansetron (ZOFRAN-ODT) 4 MG disintegrating tablet, Take 1 tablet (4 mg total) by mouth every 8 (eight) hours as needed for nausea or vomiting., Disp: 10 tablet, Rfl: 0   Prenatal Vit-Fe Fumarate-FA (PRENATAL MULTIVITAMIN) TABS tablet, Take 1 tablet by mouth daily at 12 noon. (Patient not taking: Reported on 12/22/2020), Disp: , Rfl:    Pseudoeph-Doxylamine-DM-APAP (NYQUIL PO), Take 1 Dose by mouth at bedtime as needed (cough/cold). (Patient not taking: Reported on 12/22/2020), Disp: , Rfl:    sertraline (ZOLOFT) 100 MG tablet, Take 2 tablets (200 mg total) by mouth daily., Disp: 60 tablet, Rfl: 0   traMADol (ULTRAM) 50 MG tablet, Take 1 tablet (50 mg total) by mouth every 6 (six) hours as needed., Disp: 12 tablet, Rfl: 0   traZODone (DESYREL) 50 MG tablet, Take 1 tablet (50 mg total) by mouth at bedtime as needed for sleep., Disp: 30 tablet, Rfl: 2   Medications ordered in this encounter:  Meds ordered this encounter  Medications   predniSONE (STERAPRED UNI-PAK 21 TAB) 10 MG (21) TBPK tablet    Sig: Take following package directions    Dispense:  21 tablet    Refill:  0  Order Specific Question:   Supervising Provider    Answer:   Merrilee Jansky [[id number]]   cyclobenzaprine (FLEXERIL) 10 MG tablet    Sig: Take 1 tablet (10 mg total) by mouth 3 (three) times daily as needed for muscle spasms.    Dispense:  30 tablet    Refill:  0    Order Specific Question:   Supervising Provider    Answer:   Loreli Dollar   albuterol (VENTOLIN HFA) 108 (90 Base) MCG/ACT inhaler    Sig: Inhale 2 puffs into the lungs every 6 (six) hours as needed for wheezing or shortness of breath.     Dispense:  8 g    Refill:  0    Order Specific Question:   Supervising Provider    Answer:   Merrilee Jansky [[id number]]   mometasone-formoterol (DULERA) 100-5 MCG/ACT AERO    Sig: Inhale 2 puffs into the lungs 2 (two) times daily as needed for wheezing or shortness of breath.    Dispense:  1 each    Refill:  0    Order Specific Question:   Supervising Provider    Answer:   Merrilee Jansky [id number]     *If you need refills on other medications prior to your next appointment, please contact your pharmacy*  Follow-Up: Call back or seek an in-person evaluation if the symptoms worsen or if the condition fails to improve as anticipated.  [facility] [phone]  Other Instructions Please avoid heavy lifting and overexertion. Apply heating pad to lower back for 10-15 minutes, a few times per day. Get a donut cushion to sit on if you are able. Take the prednisone as directed. The Flexeril is for evening use.   Use link below to help establish with a new PCP.  I have included some resources here for you to be seen in person at an Ortho clinic.     If you have been instructed to have an in-person evaluation today at a local Urgent Care facility, please use the link below. It will take you to a list of all of our available [facility], including address, phone number and hours of operation. Please do not delay care.  [facility]  If you or a family member do not have a primary care provider, use the link below to schedule a visit and establish care. When you choose a [facility] primary care physician or advanced practice provider, you gain a long-term partner in health. Find a Primary Care Provider  Learn more about [facility]'s in-office and virtual care options: [facility] - Get Care Now

## 2022-08-23 NOTE — Progress Notes (Signed)
Virtual Visit Consent   Caroline Ingram, you are scheduled for a virtual visit with a [facility] provider today. Just as with appointments in the office, your consent must be obtained to participate. Your consent will be active for this visit and any virtual visit you may have with one of our providers in the next 365 days. If you have a MyChart account, a copy of this consent can be sent to you electronically.  As this is a virtual visit, video technology does not allow for your provider to perform a traditional examination. This may limit your provider's ability to fully assess your condition. If your provider identifies any concerns that need to be evaluated in person or the need to arrange testing (such as labs, EKG, etc.), we will make arrangements to do so. Although advances in technology are sophisticated, we cannot ensure that it will always work on either your end or our end. If the connection with a video visit is poor, the visit may have to be switched to a telephone visit. With either a video or telephone visit, we are not always able to ensure that we have a secure connection.  By engaging in this virtual visit, you consent to the provision of healthcare and authorize for your insurance to be billed (if applicable) for the services provided during this visit. Depending on your insurance coverage, you may receive a charge related to this service.  I need to obtain your verbal consent now. Are you willing to proceed with your visit today? Caroline Ingram has provided verbal consent on 08/23/2022 for a virtual visit (video or telephone). Piedad Climes, [place]  Date: 08/23/2022 8:12 AM  Virtual Visit via Video Note   I, Piedad Climes, connected with  Caroline Ingram  ([id number], 11-27-1985) on 08/23/22 at  8:00 AM EDT by a video-enabled telemedicine application and verified that I am speaking with the correct person using two identifiers.  Location: Patient: Virtual Visit  Location Patient: Home Provider: Virtual Visit Location Provider: Home Office   I discussed the limitations of evaluation and management by telemedicine and the availability of in person appointments. The patient expressed understanding and agreed to proceed.    History of Present Illness: Caroline Ingram is a 37 y.o. who identifies as a female who was assigned female at birth, and is being seen today for acute on chronic R leg pain that she has dealt with over the past few years. Notes always with some discomfort she can feel in R gluteal region with occasional radiation down the back of leg into foot. Over past few days this has worsened with more consistent pain down the leg into foot with occasional tingling. Denies numbness or extremity or saddle anesthesia. Denies trauma or injury. Notes that standing, siting or laying make pain worse. Has taken some leftover Tramadol 50 mg without any improvement in pain. Is currently without a PCP. Has not see a specialist for this.   Patient also with history of moderate persistent asthma, on Dulera and albuterol but out of both medications after a recent move. Is requesting refills.    HPI: HPI  Problems:  Patient Active Problem List   Diagnosis Date Noted   Attention deficit hyperactivity disorder (ADHD), predominantly inattentive type 09/11/2020   Major depressive disorder, recurrent episode, moderate with anxious distress (HCC) 07/31/2020   Grief 06/08/2020   PTSD (post-traumatic stress disorder) 06/08/2020   Preterm labor in third trimester 05/16/2020   Vaginal delivery 05/16/2020  Threatened preterm labor, second trimester 05/11/2020   COVID-19 affecting pregnancy in second trimester 05/11/2020   Pregnancy affected by fetal growth restriction 05/08/2020   Cervical insufficiency during pregnancy, antepartum 05/08/2020   Essential hypertension 05/08/2020   Mixed hyperlipidemia 05/08/2020   Moderate persistent asthma, uncomplicated  05/08/2020   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 04/23/2020   Supervision of high risk pregnancy, antepartum 01/13/2020   Psychosocial stressors 04/10/2019   Obesity in pregnancy 03/27/2019   Unwanted fertility 03/27/2019   History of preterm delivery, currently pregnant 11/05/2018   Chronic hypertension affecting pregnancy 11/05/2018   Nexplanon in place 11/05/2018   Marijuana abuse 11/05/2018   Tobacco use disorder 11/05/2018   Bipolar 2 disorder, major depressive episode (HCC) 11/14/2017   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 11/14/2017   DEPRESSION 01/13/2009   Asthma 01/13/2009    Allergies:  Allergies  Allergen Reactions   Chocolate Anaphylaxis   Shrimp [Shellfish Allergy] Anaphylaxis   Iodine Itching   Medications:  Current Outpatient Medications:    cyclobenzaprine (FLEXERIL) 10 MG tablet, Take 1 tablet (10 mg total) by mouth 3 (three) times daily as needed for muscle spasms., Disp: 30 tablet, Rfl: 0   predniSONE (STERAPRED UNI-PAK 21 TAB) 10 MG (21) TBPK tablet, Take following package directions, Disp: 21 tablet, Rfl: 0   albuterol (VENTOLIN HFA) 108 (90 Base) MCG/ACT inhaler, Inhale 2 puffs into the lungs every 6 (six) hours as needed for wheezing or shortness of breath., Disp: 8 g, Rfl: 0   amLODipine (NORVASC) 5 MG tablet, TAKE 1 TABLET (5 MG TOTAL) BY MOUTH DAILY. (Patient not taking: Reported on 12/22/2020), Disp: 45 tablet, Rfl: 1   ARIPiprazole ER (ABILIFY MAINTENA) 400 MG PRSY prefilled syringe, Inject 400 mg into the muscle every 28 (twenty-eight) days., Disp: 1 each, Rfl: 12   coconut oil OIL, Apply 1 application topically as needed (nipple pain). (Patient not taking: Reported on 12/22/2020), Disp: , Rfl: 0   hydrOXYzine (ATARAX) 10 MG tablet, Take 1 tablet (10 mg total) by mouth 3 (three) times daily as needed., Disp: 30 tablet, Rfl: 0   ibuprofen (ADVIL) 600 MG tablet, Take 1 tablet (600 mg total) by mouth every 6 (six) hours as  needed for moderate pain or cramping. (Patient not taking: Reported on 12/22/2020), Disp: 30 tablet, Rfl: 0   mometasone-formoterol (DULERA) 100-5 MCG/ACT AERO, Inhale 2 puffs into the lungs 2 (two) times daily as needed for wheezing or shortness of breath., Disp: 1 each, Rfl: 0   naproxen (NAPROSYN) 500 MG tablet, Take 1 tablet (500 mg total) by mouth 2 (two) times daily with a meal., Disp: 30 tablet, Rfl: 0   ondansetron (ZOFRAN-ODT) 4 MG disintegrating tablet, Take 1 tablet (4 mg total) by mouth every 8 (eight) hours as needed for nausea or vomiting., Disp: 10 tablet, Rfl: 0   Prenatal Vit-Fe Fumarate-FA (PRENATAL MULTIVITAMIN) TABS tablet, Take 1 tablet by mouth daily at 12 noon. (Patient not taking: Reported on 12/22/2020), Disp: , Rfl:    Pseudoeph-Doxylamine-DM-APAP (NYQUIL PO), Take 1 Dose by mouth at bedtime as needed (cough/cold). (Patient not taking: Reported on 12/22/2020), Disp: , Rfl:    sertraline (ZOLOFT) 100 MG tablet, Take 2 tablets (200 mg total) by mouth daily., Disp: 60 tablet, Rfl: 0   traMADol (ULTRAM) 50 MG tablet, Take 1 tablet (50 mg total) by mouth every 6 (six) hours as needed., Disp: 12 tablet, Rfl: 0   traZODone (DESYREL) 50 MG tablet, Take 1 tablet (50 mg  total) by mouth at bedtime as needed for sleep., Disp: 30 tablet, Rfl: 2  Observations/Objective: Patient is well-developed, well-nourished in no acute distress.  Resting comfortably  at home.  Head is normocephalic, atraumatic.  No labored breathing.  Speech is clear and coherent with logical content.  Patient is alert and oriented at baseline.   Assessment and Plan: 1. Moderate persistent asthma, uncomplicated - albuterol (VENTOLIN HFA) 108 (90 Base) MCG/ACT inhaler; Inhale 2 puffs into the lungs every 6 (six) hours as needed for wheezing or shortness of breath.  Dispense: 8 g; Refill: 0 - mometasone-formoterol (DULERA) 100-5 MCG/ACT AERO; Inhale 2 puffs into the lungs 2 (two) times daily as needed for wheezing or  shortness of breath.  Dispense: 1 each; Refill: 0  Medications refilled. Resources given to patient to help her establish with new PCP.  2. Sciatica of right side - predniSONE (STERAPRED UNI-PAK 21 TAB) 10 MG (21) TBPK tablet; Take following package directions  Dispense: 21 tablet; Refill: 0 - cyclobenzaprine (FLEXERIL) 10 MG tablet; Take 1 tablet (10 mg total) by mouth 3 (three) times daily as needed for muscle spasms.  Dispense: 30 tablet; Refill: 0  Needs further evaluation giving chronicity. No alarm signs/symptoms present. Will start Prednisone pack and Flexeril. Resources given for same-day ortho/SM clinics for her to get an in-person evaluation and avoid ER.   Follow Up Instructions: I discussed the assessment and treatment plan with the patient. The patient was provided an opportunity to ask questions and all were answered. The patient agreed with the plan and demonstrated an understanding of the instructions.  A copy of instructions were sent to the patient via MyChart unless otherwise noted below.   The patient was advised to call back or seek an in-person evaluation if the symptoms worsen or if the condition fails to improve as anticipated.  Time:  I spent 12 minutes with the patient via telehealth technology discussing the above problems/concerns.    Piedad Climes, PA-C

## 2022-10-17 ENCOUNTER — Telehealth: Payer: Self-pay

## 2022-12-08 IMAGING — CT CT CHEST W/O CM
2 of 3 series · 15 of 36 positions shown, 18 images · non-contrast
Comparison: Radiograph 08/18/2020

CLINICAL DATA: Cough, shortness of breath, abdominal pain for 1
week

EXAM:
CT CHEST WITHOUT CONTRAST
TECHNIQUE: Multidetector CT imaging of the chest was performed following the
standard protocol without IV contrast.

[Series 3: chest w/o 2mm st · axial · non-contrast · 0.85mm/px · z∈[-300,-46]mm · 12 of 149 slices shown, 15 images]
[im 11/149  mediastinal]
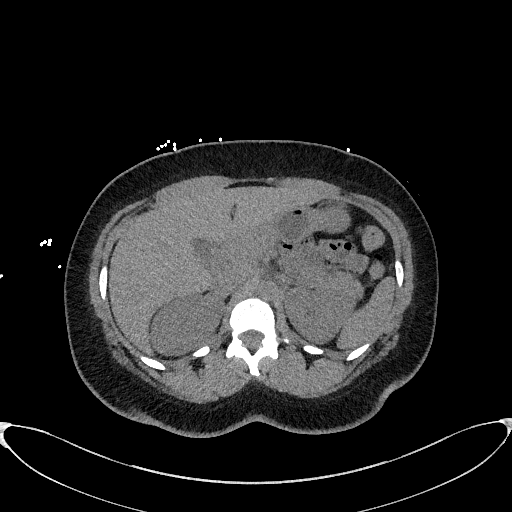
[im 11/149  lung]
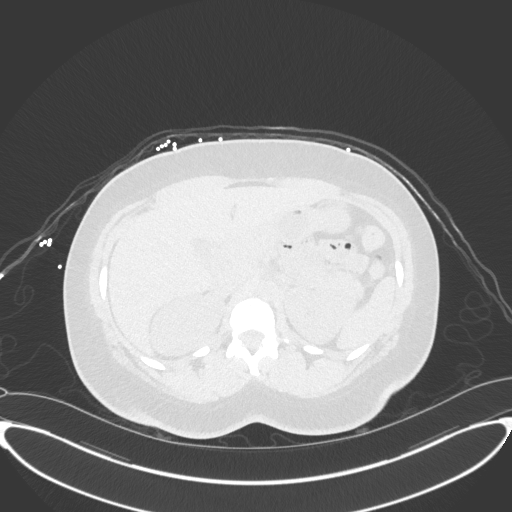
[im 22/149  lung]
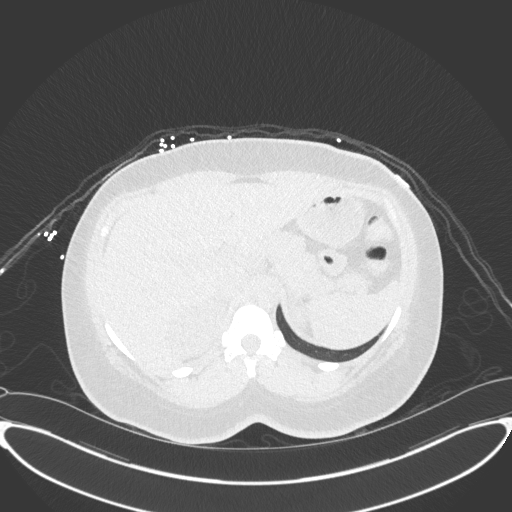
[im 33/149  lung]
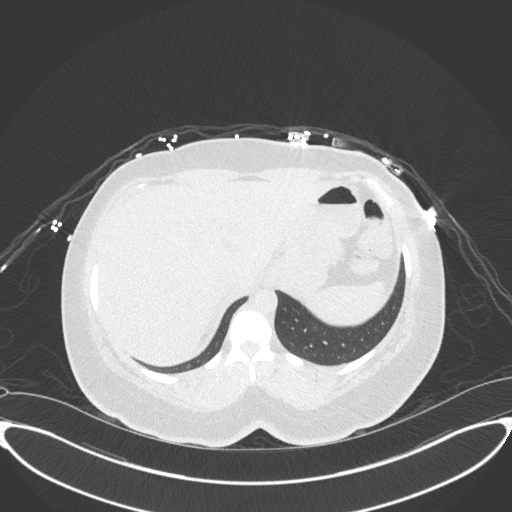
[im 44/149  lung]
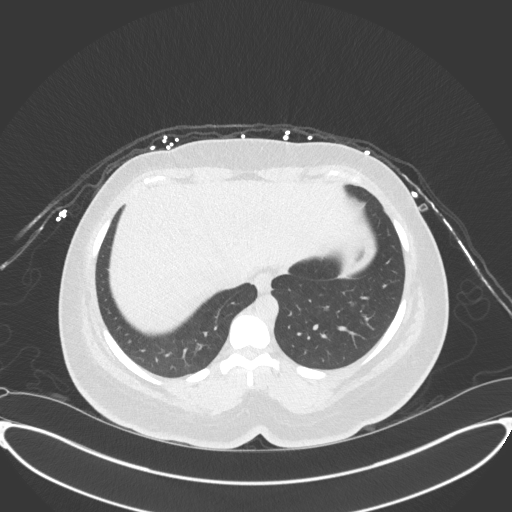
[im 55/149  mediastinal]
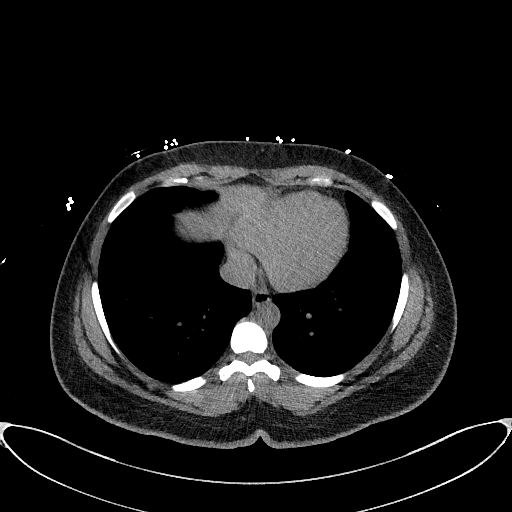
[im 55/149  lung]
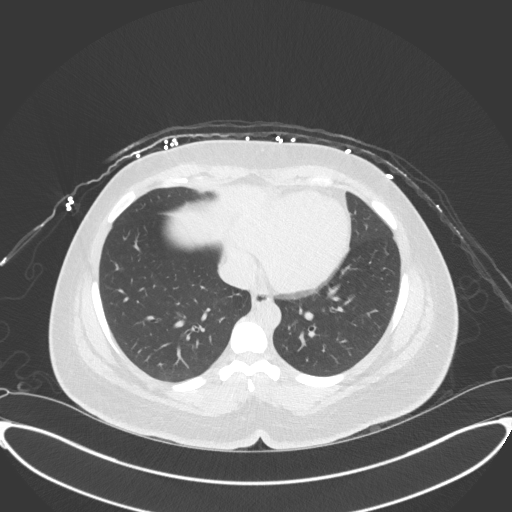
[im 66/149  lung]
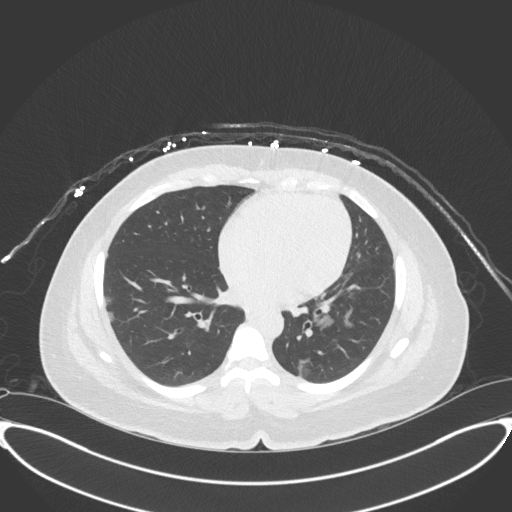
[im 83/149  lung]
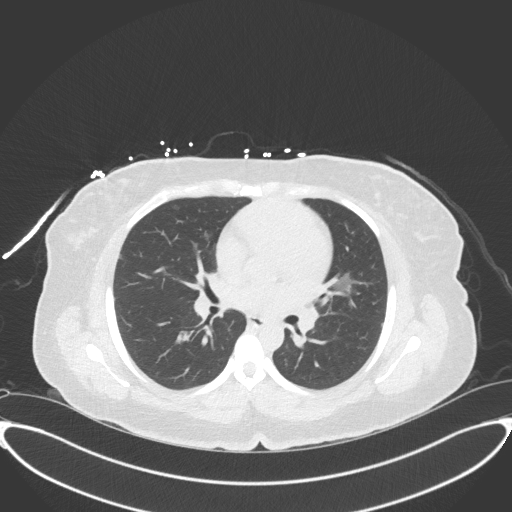
[im 94/149  lung]
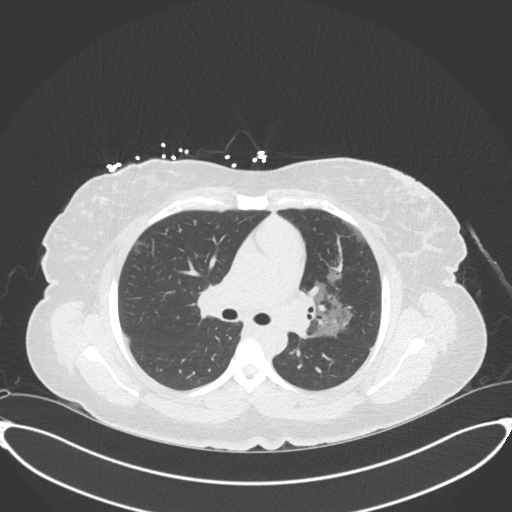
[im 105/149  mediastinal]
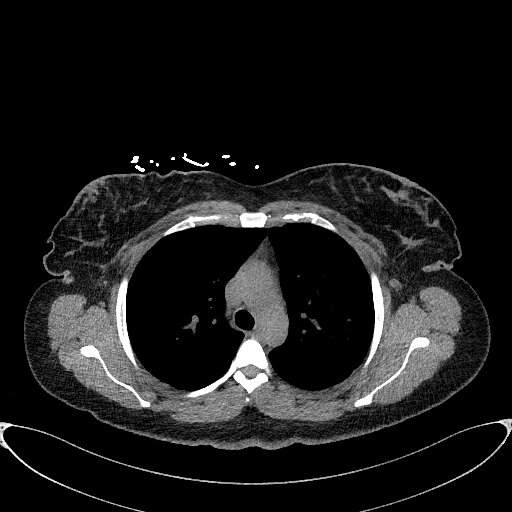
[im 105/149  lung]
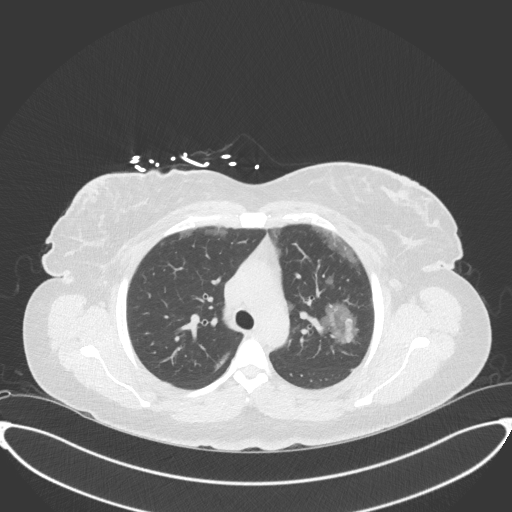
[im 116/149  lung]
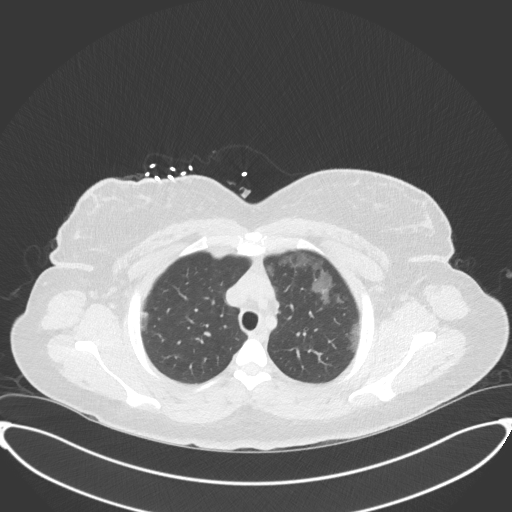
[im 127/149  lung]
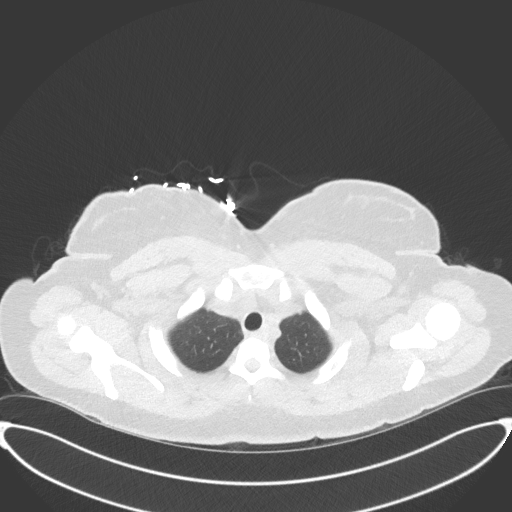
[im 138/149  lung]
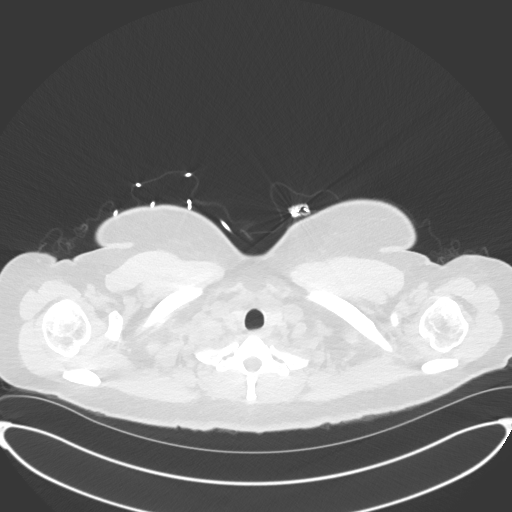

[Series 5: chest w/o 2mm st cor · coronal · non-contrast · 0.64mm/px · 3 of 151 slices shown]
[im 31/151  lung]
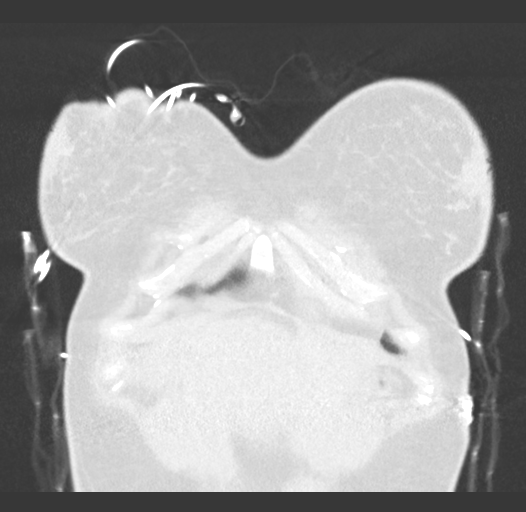
[im 61/151  lung]
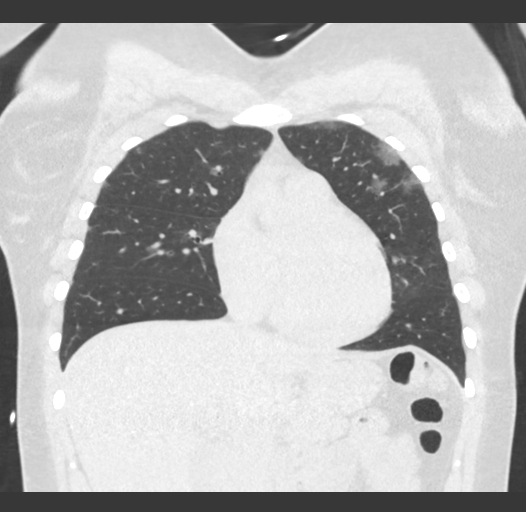
[im 91/151  lung]
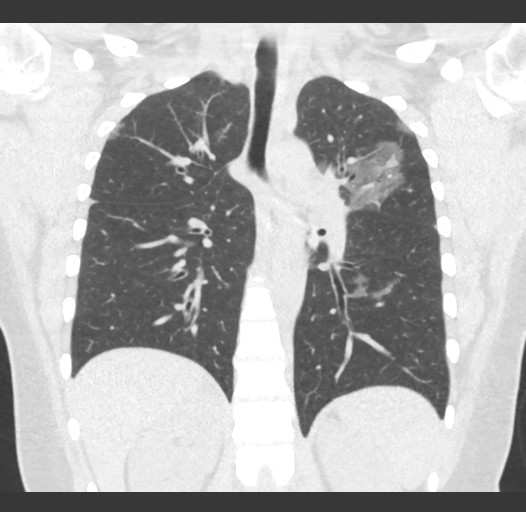

[15 of 36 positions shown; findings below may reference images not displayed]

FINDINGS: Cardiovascular: Normal heart size. No pericardial effusion. The
normal caliber thoracic aorta. Normal 3 vessel branching of the
aortic arch. Central pulmonary arteries are normal caliber. No major
venous abnormalities. Assessment of the vasculature is limited in
the absence of contrast media.

Mediastinum/Nodes: Fatty stippling in the anterior mediastinum may
reflect thymic remnant in a patient of this age, and in the absence
of aggressive features or traumatic findings. No mediastinal fluid
or gas. Normal thyroid gland and thoracic inlet. No acute
abnormality of the trachea or esophagus. No worrisome mediastinal or
axillary adenopathy. Hilar nodal evaluation is limited in the
absence of intravenous contrast media.

Lungs/Pleura: There are multifocal areas of ground-glass and early
consolidative opacity in both lungs with a slight upper lobe and
peripheral predominance. No pneumothorax or visible effusion. No CT
evidence of edema. Minimal airways thickening. No concerning
pulmonary nodules or masses.

Upper Abdomen: No acute abnormalities present in the visualized
portions of the upper abdomen.

Musculoskeletal: No acute osseous abnormality or suspicious osseous
lesion.
IMPRESSION: Multifocal areas of mixed consolidative and ground-glass opacity
concerning for a multifocal pneumonia including atypical etiologies
such as [id number].

## 2022-12-08 IMAGING — DX DG CHEST 1V PORT
1 series · 1 of 1 positions shown · non-contrast
Comparison: April 02, 2018

CLINICAL DATA: Cough with shortness of breath and abdominal pain.

EXAM:
PORTABLE CHEST 1 VIEW

[chest ap]
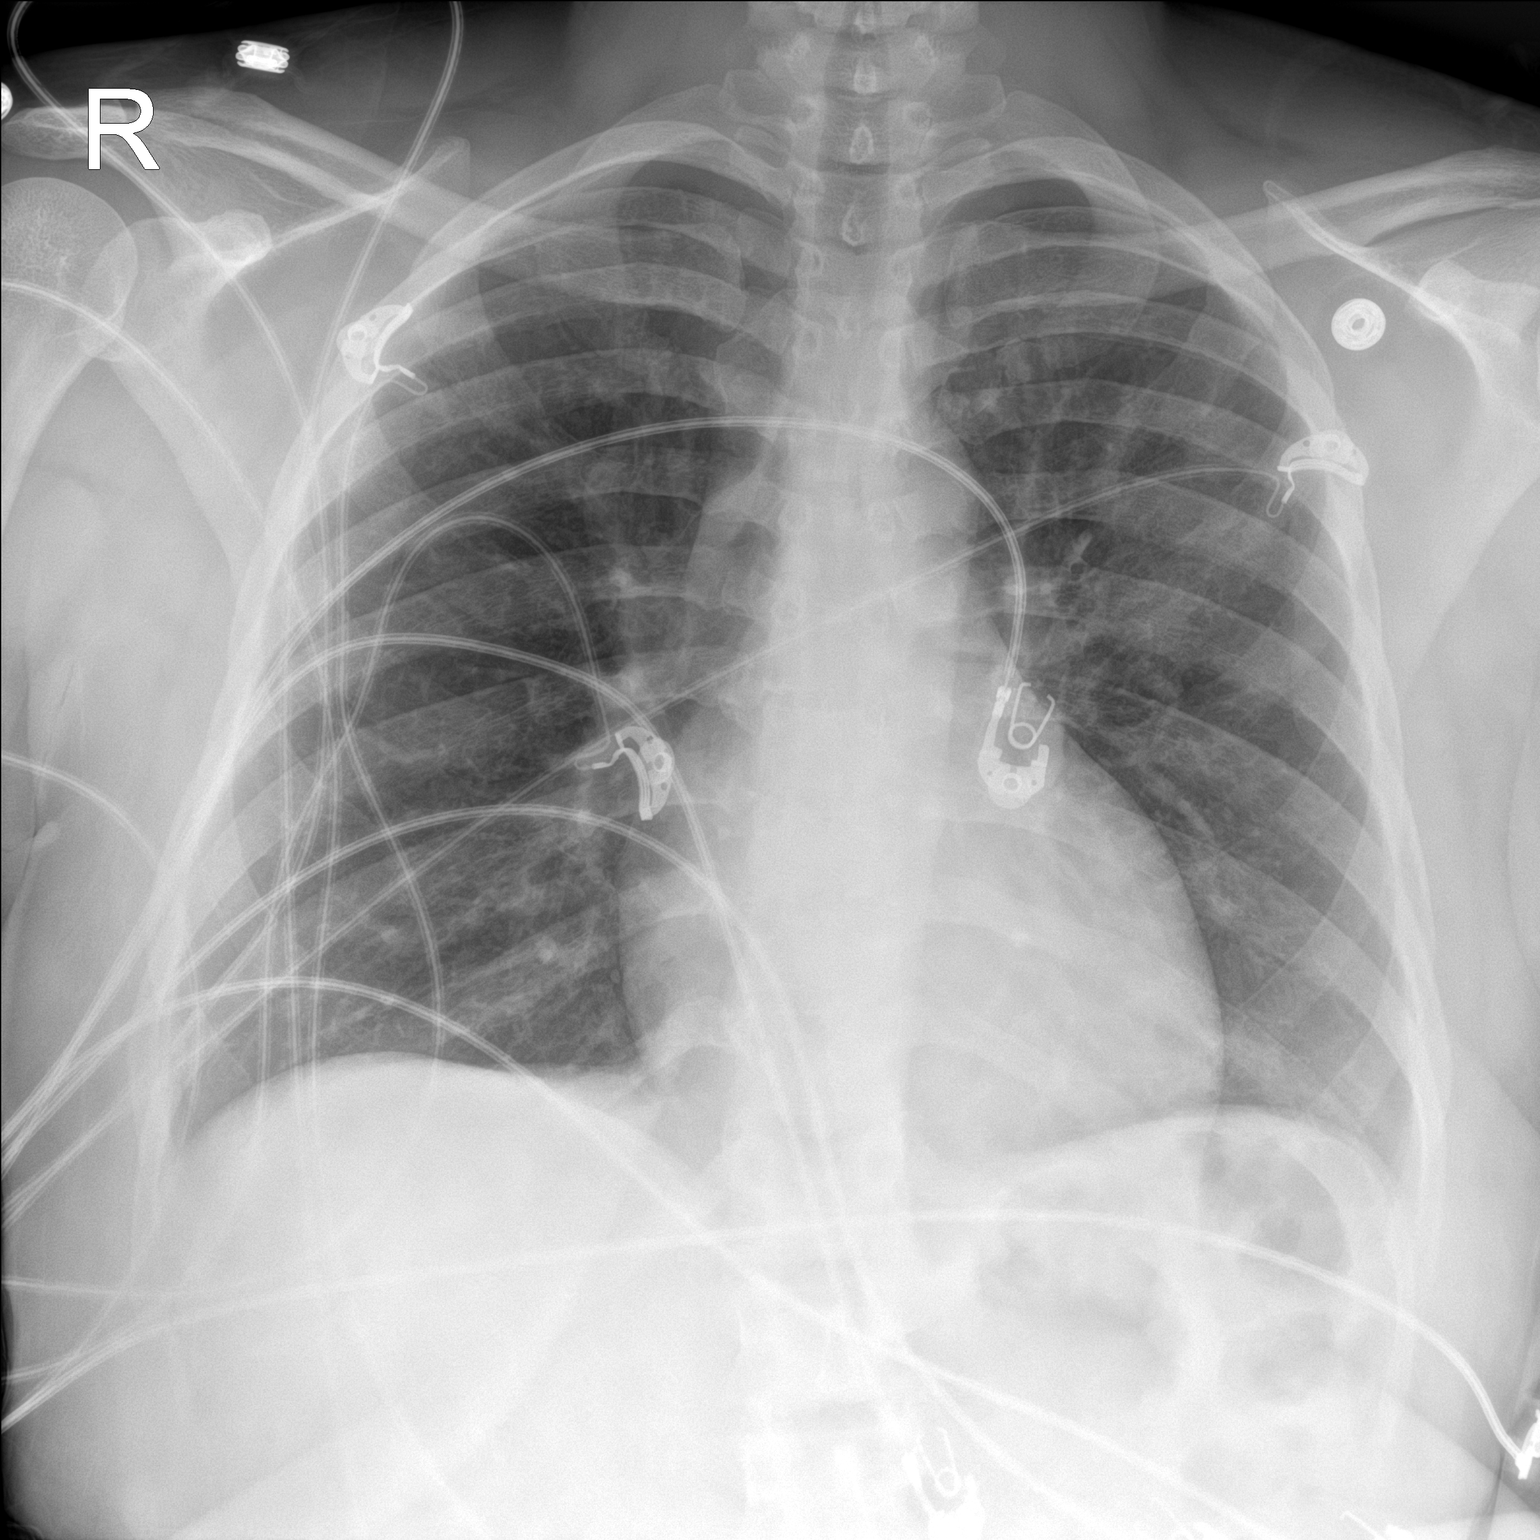

[1 of 1 positions shown; findings below may reference images not displayed]

FINDINGS: Multiple small hazy areas of mildly increased opacification are seen
overlying both lungs. There is no evidence of a pleural effusion or
pneumothorax. The heart size and mediastinal contours are within
normal limits. The visualized skeletal structures are unremarkable.
IMPRESSION: Findings which may represent ill-defined multifocal areas of
atelectasis and/or infiltrate. Correlation with chest CT is
recommended.

## 2023-01-31 ENCOUNTER — Encounter (HOSPITAL_COMMUNITY): Payer: Self-pay

## 2023-01-31 ENCOUNTER — Emergency Department (HOSPITAL_COMMUNITY): Payer: MEDICAID

## 2023-01-31 ENCOUNTER — Emergency Department (HOSPITAL_COMMUNITY)
Admission: EM | Admit: 2023-01-31 | Discharge: 2023-01-31 | Disposition: A | Discharge status: Home / Self Care | Payer: MEDICAID | Attending: Emergency Medicine | Admitting: Emergency Medicine

## 2023-01-31 ENCOUNTER — Other Ambulatory Visit: Payer: Self-pay

## 2023-01-31 DIAGNOSIS — Z79899 Other long term (current) drug therapy: Secondary | ICD-10-CM | POA: Insufficient documentation

## 2023-01-31 DIAGNOSIS — I10 Essential (primary) hypertension: Secondary | ICD-10-CM | POA: Diagnosis not present

## 2023-01-31 DIAGNOSIS — R079 Chest pain, unspecified: Secondary | ICD-10-CM | POA: Diagnosis present

## 2023-01-31 LAB — BASIC METABOLIC PANEL
Anion gap: 10 (ref 5–15)
BUN: 8 mg/dL (ref 6–20)
CO2: 22 mmol/L (ref 22–32)
Calcium: 8.5 mg/dL — ABNORMAL LOW (ref 8.9–10.3)
Chloride: 106 mmol/L (ref 98–111)
Creatinine, Ser: 0.9 mg/dL (ref 0.44–1.00)
GFR, Estimated: >60 mL/min (ref >60)
Glucose, Bld: 102 mg/dL — ABNORMAL HIGH (ref 70–99)
Potassium: 3.8 mmol/L (ref 3.5–5.1)
Sodium: 138 mmol/L (ref 135–145)

## 2023-01-31 LAB — CBC
HCT: 35.8 % — ABNORMAL LOW (ref 36.0–46.0)
Hemoglobin: 11.5 g/dL — ABNORMAL LOW (ref 12.0–15.0)
MCH: 26.8 pg (ref 26.0–34.0)
MCHC: 32.1 g/dL (ref 30.0–36.0)
MCV: 83.4 fL (ref 80.0–100.0)
Platelets: 238 10*3/uL (ref 150–400)
RBC: 4.29 MIL/uL (ref 3.87–5.11)
RDW: 15.4 % (ref 11.5–15.5)
WBC: 5.8 10*3/uL (ref 4.0–10.5)
nRBC: 0 % (ref 0.0–0.2)

## 2023-01-31 LAB — TROPONIN I (HIGH SENSITIVITY)
Troponin I (High Sensitivity): 3 ng/L (ref <18)
Troponin I (High Sensitivity): 4 ng/L (ref <18)

## 2023-01-31 NOTE — ED Triage Notes (Signed)
Patient c/o "pins and needles" feeling in central chest. Patient reports no relief with eating or position changes and did not take any meds. 8/10 intensity reported.

## 2023-01-31 NOTE — ED Provider Notes (Signed)
[place] EMERGENCY DEPARTMENT AT [facility] Provider Note   CSN: [id number] Arrival date & time: 01/31/23  1421    History  Chief Complaint  Patient presents with   Chest Pain    Caroline Ingram is a 37 y.o. female history of hypertension, palpitations here for evaluation of abnormal sensation to the center of her chest.  Describes it as a "pins-and-needles" sensation.  She has no symptoms to her back, face, upper or lower extremities.  Denies any overt pain.  No obvious rashes to her chest.  History of shingles.  Feels different than her prior chest pain.  No fever, cough, back pain, abdominal pain, pain or swelling to lower legs.  No history of PE or DVT.  No recent surgery, immobilization or malignancy. No sick contacts. Sx are not exertional, pleuritic in nature or worse with food intake she denies any reflux or GERD type symptoms. Denies chance of pregnancy. Sx started 3 hours PTA.  HPI     Home Medications Prior to Admission medications   Medication Sig Start Date End Date Taking? Authorizing Provider  albuterol (VENTOLIN HFA) 108 (90 Base) MCG/ACT inhaler Inhale 2 puffs into the lungs every 6 (six) hours as needed for wheezing or shortness of breath. 08/23/22   Waldon Merl, PA-C  amLODipine (NORVASC) 5 MG tablet TAKE 1 TABLET (5 MG TOTAL) BY MOUTH DAILY. Patient not taking: Reported on 12/22/2020 05/18/20 05/18/21  Shirlean Mylar, MD  ARIPiprazole ER (ABILIFY MAINTENA) 400 MG PRSY prefilled syringe Inject 400 mg into the muscle every 28 (twenty-eight) days. 12/22/20   Lenard Lance, FNP  coconut oil OIL Apply 1 application topically as needed (nipple pain). Patient not taking: Reported on 12/22/2020 05/18/20   Shirlean Mylar, MD  cyclobenzaprine (FLEXERIL) 10 MG tablet Take 1 tablet (10 mg total) by mouth 3 (three) times daily as needed for muscle spasms. 08/23/22   Waldon Merl, PA-C  hydrOXYzine (ATARAX) 10 MG tablet Take 1 tablet (10 mg total) by mouth 3  (three) times daily as needed. 05/01/22   Orvil Feil, PA-C  ibuprofen (ADVIL) 600 MG tablet Take 1 tablet (600 mg total) by mouth every 6 (six) hours as needed for moderate pain or cramping. Patient not taking: Reported on 12/22/2020 05/21/20   Reva Bores, MD  mometasone-formoterol [facility]) 100-5 MCG/ACT AERO Inhale 2 puffs into the lungs 2 (two) times daily as needed for wheezing or shortness of breath. 08/23/22   Waldon Merl, PA-C  naproxen (NAPROSYN) 500 MG tablet Take 1 tablet (500 mg total) by mouth 2 (two) times daily with a meal. 07/10/22   Hawks, Neysa Bonito A, FNP  ondansetron (ZOFRAN-ODT) 4 MG disintegrating tablet Take 1 tablet (4 mg total) by mouth every 8 (eight) hours as needed for nausea or vomiting. 06/02/22   Roxy Horseman, PA-C  predniSONE (STERAPRED UNI-PAK 21 TAB) 10 MG (21) TBPK tablet Take following package directions 08/23/22   Waldon Merl, PA-C  Prenatal Vit-Fe Fumarate-FA (PRENATAL MULTIVITAMIN) TABS tablet Take 1 tablet by mouth daily at 12 noon. Patient not taking: Reported on 12/22/2020    [provider]  Pseudoeph-Doxylamine-DM-APAP (NYQUIL PO) Take 1 Dose by mouth at bedtime as needed (cough/cold). Patient not taking: Reported on 12/22/2020    [provider]  sertraline (ZOLOFT) 100 MG tablet Take 2 tablets (200 mg total) by mouth daily. 05/01/22   Orvil Feil, PA-C  traMADol (ULTRAM) 50 MG tablet Take 1 tablet (50 mg total) by  mouth every 6 (six) hours as needed. 07/28/22   Triplett, Rulon Eisenmenger B, FNP  traZODone (DESYREL) 50 MG tablet Take 1 tablet (50 mg total) by mouth at bedtime as needed for sleep. 05/01/22   Orvil Feil, PA-C      Allergies    Chocolate, Shrimp [shellfish allergy], and Iodine    Review of Systems   Review of Systems  Constitutional: Negative.   HENT: Negative.    Respiratory: Negative.    Cardiovascular: Negative.   Gastrointestinal: Negative.   Genitourinary: Negative.   Musculoskeletal: Negative.   Skin:  Negative.   Neurological: Negative.   All other systems reviewed and are negative.   Physical Exam Updated Vital Signs BP (!) 170/89 (BP Location: Right Arm)   Pulse (!) 58   Temp 98.4 F (36.9 C)   Resp 20   Ht 5\' 4"  (1.626 m)   Wt 93.4 kg   SpO2 100%   BMI 35.36 kg/m  Physical Exam Vitals and nursing note reviewed.  Constitutional:      General: She is not in acute distress.    Appearance: She is well-developed. She is not ill-appearing, toxic-appearing or diaphoretic.  HENT:     Head: Atraumatic.  Eyes:     Pupils: Pupils are equal, round, and reactive to light.  Cardiovascular:     Rate and Rhythm: Normal rate.     Pulses:          Radial pulses are 2+ on the right side and 2+ on the left side.       Dorsalis pedis pulses are 2+ on the right side and 2+ on the left side.     Heart sounds: Normal heart sounds.  Pulmonary:     Effort: Pulmonary effort is normal. No respiratory distress.     Breath sounds: Normal breath sounds.     Comments: Clear bilaterally, speaks in full sentences without difficulty Chest:     Comments: No obvious rashes or lesions to chest wall.  No crepitus, step-off Abdominal:     General: Bowel sounds are normal. There is no distension or abdominal bruit.     Palpations: Abdomen is soft. There is no hepatomegaly or mass.     Tenderness: There is no abdominal tenderness. There is no guarding or rebound.     Comments: Soft, nontender, no rebound or guarding  Musculoskeletal:        General: Normal range of motion.     Cervical back: Normal range of motion.     Right lower leg: No tenderness. No edema.     Left lower leg: No tenderness. No edema.     Comments: No bony tenderness, compartments soft, nontender calves bilaterally.  Moves all 4 extremities without difficulty  Skin:    General: Skin is warm and dry.     Comments: No edema, erythema, warmth, rashes, lesions.  Neurological:     General: No focal deficit present.     Mental Status:  She is alert.     Comments: Equal strength Intact sensation CN 2-12 grossly intact  Psychiatric:        Mood and Affect: Mood normal.     ED Results / Procedures / Treatments   Labs (all labs ordered are listed, but only abnormal results are displayed) Labs Reviewed  BASIC METABOLIC PANEL - Abnormal; Notable for the following components:      Result Value   Glucose, Bld 102 (*)    Calcium 8.5 (*)  All other components within normal limits  CBC - Abnormal; Notable for the following components:   Hemoglobin 11.5 (*)    HCT 35.8 (*)    All other components within normal limits  TROPONIN I (HIGH SENSITIVITY)  TROPONIN I (HIGH SENSITIVITY)    EKG None  Radiology DG Chest 2 View  Result Date: 01/31/2023 CLINICAL DATA:  Chest pain. EXAM: CHEST - 2 VIEW COMPARISON:  Radiograph 05/01/2022.  CT 08/18/2020 FINDINGS: The cardiomediastinal contours are normal. The lungs are clear. Pulmonary vasculature is normal. No consolidation, pleural effusion, or pneumothorax. No acute osseous abnormalities are seen. IMPRESSION: Negative radiographs of the chest. Electronically Signed   By: Narda Rutherford M.D.   On: 01/31/2023 17:00    Procedures Procedures    Medications Ordered in ED Medications - No data to display  ED Course/ Medical Decision Making/ A&P   37 year old here for evaluation of pins-and-needles sensation to the center of her chest.  Does not radiate.  Nonexertional, nonpleuritic in nature.  She has no wheeze rashes or lesions to her chest.  No numbness or weakness or altered sensation to her extremities.  No clinical evidence of VTE on exam she is PERC negative.  Will plan on labs, imaging and reassess  Labs and imaging personally viewed and interpreted:  EKG without ischemic changes Chest x-ray without pneumonia, pneumothorax, cardiomegaly, pulm edema Delta troponin flat CBC hemoglobin 11.5 Metabolic panel without significant abnormality  Patient reassessed.  We  discussed her labs and imaging.  Unclear source of her pain, at this time I have low suspicion for acute ACS, PE, dissection, pneumothorax, bacterial infection, fluid overload, myocarditis, pericarditis, shingles, cellulitis, abscess, intraabdominal etiology.  Will have her FU outpatient, return for new or worsening symptoms  The patient has been appropriately medically screened and/or stabilized in the ED. I have low suspicion for any other emergent medical condition which would require further screening, evaluation or treatment in the ED or require inpatient management.  Patient is hemodynamically stable and in no acute distress.  Patient able to ambulate in department prior to ED.  Evaluation does not show acute pathology that would require ongoing or additional emergent interventions while in the emergency department or further inpatient treatment.  I have discussed the diagnosis with the patient and answered all questions.  Pain is been managed while in the emergency department and patient has no further complaints prior to discharge.  Patient is comfortable with plan discussed in room and is stable for discharge at this time.  I have discussed strict return precautions for returning to the emergency department.  Patient was encouraged to follow-up with PCP/specialist refer to at discharge.                                 Medical Decision Making Amount and/or Complexity of Data Reviewed External Data Reviewed: labs, radiology, ECG and notes. Labs: ordered. Decision-making details documented in ED Course. Radiology: ordered and independent interpretation performed. Decision-making details documented in ED Course. ECG/medicine tests: ordered and independent interpretation performed. Decision-making details documented in ED Course.  Risk OTC drugs. Decision regarding hospitalization. Diagnosis or treatment significantly limited by social determinants of health.          Final Clinical  Impression(s) / ED Diagnoses Final diagnoses:  Chest pain, unspecified type    Rx / DC Orders ED Discharge Orders     None  Tyreak Reagle A, PA-C 01/31/23 2315    Melene Plan, DO 01/31/23 2322

## 2023-01-31 NOTE — Discharge Instructions (Signed)
Make sure to follow-up outpatient  Return for new or worsening symptoms

## 2023-03-23 ENCOUNTER — Other Ambulatory Visit: Payer: Self-pay

## 2023-03-23 ENCOUNTER — Emergency Department
Admission: EM | Admit: 2023-03-23 | Discharge: 2023-03-24 | Disposition: A | Discharge status: Home / Self Care | Payer: MEDICAID | Attending: Emergency Medicine | Admitting: Emergency Medicine

## 2023-03-23 DIAGNOSIS — Z1152 Encounter for screening for COVID-19: Secondary | ICD-10-CM | POA: Diagnosis not present

## 2023-03-23 DIAGNOSIS — R42 Dizziness and giddiness: Secondary | ICD-10-CM | POA: Insufficient documentation

## 2023-03-23 DIAGNOSIS — Z87891 Personal history of nicotine dependence: Secondary | ICD-10-CM | POA: Diagnosis not present

## 2023-03-23 DIAGNOSIS — J45909 Unspecified asthma, uncomplicated: Secondary | ICD-10-CM | POA: Insufficient documentation

## 2023-03-23 LAB — CBC
HCT: 35 % — ABNORMAL LOW (ref 36.0–46.0)
Hemoglobin: 11.5 g/dL — ABNORMAL LOW (ref 12.0–15.0)
MCH: 27.1 pg (ref 26.0–34.0)
MCHC: 32.9 g/dL (ref 30.0–36.0)
MCV: 82.5 fL (ref 80.0–100.0)
Platelets: 246 10*3/uL (ref 150–400)
RBC: 4.24 MIL/uL (ref 3.87–5.11)
RDW: 14.6 % (ref 11.5–15.5)
WBC: 6.2 10*3/uL (ref 4.0–10.5)
nRBC: 0 % (ref 0.0–0.2)

## 2023-03-23 LAB — URINALYSIS, ROUTINE W REFLEX MICROSCOPIC
Bacteria, UA: NONE SEEN
Bilirubin Urine: NEGATIVE
Glucose, UA: NEGATIVE mg/dL
Ketones, ur: NEGATIVE mg/dL
Leukocytes,Ua: NEGATIVE
Nitrite: NEGATIVE
Protein, ur: NEGATIVE mg/dL
RBC / HPF: >50 RBC/hpf (ref 0–5)
Specific Gravity, Urine: 1.02 (ref 1.005–1.030)
pH: 6 (ref 5.0–8.0)

## 2023-03-23 LAB — COMPREHENSIVE METABOLIC PANEL
ALT: 11 U/L (ref 0–44)
AST: 15 U/L (ref 15–41)
Albumin: 3.4 g/dL — ABNORMAL LOW (ref 3.5–5.0)
Alkaline Phosphatase: 55 U/L (ref 38–126)
Anion gap: 6 (ref 5–15)
BUN: 11 mg/dL (ref 6–20)
CO2: 25 mmol/L (ref 22–32)
Calcium: 8 mg/dL — ABNORMAL LOW (ref 8.9–10.3)
Chloride: 105 mmol/L (ref 98–111)
Creatinine, Ser: 0.83 mg/dL (ref 0.44–1.00)
GFR, Estimated: >60 mL/min (ref >60)
Glucose, Bld: 78 mg/dL (ref 70–99)
Potassium: 3.4 mmol/L — ABNORMAL LOW (ref 3.5–5.1)
Sodium: 136 mmol/L (ref 135–145)
Total Bilirubin: 0.5 mg/dL (ref <1.2)
Total Protein: 6.2 g/dL — ABNORMAL LOW (ref 6.5–8.1)

## 2023-03-23 LAB — RESP PANEL BY RT-PCR (RSV, FLU A&B, COVID)  RVPGX2
Influenza A by PCR: NEGATIVE
Influenza B by PCR: NEGATIVE
Resp Syncytial Virus by PCR: NEGATIVE
SARS Coronavirus 2 by RT PCR: NEGATIVE

## 2023-03-23 LAB — CBG MONITORING, ED: Glucose-Capillary: 78 mg/dL (ref 70–99)

## 2023-03-23 LAB — POC URINE PREG, ED: Preg Test, Ur: NEGATIVE

## 2023-03-23 NOTE — ED Triage Notes (Signed)
Pt reports fatigue with "lightheadedness" not dizziness x 1 day. Denies any fall or head injury. Denies fever, cough, congestion, chills, body aches. Denies sore throat or vision change. Denies chest pain or pressure or SOB. Pt ambulatory to triage. Alert and oriented, breathing unlabored, speaking in full sentences.

## 2023-03-24 NOTE — ED Provider Notes (Signed)
[facility] Provider Note    Event Date/Time   First MD Initiated Contact with Patient 03/23/23 2339     (approximate)   History   Fatigue and Dizziness   HPI  Caroline Ingram is a 37 y.o. female who presents to the ED for evaluation of Fatigue and Dizziness   Review of video PCP visit from May.  Obese patient with history of PTSD and bipolar, sciatica, asthma and cigarette smoking.  Patient presents for evaluation of lightheadedness throughout the day today after starting her menstrual cycle.  No syncope or falls.  No fevers.  Typical cramping without further abdominal pain.  No emesis or diarrhea.  She is tolerating p.o. intake at baseline.   Physical Exam   Triage Vital Signs: ED Triage Vitals  Encounter Vitals Group     BP 03/23/23 1911 (!) 149/87     Systolic BP Percentile --      Diastolic BP Percentile --      Pulse Rate 03/23/23 1911 64     Resp 03/23/23 1911 18     Temp 03/23/23 1911 98.2 F (36.8 C)     Temp Source 03/23/23 1911 Oral     SpO2 03/23/23 1911 99 %     Weight 03/23/23 1909 200 lb (90.7 kg)     Height 03/23/23 1909 5\' 4"  (1.626 m)     Head Circumference --      Peak Flow --      Pain Score 03/23/23 1909 0     Pain Loc --      Pain Education --      Exclude from Growth Chart --     Most recent vital signs: Vitals:   03/23/23 1911  BP: (!) 149/87  Pulse: 64  Resp: 18  Temp: 98.2 F (36.8 C)  SpO2: 99%    General: Awake, no distress.  Able to sit upright, stand and ambulate around the room independently without dizziness or unsteadiness.  Reports feeling okay. CV:  Good peripheral perfusion.  Resp:  Normal effort.  Abd:  No distention.  MSK:  No deformity noted.  Neuro:  No focal deficits appreciated. Cranial nerves II through XII intact 5/5 strength and sensation in all 4 extremities Other:     ED Results / Procedures / Treatments   Labs (all labs ordered are listed, but only abnormal results are  displayed) Labs Reviewed  CBC - Abnormal; Notable for the following components:      Result Value   Hemoglobin 11.5 (*)    HCT 35.0 (*)    All other components within normal limits  URINALYSIS, ROUTINE W REFLEX MICROSCOPIC - Abnormal; Notable for the following components:   Color, Urine YELLOW (*)    APPearance HAZY (*)    Hgb urine dipstick LARGE (*)    All other components within normal limits  COMPREHENSIVE METABOLIC PANEL - Abnormal; Notable for the following components:   Potassium 3.4 (*)    Calcium 8.0 (*)    Total Protein 6.2 (*)    Albumin 3.4 (*)    All other components within normal limits  RESP PANEL BY RT-PCR (RSV, FLU A&B, COVID)  RVPGX2  CBG MONITORING, ED  POC URINE PREG, ED    EKG Sinus rhythm with a rate of 62 bpm.  Normal axis and intervals.  No clear signs of acute ischemia.  RADIOLOGY   Official radiology report(s): No results found.  PROCEDURES and INTERVENTIONS:  Procedures  Medications -  No data to display   IMPRESSION / MDM / ASSESSMENT AND PLAN / ED COURSE  I reviewed the triage vital signs and the nursing notes.  Differential diagnosis includes, but is not limited to, blood loss anemia, hypovolemia, AKI, hypoglycemia, acute cystitis  {Patient presents with symptoms of an acute illness or injury that is potentially life-threatening.  Patient presents with presyncope with a benign workup and suitable for outpatient management.  Normal vitals and exam.  No neurologic deficits or signs of trauma.  Blood work with chronic mild normocytic anemia without signs of blood loss anemia.  Urine with stigmata of her being on a menstrual period, but no infectious features considering the lack of her symptoms.  Normal metabolic panel.  Suitable for outpatient management      FINAL CLINICAL IMPRESSION(S) / ED DIAGNOSES   Final diagnoses:  None     Rx / DC Orders   ED Discharge Orders     None        Note:  This document was prepared using  Dragon voice recognition software and may include unintentional dictation errors.   Delton [place], MD 03/24/23 [phone]

## 2023-03-30 ENCOUNTER — Ambulatory Visit: Payer: MEDICAID | Admitting: Cardiology

## 2024-01-20 ENCOUNTER — Ambulatory Visit
Admission: RE | Admit: 2024-01-20 | Discharge: 2024-01-20 | Disposition: A | Discharge status: Home / Self Care | Payer: MEDICAID

## 2024-01-20 VITALS — BP 142/85 | HR 68 | Temp 98.5°F | Resp 18

## 2024-01-20 DIAGNOSIS — K0889 Other specified disorders of teeth and supporting structures: Secondary | ICD-10-CM

## 2024-01-20 DIAGNOSIS — K029 Dental caries, unspecified: Secondary | ICD-10-CM

## 2024-01-20 MED ORDER — AMOXICILLIN 875 MG PO TABS: 875.0000 mg | ORAL_TABLET | Freq: Two times a day (BID) | ORAL | 0 refills | Status: AC

## 2024-01-20 NOTE — ED Triage Notes (Signed)
 Patient to Urgent Care with complaints of dental pain- right upper. Difficulty eating. Reports she tastes metal.   Symptoms x1 week. Denies any known fevers.   Taking tylenol  and ibuprofen .

## 2024-01-20 NOTE — Discharge Instructions (Addendum)
Take the antibiotic as prescribed.    A dental resource guide is attached.  Please call to make an appointment with a dentist as soon as possible.    Go to the emergency department if you have worsening symptoms.
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
